# Patient Record
Sex: Female | Born: 1945 | Race: Black or African American | Hispanic: No | Smoking: Former smoker
Health system: Southern US, Community
[De-identification: ages and names within clinical notes are randomized; demographics above are authoritative.]

## PROBLEM LIST (undated history)

## (undated) DIAGNOSIS — I639 Cerebral infarction, unspecified: Secondary | ICD-10-CM

## (undated) DIAGNOSIS — M1990 Unspecified osteoarthritis, unspecified site: Secondary | ICD-10-CM

## (undated) DIAGNOSIS — E785 Hyperlipidemia, unspecified: Secondary | ICD-10-CM

## (undated) DIAGNOSIS — W19XXXA Unspecified fall, initial encounter: Secondary | ICD-10-CM

## (undated) DIAGNOSIS — E039 Hypothyroidism, unspecified: Secondary | ICD-10-CM

## (undated) DIAGNOSIS — D649 Anemia, unspecified: Secondary | ICD-10-CM

## (undated) HISTORY — PX: NO PAST SURGERIES: SHX2092

---

## 2001-09-26 ENCOUNTER — Ambulatory Visit (HOSPITAL_COMMUNITY): Admission: RE | Admit: 2001-09-26 | Discharge: 2001-09-26 | Payer: Self-pay | Admitting: *Deleted

## 2001-10-17 ENCOUNTER — Encounter (INDEPENDENT_AMBULATORY_CARE_PROVIDER_SITE_OTHER): Payer: Self-pay | Admitting: Internal Medicine

## 2001-10-17 ENCOUNTER — Ambulatory Visit (HOSPITAL_COMMUNITY): Admission: RE | Admit: 2001-10-17 | Discharge: 2001-10-17 | Payer: Self-pay | Admitting: Internal Medicine

## 2001-12-10 ENCOUNTER — Ambulatory Visit (HOSPITAL_COMMUNITY): Admission: RE | Admit: 2001-12-10 | Discharge: 2001-12-10 | Payer: Self-pay | Admitting: *Deleted

## 2002-10-06 ENCOUNTER — Ambulatory Visit (HOSPITAL_COMMUNITY): Admission: RE | Admit: 2002-10-06 | Discharge: 2002-10-06 | Payer: Self-pay | Admitting: Pulmonary Disease

## 2003-10-14 ENCOUNTER — Ambulatory Visit (HOSPITAL_COMMUNITY): Admission: RE | Admit: 2003-10-14 | Discharge: 2003-10-14 | Payer: Self-pay | Admitting: Pulmonary Disease

## 2005-12-03 ENCOUNTER — Emergency Department (HOSPITAL_COMMUNITY): Admission: EM | Admit: 2005-12-03 | Discharge: 2005-12-03 | Payer: Self-pay | Admitting: Emergency Medicine

## 2006-09-02 ENCOUNTER — Emergency Department (HOSPITAL_COMMUNITY): Admission: EM | Admit: 2006-09-02 | Discharge: 2006-09-02 | Payer: Self-pay | Admitting: Emergency Medicine

## 2008-05-08 ENCOUNTER — Emergency Department (HOSPITAL_COMMUNITY): Admission: EM | Admit: 2008-05-08 | Discharge: 2008-05-08 | Payer: Self-pay | Admitting: Emergency Medicine

## 2011-12-15 DIAGNOSIS — I639 Cerebral infarction, unspecified: Secondary | ICD-10-CM

## 2011-12-15 HISTORY — DX: Cerebral infarction, unspecified: I63.9

## 2011-12-24 ENCOUNTER — Emergency Department (HOSPITAL_COMMUNITY): Payer: Medicare Other

## 2011-12-24 ENCOUNTER — Encounter (HOSPITAL_COMMUNITY): Payer: Self-pay | Admitting: Emergency Medicine

## 2011-12-24 ENCOUNTER — Inpatient Hospital Stay (HOSPITAL_COMMUNITY)
Admission: EM | Admit: 2011-12-24 | Discharge: 2011-12-27 | DRG: 069 | Disposition: A | Discharge status: Home / Self Care | Payer: Medicare Other | Attending: Pulmonary Disease | Admitting: Pulmonary Disease

## 2011-12-24 ENCOUNTER — Other Ambulatory Visit: Payer: Self-pay

## 2011-12-24 DIAGNOSIS — E860 Dehydration: Secondary | ICD-10-CM | POA: Diagnosis present

## 2011-12-24 DIAGNOSIS — R5381 Other malaise: Secondary | ICD-10-CM | POA: Diagnosis not present

## 2011-12-24 DIAGNOSIS — I6330 Cerebral infarction due to thrombosis of unspecified cerebral artery: Secondary | ICD-10-CM | POA: Diagnosis not present

## 2011-12-24 DIAGNOSIS — R197 Diarrhea, unspecified: Secondary | ICD-10-CM | POA: Diagnosis not present

## 2011-12-24 DIAGNOSIS — I651 Occlusion and stenosis of basilar artery: Secondary | ICD-10-CM | POA: Diagnosis not present

## 2011-12-24 DIAGNOSIS — R5383 Other fatigue: Secondary | ICD-10-CM | POA: Diagnosis not present

## 2011-12-24 DIAGNOSIS — G459 Transient cerebral ischemic attack, unspecified: Secondary | ICD-10-CM | POA: Diagnosis not present

## 2011-12-24 DIAGNOSIS — K5289 Other specified noninfective gastroenteritis and colitis: Secondary | ICD-10-CM | POA: Diagnosis not present

## 2011-12-24 DIAGNOSIS — R209 Unspecified disturbances of skin sensation: Secondary | ICD-10-CM | POA: Diagnosis not present

## 2011-12-24 DIAGNOSIS — E039 Hypothyroidism, unspecified: Secondary | ICD-10-CM | POA: Diagnosis not present

## 2011-12-24 DIAGNOSIS — R109 Unspecified abdominal pain: Secondary | ICD-10-CM | POA: Diagnosis not present

## 2011-12-24 DIAGNOSIS — M1990 Unspecified osteoarthritis, unspecified site: Secondary | ICD-10-CM | POA: Diagnosis present

## 2011-12-24 DIAGNOSIS — E785 Hyperlipidemia, unspecified: Secondary | ICD-10-CM | POA: Diagnosis present

## 2011-12-24 DIAGNOSIS — R112 Nausea with vomiting, unspecified: Secondary | ICD-10-CM | POA: Diagnosis not present

## 2011-12-24 DIAGNOSIS — R269 Unspecified abnormalities of gait and mobility: Secondary | ICD-10-CM | POA: Diagnosis present

## 2011-12-24 DIAGNOSIS — R29898 Other symptoms and signs involving the musculoskeletal system: Secondary | ICD-10-CM | POA: Diagnosis present

## 2011-12-24 DIAGNOSIS — R1110 Vomiting, unspecified: Secondary | ICD-10-CM

## 2011-12-24 DIAGNOSIS — I639 Cerebral infarction, unspecified: Secondary | ICD-10-CM

## 2011-12-24 DIAGNOSIS — I6529 Occlusion and stenosis of unspecified carotid artery: Secondary | ICD-10-CM | POA: Diagnosis not present

## 2011-12-24 DIAGNOSIS — I6350 Cerebral infarction due to unspecified occlusion or stenosis of unspecified cerebral artery: Secondary | ICD-10-CM | POA: Diagnosis not present

## 2011-12-24 HISTORY — DX: Unspecified osteoarthritis, unspecified site: M19.90

## 2011-12-24 HISTORY — DX: Hyperlipidemia, unspecified: E78.5

## 2011-12-24 LAB — BASIC METABOLIC PANEL
BUN: 22 mg/dL (ref 6–23)
CO2: 24 mEq/L (ref 19–32)
Chloride: 99 mEq/L (ref 96–112)
Creatinine, Ser: 1.15 mg/dL — ABNORMAL HIGH (ref 0.50–1.10)
GFR calc Af Amer: 56 mL/min — ABNORMAL LOW (ref >90)
Potassium: 3.9 mEq/L (ref 3.5–5.1)

## 2011-12-24 LAB — CBC
HCT: 35.1 % — ABNORMAL LOW (ref 36.0–46.0)
Hemoglobin: 11 g/dL — ABNORMAL LOW (ref 12.0–15.0)
MCHC: 31.3 g/dL (ref 30.0–36.0)
RBC: 3.74 MIL/uL — ABNORMAL LOW (ref 3.87–5.11)
RDW: 14.9 % (ref 11.5–15.5)

## 2011-12-24 LAB — URINALYSIS, ROUTINE W REFLEX MICROSCOPIC
Glucose, UA: NEGATIVE mg/dL
Leukocytes, UA: NEGATIVE
Nitrite: NEGATIVE
Protein, ur: NEGATIVE mg/dL
pH: 5.5 (ref 5.0–8.0)

## 2011-12-24 LAB — DIFFERENTIAL
Eosinophils Absolute: 0 10*3/uL (ref 0.0–0.7)
Eosinophils Relative: 0 % (ref 0–5)
Lymphocytes Relative: 4 % — ABNORMAL LOW (ref 12–46)
Monocytes Absolute: 0.2 10*3/uL (ref 0.1–1.0)
Neutrophils Relative %: 94 % — ABNORMAL HIGH (ref 43–77)

## 2011-12-24 MED ORDER — ONDANSETRON HCL 4 MG PO TABS
4.0000 mg | ORAL_TABLET | ORAL | Status: DC | PRN
Start: 2011-12-24 — End: 2011-12-27

## 2011-12-24 MED ORDER — SODIUM CHLORIDE 0.9 % IV SOLN
INTRAVENOUS | Status: DC
  Administered 2011-12-24 – 2011-12-27 (×3): via INTRAVENOUS

## 2011-12-24 MED ORDER — ONDANSETRON HCL 4 MG/2ML IJ SOLN
4.0000 mg | INTRAMUSCULAR | Status: DC | PRN
  Administered 2011-12-24: 4 mg via INTRAVENOUS
  Filled 2011-12-24: qty 2

## 2011-12-24 MED ORDER — PNEUMOCOCCAL VAC POLYVALENT 25 MCG/0.5ML IJ INJ
0.5000 mL | INJECTION | INTRAMUSCULAR | Status: AC
  Administered 2011-12-25: 0.5 mL via INTRAMUSCULAR
  Filled 2011-12-24: qty 0.5

## 2011-12-24 MED ORDER — ONDANSETRON HCL 4 MG/2ML IJ SOLN
4.0000 mg | Freq: Once | INTRAMUSCULAR | Status: AC
  Administered 2011-12-24: 4 mg via INTRAVENOUS
  Filled 2011-12-24: qty 2

## 2011-12-24 MED ORDER — NAPROXEN 250 MG PO TABS: 250.0000 mg | ORAL_TABLET | Freq: Three times a day (TID) | ORAL | Status: DC | PRN

## 2011-12-24 MED ORDER — DIPHENHYDRAMINE HCL 25 MG PO CAPS
25.0000 mg | ORAL_CAPSULE | Freq: Four times a day (QID) | ORAL | Status: DC | PRN
  Filled 2011-12-24: qty 1

## 2011-12-24 MED ORDER — ASPIRIN 81 MG PO CHEW
81.0000 mg | CHEWABLE_TABLET | Freq: Every day | ORAL | Status: DC
  Administered 2011-12-24 – 2011-12-27 (×4): 81 mg via ORAL
  Filled 2011-12-24 (×4): qty 1

## 2011-12-24 MED ORDER — ADULT MULTIVITAMIN W/MINERALS CH
1.0000 | ORAL_TABLET | Freq: Every day | ORAL | Status: DC
  Administered 2011-12-24 – 2011-12-27 (×4): 1 via ORAL
  Filled 2011-12-24 (×4): qty 1

## 2011-12-24 MED ORDER — SODIUM CHLORIDE 0.9 % IV SOLN
Freq: Once | INTRAVENOUS | Status: AC
  Administered 2011-12-24: 1000 mL via INTRAVENOUS

## 2011-12-24 MED ORDER — ENOXAPARIN SODIUM 40 MG/0.4ML ~~LOC~~ SOLN
40.0000 mg | SUBCUTANEOUS | Status: DC
  Administered 2011-12-24 – 2011-12-26 (×3): 40 mg via SUBCUTANEOUS
  Filled 2011-12-24 (×3): qty 0.4

## 2011-12-24 MED ORDER — SODIUM CHLORIDE 0.9 % IJ SOLN
INTRAMUSCULAR | Status: AC
  Administered 2011-12-24: 10 mL
  Filled 2011-12-24: qty 3

## 2011-12-24 MED ORDER — LOPERAMIDE HCL 2 MG PO CAPS
4.0000 mg | ORAL_CAPSULE | Freq: Once | ORAL | Status: AC
  Administered 2011-12-24: 4 mg via ORAL
  Filled 2011-12-24: qty 2

## 2011-12-24 MED ORDER — ACETAMINOPHEN 500 MG PO TABS
1000.0000 mg | ORAL_TABLET | Freq: Four times a day (QID) | ORAL | Status: DC | PRN
  Administered 2011-12-24 – 2011-12-26 (×3): 1000 mg via ORAL
  Filled 2011-12-24 (×3): qty 2

## 2011-12-24 MED ORDER — ONDANSETRON HCL 4 MG/2ML IJ SOLN
4.0000 mg | INTRAMUSCULAR | Status: DC | PRN
  Administered 2011-12-24 – 2011-12-26 (×4): 4 mg via INTRAVENOUS
  Filled 2011-12-24 (×4): qty 2

## 2011-12-24 NOTE — Progress Notes (Signed)
NAME:  Alexis Ross, Alexis Ross             ACCOUNT NO.:  [id number]  MEDICAL RECORD NO.:  [id number]  LOCATION:  A320                          FACILITY:  [facility]  PHYSICIAN:  Lino Wickliff D. Felecia Shelling, MD   DATE OF BIRTH:  1946-01-10  DATE OF PROCEDURE: DATE OF DISCHARGE:                                PROGRESS NOTE   SUBJECTIVE:  Left-sided weakness, nausea, and vomiting.  HISTORY OF PRESENT ILLNESS:  This is a 66 year old female patient with history of hyperlipidemia and osteoarthritis, came to emergency room with above complaint.  She presented with the complaint of left-sided weakness of over 12 hours.  She also reported to have nausea, vomiting, and diarrhea.  The patient was evaluated in the emergency room and CT scan was negative.  Her baseline labs were within the normal limits. The patient, however, was admitted for further evaluation.  REVIEW OF SYSTEMS:  No fever, chills, cough, chest pain, shortness of breath, abdominal pain, dysuria, urgency, or frequency of urination.  PAST MEDICAL HISTORY: 1. Osteoarthritis. 2. Hyperlipidemia.  CURRENT MEDICATIONS:  Home medications: 1. Tylenol 1000 mg q.6 hours p.r.n. 2. Benadryl p.r.n. for allergy. 3. Multivitamin 1 tablet daily. 4. Naproxen 220 mg q.8 hours p.r.n.  SOCIAL HISTORY:  The patient has history of remote tobacco smoke.  No history of alcohol or substance abuse.  FAMILY HISTORY:  No known major illness in the family.  PHYSICAL EXAMINATION:  GENERAL/VITAL SIGNS:  The patient is alert, awake, and sick looking with vitals blood pressure 102/57, pulse 87, respiratory rate 18, temperature 98.22 degrees Fahrenheit. HEENT:  Pupils are equal and reactive. NECK:  Supple. CHEST:  Clear lung fields, good air entry. CARDIOVASCULAR SYSTEM:  First and second heart sounds heard.  No murmur, no gallop. ABDOMEN:  Soft and lax.  Bowel sound is positive.  No mass or organomegaly. EXTREMITIES:  The patient has mild left lower extremity  weakness.  LABORATORY DATA ON ADMISSION:  Troponin 0.3.  BMP, sodium 136, potassium 3.9, chloride 99, carbon dioxide 24, glucose 113, BUN 12, creatinine 1.15, calcium 9.9.  CBC, WBC 8.1, hemoglobin 11.0, hematocrit 35.1, and platelets 251.  ASSESSMENT: 1. Left-sided weakness, to rule out stroke and evaluation versus     transient ischemic attack. 2. Gastroenteritis. 3. History of hyperlipidemia.  PLAN:  We will admit the patient under telemetry.  We will do neuro checks.  We will schedule for MRI of the brain.  We will keep the patient on clear liquid diet.  We will gradually rehydrate the patient.     Daisi Kentner D. Felecia Shelling, MD     TDF/MEDQ  D:  12/24/2011  T:  12/24/2011  Job:  [id number]

## 2011-12-24 NOTE — ED Notes (Signed)
Pt given water to drink. 

## 2011-12-24 NOTE — ED Notes (Signed)
Pt reports started feeling nauseated last night then progressed to abd pain, vomiting, and diarrhea.  Reports approx 10pm last night left side of body went numb for approx an hour.  Pt's left leg weaker than right .  Grips equal.  Pupils equal and reactive, face symmetrical.  PT pleasant.

## 2011-12-24 NOTE — ED Notes (Signed)
Attempted to assess how pt swallowed a cracker.  Pt ate the crackers and swallowed without difficulty but vomited a few minutes later.  No change in lung sounds.

## 2011-12-24 NOTE — ED Notes (Signed)
Patient c/o emesis, diarrhea, and abdominal pain since last night. Patient report chills as well.

## 2011-12-24 NOTE — ED Provider Notes (Cosign Needed Addendum)
History  This chart was scribed for Ward Givens, MD by Bennett Scrape. This patient was seen in room [facility] and the patient's care was started at 11:16AM.  CSN: [id number]  Arrival date & time 12/24/11  1012   First MD Initiated Contact with Patient 12/24/11 1043      Chief Complaint  Patient presents with  . Emesis    Patient is a 66 y.o. female presenting with abdominal pain. The history is provided by the patient. No language interpreter was used.  Abdominal Pain The primary symptoms of the illness include abdominal pain. The current episode started 13 to 24 hours ago. The onset of the illness was gradual. The problem has been gradually improving.  The abdominal pain began 13 to24 hours ago. The pain came on gradually. The abdominal pain has been gradually improving since its onset. The abdominal pain is generalized. The abdominal pain does not radiate. The abdominal pain is relieved by nothing.  The patient states that she believes she is currently not pregnant. Significant associated medical issues do not include inflammatory bowel disease, diabetes or diverticulitis.    Alexis Ross is a 66 y.o. female who presents to the Emergency Department relating about 5:30 yesterday evening she started feeling nauseated, then had some non-radiating generalized abdominal pain followed by diarrhea. She states the abdominal pain gets worse when she needs to have diarrhea which is watery.  She lists emesis, diarrhea and mild HA as associated symptoms. She reports taking 2 tylenol with mild improvement in symptoms (headache). She denies any modifying factors. She also states about 11 pm she had an  episode of left-sided numbness and weaknesswith associated facial aches and chills that lasted for over one hour. She states that she could not move her left arm or left leg during that time. She denies numbness or weakness in her face.  She denies having any previous episodes of similar numbness.  She reports that her left leg is still weak but that her left hand is close to baseline. She states that she needed assistance walking to the bathroom which is not at baseline for her. She denies chest pain and fever as associated symptoms. She does report she had chills and had to hold her teeth clamped shut so they wouldn't clatter. States she was "freezing". She denies having any sick contacts at home with similar symptoms. .    Pt's PCP is Dr. Juanetta Gosling.   Past Medical History  Diagnosis Date  . Hyperlipidemia     History reviewed. No pertinent past surgical history.  No family history on file. Pt states that brother had brain CA.  History  Substance Use Topics  . Smoking status: Former Games developer  . Smokeless tobacco: Not on file  . Alcohol Use: No  lives at home alone  Review of Systems  Gastrointestinal: Positive for abdominal pain.    Allergies  Review of patient's allergies indicates no known allergies.  Home Medications   Current Outpatient Rx  Name Route Sig Dispense Refill  . ACETAMINOPHEN 500 MG PO TABS Oral Take 1,000 mg by mouth every 6 (six) hours as needed. For pain    . DIPHENHYDRAMINE HCL 25 MG PO TABS Oral Take 25 mg by mouth every 6 (six) hours as needed. For allergies    . ADULT MULTIVITAMIN W/MINERALS CH Oral Take 1 tablet by mouth daily.    Marland Kitchen NAPROXEN SODIUM 220 MG PO TABS Oral Take 220 mg by mouth every 8 (eight) hours as needed.  Arthritis pain      Triage Vitals: BP 153/70  Pulse 99  Temp(Src) 98.6 F (37 C) (Oral)  Resp 18  Ht 5\' 6"  (1.676 m)  Wt 185 lb (83.915 kg)  BMI 29.86 kg/m2  SpO2 100%  Vital signs normal    Physical Exam  Nursing note and vitals reviewed. Constitutional: She is oriented to person, place, and time. She appears well-developed and well-nourished.  HENT:  Head: Normocephalic and atraumatic.  Right Ear: External ear normal.  Left Ear: External ear normal.       Dry mucous membranes  Eyes: Conjunctivae and EOM are  normal.  Neck: Normal range of motion. Neck supple.  Cardiovascular: Normal rate, regular rhythm, normal heart sounds and intact distal pulses.  Exam reveals no gallop and no friction rub.   No murmur heard. Pulmonary/Chest: Effort normal and breath sounds normal. No respiratory distress. She has no wheezes. She has no rales. She exhibits no tenderness.  Abdominal: Soft. Bowel sounds are normal. She exhibits no distension. There is tenderness. There is no rebound and no guarding.       Mild diffuse especially lower abdomen no guarding or rebound  Musculoskeletal: Normal range of motion. She exhibits no edema.  Neurological: She is alert and oriented to person, place, and time. No cranial nerve deficit.       Patient has mild pronator drift on the left. Patient has difficulty holding her left leg up against gravity and also maintaining it for calcified. She does not have Babinski on either side. She has no obvious facial cranial nerve deficit.  Skin: Skin is warm and dry.  Psychiatric: She has a normal mood and affect. Her behavior is normal.    ED Course  Procedures (including critical care time)   Medications  ondansetron (ZOFRAN) injection 4 mg (4 mg Intravenous Given 12/24/11 1203)  ondansetron (ZOFRAN) injection 4 mg (not administered)  loperamide (IMODIUM) capsule 4 mg (4 mg Oral Given 12/24/11 1203)  0.9 %  sodium chloride infusion (1000 mL Intravenous New Bag/Given 12/24/11 1204)      DIAGNOSTIC STUDIES: Oxygen Saturation is 100% on room air, normal by my interpretation.    COORDINATION OF CARE: 11:30AM-Discussed treatment plan with pt and pt agreed to plan.  1:05PM-Pt rechecked and is feeling better. Dicussed possible admission with pt and pt agree to admission.  2:09PM-Discussed admission to the hospital and pt agreed. She will be admitted under Dr. Felecia Shelling. Will continue to treat emesis and diarrhea in the meantime. A MRI is scheduled for tomorrow morning.  14:08 Dr Felecia Shelling  will admit here and get MRI tomorrow   Results for orders placed during the hospital encounter of 12/24/11  CBC      Component Value Range   WBC 8.1  4.0 - 10.5 (K/uL)   RBC 3.74 (*) 3.87 - 5.11 (MIL/uL)   Hemoglobin 11.0 (*) 12.0 - 15.0 (g/dL)   HCT 16.1 (*) 09.6 - 46.0 (%)   MCV 93.9  78.0 - 100.0 (fL)   MCH 29.4  26.0 - 34.0 (pg)   MCHC 31.3  30.0 - 36.0 (g/dL)   RDW 04.5  40.9 - 81.1 (%)   Platelets 251  150 - 400 (K/uL)  DIFFERENTIAL      Component Value Range   Neutrophils Relative 94 (*) 43 - 77 (%)   Neutro Abs 7.6  1.7 - 7.7 (K/uL)   Lymphocytes Relative 4 (*) 12 - 46 (%)   Lymphs Abs 0.3 (*) 0.7 -  4.0 (K/uL)   Monocytes Relative 2 (*) 3 - 12 (%)   Monocytes Absolute 0.2  0.1 - 1.0 (K/uL)   Eosinophils Relative 0  0 - 5 (%)   Eosinophils Absolute 0.0  0.0 - 0.7 (K/uL)   Basophils Relative 0  0 - 1 (%)   Basophils Absolute 0.0  0.0 - 0.1 (K/uL)  BASIC METABOLIC PANEL      Component Value Range   Sodium 136  135 - 145 (mEq/L)   Potassium 3.9  3.5 - 5.1 (mEq/L)   Chloride 99  96 - 112 (mEq/L)   CO2 24  19 - 32 (mEq/L)   Glucose, Bld 113 (*) 70 - 99 (mg/dL)   BUN 22  6 - 23 (mg/dL)   Creatinine, Ser 4.09 (*) 0.50 - 1.10 (mg/dL)   Calcium 9.9  8.4 - 81.1 (mg/dL)   GFR calc non Af Amer 48 (*) >90 (mL/min)   GFR calc Af Amer 56 (*) >90 (mL/min)  URINALYSIS, ROUTINE W REFLEX MICROSCOPIC      Component Value Range   Color, Urine YELLOW  YELLOW    APPearance CLEAR  CLEAR    Specific Gravity, Urine 1.025  1.005 - 1.030    pH 5.5  5.0 - 8.0    Glucose, UA NEGATIVE  NEGATIVE (mg/dL)   Hgb urine dipstick NEGATIVE  NEGATIVE    Bilirubin Urine NEGATIVE  NEGATIVE    Ketones, ur NEGATIVE  NEGATIVE (mg/dL)   Protein, ur NEGATIVE  NEGATIVE (mg/dL)   Urobilinogen, UA 0.2  0.0 - 1.0 (mg/dL)   Nitrite NEGATIVE  NEGATIVE    Leukocytes, UA NEGATIVE  NEGATIVE   APTT      Component Value Range   aPTT 26  24 - 37 (seconds)  PROTIME-INR      Component Value Range   Prothrombin  Time 14.7  11.6 - 15.2 (seconds)   INR 1.13  0.00 - 1.49   TROPONIN I      Component Value Range   Troponin I <0.30  <0.30 (ng/mL)    Laboratory interpretation all normal except mild anemia, renal insuffic   Ct Head Wo Contrast  12/24/2011  *RADIOLOGY REPORT*  Clinical Data: Numbness and weakness.  CT HEAD WITHOUT CONTRAST  Technique:  Contiguous axial images were obtained from the base of the skull through the vertex without contrast.  Comparison: None.  Findings: The ventricles are in the midline without mass effect or shift.  They are normal in size and configuration for age.  No extra-axial fluid collections are identified.  Mild patchy periventricular white matter disease is likely microvascular ischemic change.  No definite CT findings for acute hemispheric infarction or intracranial hemorrhage.  The brainstem and cerebellum grossly normal.  No mass lesions.  The bony structures are intact.  The paranasal sinuses and mastoid air cells are clear.  The globes are intact.  IMPRESSION: No acute intracranial findings or mass lesions.  Original Report Authenticated By: P. Loralie Champagne, M.D.    Date: 12/24/2011  Rate: 93  Rhythm: normal sinus rhythm  QRS Axis: normal  Intervals: normal  ST/T Wave abnormalities: nonspecific T wave changes  Conduction Disutrbances:none  Narrative Interpretation:   Old EKG Reviewed: none available      1. Stroke   2. Vomiting and diarrhea   3. Dehydration       MDM   I personally performed the services described in this documentation, which was scribed in my presence. The recorded information has been reviewed  and considered. Devoria Albe, MD, FACEP          Ward Givens, MD 12/24/11 1527  Ward Givens, MD 12/24/11 [phone]

## 2011-12-25 ENCOUNTER — Inpatient Hospital Stay (HOSPITAL_COMMUNITY): Payer: Medicare Other

## 2011-12-25 LAB — LIPID PANEL
HDL: 71 mg/dL (ref >39)
LDL Cholesterol: 262 mg/dL — ABNORMAL HIGH (ref 0–99)
Total CHOL/HDL Ratio: 5.1 RATIO
Triglycerides: 135 mg/dL (ref <150)
VLDL: 27 mg/dL (ref 0–40)

## 2011-12-25 MED ORDER — PANTOPRAZOLE SODIUM 40 MG PO PACK
20.0000 mg | PACK | Freq: Every day | ORAL | Status: DC
  Administered 2011-12-25 – 2011-12-26 (×2): 20 mg
  Filled 2011-12-25 (×5): qty 20

## 2011-12-25 MED ORDER — PANTOPRAZOLE SODIUM 20 MG PO TBEC
20.0000 mg | DELAYED_RELEASE_TABLET | Freq: Every day | ORAL | Status: DC
  Filled 2011-12-25: qty 1

## 2011-12-25 MED ORDER — LOPERAMIDE HCL 2 MG PO CAPS
2.0000 mg | ORAL_CAPSULE | ORAL | Status: DC | PRN
  Administered 2011-12-25: 2 mg via ORAL
  Filled 2011-12-25: qty 1

## 2011-12-25 MED ORDER — SODIUM CHLORIDE 0.9 % IJ SOLN
INTRAMUSCULAR | Status: AC
  Administered 2011-12-25: 19:00:00
  Filled 2011-12-25: qty 3

## 2011-12-25 MED ORDER — PROMETHAZINE HCL 25 MG/ML IJ SOLN
12.5000 mg | INTRAMUSCULAR | Status: DC | PRN
  Administered 2011-12-25 – 2011-12-26 (×2): 12.5 mg via INTRAVENOUS
  Filled 2011-12-25 (×2): qty 1

## 2011-12-25 MED ORDER — PROMETHAZINE HCL 25 MG RE SUPP: 25.0000 mg | Freq: Once | RECTAL | Status: DC

## 2011-12-25 NOTE — Evaluation (Signed)
Physical Therapy Evaluation Patient Details Name: Alexis Ross MRN: [id number] DOB: 1946-02-12 Today's Date: 12/25/2011  Problem List: There is no problem list on file for this patient.   Past Medical History:  Past Medical History  Diagnosis Date  . Hyperlipidemia   . Arthritis    Past Surgical History: History reviewed. No pertinent past surgical history.  PT Assessment/Plan/Recommendation PT Assessment Clinical Impression Statement: very pleasant and cooperative pt who had been totally independent PTA...now with significant L hemiparesis and difficulty walking...recommend SNF at d/c ...(might be suitable for inpatient rehab) PT Recommendation/Assessment: Patient will need skilled PT in the acute care venue PT Problem List: Decreased strength;Decreased activity tolerance;Decreased mobility;Decreased coordination;Decreased knowledge of use of DME;Decreased safety awareness Barriers to Discharge: Decreased caregiver support Barriers to Discharge Comments: has supportive daughter, but is employed and not always available to assist pt PT Therapy Diagnosis : Abnormality of gait;Hemiplegia dominant side PT Plan PT Frequency: Min 5X/week PT Treatment/Interventions: DME instruction;Gait training;Functional mobility training;Therapeutic activities;Therapeutic exercise;Neuromuscular re-education;Patient/family education PT Recommendation Follow Up Recommendations: Inpatient Rehab;Skilled nursing facility Equipment Recommended: Defer to next venue PT Goals  Acute Rehab PT Goals PT Goal Formulation: With patient Pt will Ambulate: 51 - 150 feet;with supervision;with least restrictive assistive device PT Goal: Ambulate - Progress: Goal set today  PT Evaluation Precautions/Restrictions  Precautions Precautions: Fall Required Braces or Orthoses: No Restrictions Weight Bearing Restrictions: No Prior Functioning  Home Living Lives With: Alone Receives Help From: Family Type of  Home: Apartment Home Layout: One level Home Access: Level entry Firefighter: Standard Bathroom Accessibility: Yes How Accessible: Accessible via walker Home Adaptive Equipment: None Prior Function Level of Independence: Independent with basic ADLs;Independent with gait;Independent with transfers;Independent with homemaking with ambulation Driving: No Vocation: Retired Producer, television/film/video: Awake/alert Overall Cognitive Status: Appears within functional limits for tasks assessed Orientation Level: Oriented X4 Sensation/Coordination Sensation Light Touch: Appears Intact Stereognosis: Not tested Hot/Cold: Not tested Proprioception: Appears Intact Coordination Gross Motor Movements are Fluid and Coordinated: No Fine Motor Movements are Fluid and Coordinated: Not tested Coordination and Movement Description: mild decrease in coordination of L estremeties...very slow patterns of motion Finger Nose Finger Test: slight misplacement LUE Heel Shin Test: NT Extremity Assessment RUE Assessment RUE Assessment: Within Functional Limits LUE Assessment LUE Assessment: Not tested RLE Assessment RLE Assessment: Within Functional Limits LLE Strength LLE Overall Strength Comments: 3-/5 Mobility (including Balance) Bed Mobility Bed Mobility: No Transfers Transfers: Yes Sit to Stand: 6: Modified independent (Device/Increase time) Stand to Sit: 6: Modified independent (Device/Increase time) Ambulation/Gait Ambulation/Gait: Yes Ambulation/Gait Assistance: 4: Min assist Ambulation/Gait Assistance Details (indicate cue type and reason): has difficulty moving LLE at times with very slow patterns of motion...decreased LLE awareness Ambulation Distance (Feet): 30 Feet Assistive device: Rolling walker Gait Pattern: Decreased hip/knee flexion - left;Trunk flexed Gait velocity: very slow and labored gait Stairs: No Wheelchair Mobility Wheelchair Mobility: No    Posture/Postural Control Posture/Postural Control: No significant limitations Balance Balance Assessed: Yes Static Sitting Balance Static Sitting - Level of Assistance: 7: Independent Exercise    End of Session PT - End of Session Equipment Utilized During Treatment: Gait belt Activity Tolerance: Patient tolerated treatment well Patient left: in chair;with call bell in reach;with family/visitor present General Behavior During Session: [facility] for tasks performed Cognition: [facility] for tasks performed  Konrad Penta 12/25/2011, 10:48 AM

## 2011-12-25 NOTE — Consult Note (Signed)
Reason for Consult: Referring Physician:   JERYN Ross is an 66 y.o. female.  HPI:  Past Medical History  Diagnosis Date  . Hyperlipidemia   . Arthritis     History reviewed. No pertinent past surgical history.  No family history on file.  Social History:  reports that she has quit smoking. She does not have any smokeless tobacco history on file. She reports that she does not drink alcohol or use illicit drugs.  Allergies: No Known Allergies  Medications:  Prior to Admission medications   Medication Sig Start Date End Date Taking? Authorizing Provider  acetaminophen (TYLENOL) 500 MG tablet Take 1,000 mg by mouth every 6 (six) hours as needed. For pain   Yes Historical Provider, MD  diphenhydrAMINE (BENADRYL) 25 MG tablet Take 25 mg by mouth every 6 (six) hours as needed. For allergies   Yes Historical Provider, MD  Multiple Vitamin (MULITIVITAMIN WITH MINERALS) TABS Take 1 tablet by mouth daily.   Yes Historical Provider, MD  naproxen sodium (ANAPROX) 220 MG tablet Take 220 mg by mouth every 8 (eight) hours as needed. Arthritis pain   Yes Historical Provider, MD   Scheduled Meds:   . sodium chloride   Intravenous Once  . aspirin  81 mg Oral Daily  . enoxaparin (LOVENOX) injection  40 mg Subcutaneous Q24H  . loperamide  4 mg Oral Once  . mulitivitamin with minerals  1 tablet Oral Daily  . ondansetron  4 mg Intravenous Once  . pneumococcal 23 valent vaccine  0.5 mL Intramuscular Tomorrow-1000  . sodium chloride       Continuous Infusions:   . sodium chloride 75 mL/hr at 12/25/11 0631   PRN Meds:.acetaminophen, diphenhydrAMINE, naproxen, ondansetron, ondansetron, DISCONTD: ondansetron (ZOFRAN) IV'  Results for orders placed during the hospital encounter of 12/24/11 (from the past 48 hour(s))  CBC     Status: Abnormal   Collection Time   12/24/11 11:07 AM      Component Value Range Comment   WBC 8.1  4.0 - 10.5 (K/uL)    RBC 3.74 (*) 3.87 - 5.11 (MIL/uL)    Hemoglobin 11.0 (*) 12.0 - 15.0 (g/dL)    HCT 16.1 (*) 09.6 - 46.0 (%)    MCV 93.9  78.0 - 100.0 (fL)    MCH 29.4  26.0 - 34.0 (pg)    MCHC 31.3  30.0 - 36.0 (g/dL)    RDW 04.5  40.9 - 81.1 (%)    Platelets 251  150 - 400 (K/uL)   DIFFERENTIAL     Status: Abnormal   Collection Time   12/24/11 11:07 AM      Component Value Range Comment   Neutrophils Relative 94 (*) 43 - 77 (%)    Neutro Abs 7.6  1.7 - 7.7 (K/uL)    Lymphocytes Relative 4 (*) 12 - 46 (%)    Lymphs Abs 0.3 (*) 0.7 - 4.0 (K/uL)    Monocytes Relative 2 (*) 3 - 12 (%)    Monocytes Absolute 0.2  0.1 - 1.0 (K/uL)    Eosinophils Relative 0  0 - 5 (%)    Eosinophils Absolute 0.0  0.0 - 0.7 (K/uL)    Basophils Relative 0  0 - 1 (%)    Basophils Absolute 0.0  0.0 - 0.1 (K/uL)   BASIC METABOLIC PANEL     Status: Abnormal   Collection Time   12/24/11 11:07 AM      Component Value Range Comment   Sodium 136  135 - 145 (mEq/L)    Potassium 3.9  3.5 - 5.1 (mEq/L)    Chloride 99  96 - 112 (mEq/L)    CO2 24  19 - 32 (mEq/L)    Glucose, Bld 113 (*) 70 - 99 (mg/dL)    BUN 22  6 - 23 (mg/dL)    Creatinine, Ser 0.98 (*) 0.50 - 1.10 (mg/dL)    Calcium 9.9  8.4 - 10.5 (mg/dL)    GFR calc non Af Amer 48 (*) >90 (mL/min)    GFR calc Af Amer 56 (*) >90 (mL/min)   TROPONIN I     Status: Normal   Collection Time   12/24/11 11:07 AM      Component Value Range Comment   Troponin I <0.30  <0.30 (ng/mL)   URINALYSIS, ROUTINE W REFLEX MICROSCOPIC     Status: Normal   Collection Time   12/24/11 11:15 AM      Component Value Range Comment   Color, Urine YELLOW  YELLOW     APPearance CLEAR  CLEAR     Specific Gravity, Urine 1.025  1.005 - 1.030     pH 5.5  5.0 - 8.0     Glucose, UA NEGATIVE  NEGATIVE (mg/dL)    Hgb urine dipstick NEGATIVE  NEGATIVE     Bilirubin Urine NEGATIVE  NEGATIVE     Ketones, ur NEGATIVE  NEGATIVE (mg/dL)    Protein, ur NEGATIVE  NEGATIVE (mg/dL)    Urobilinogen, UA 0.2  0.0 - 1.0 (mg/dL)    Nitrite NEGATIVE   NEGATIVE     Leukocytes, UA NEGATIVE  NEGATIVE  MICROSCOPIC NOT DONE ON URINES WITH NEGATIVE PROTEIN, BLOOD, LEUKOCYTES, NITRITE, OR GLUCOSE <1000 mg/dL.  APTT     Status: Normal   Collection Time   12/24/11 11:33 AM      Component Value Range Comment   aPTT 26  24 - 37 (seconds)   PROTIME-INR     Status: Normal   Collection Time   12/24/11 11:33 AM      Component Value Range Comment   Prothrombin Time 14.7  11.6 - 15.2 (seconds)    INR 1.13  0.00 - 1.49      Ct Head Wo Contrast  12/24/2011  *RADIOLOGY REPORT*  Clinical Data: Numbness and weakness.  CT HEAD WITHOUT CONTRAST  Technique:  Contiguous axial images were obtained from the base of the skull through the vertex without contrast.  Comparison: None.  Findings: The ventricles are in the midline without mass effect or shift.  They are normal in size and configuration for age.  No extra-axial fluid collections are identified.  Mild patchy periventricular white matter disease is likely microvascular ischemic change.  No definite CT findings for acute hemispheric infarction or intracranial hemorrhage.  The brainstem and cerebellum grossly normal.  No mass lesions.  The bony structures are intact.  The paranasal sinuses and mastoid air cells are clear.  The globes are intact.  IMPRESSION: No acute intracranial findings or mass lesions.  Original Report Authenticated By: P. Loralie Champagne, M.D.    Review of Systems  Constitutional: Negative.   HENT: Negative.   Respiratory: Negative.   Genitourinary: Negative.   Musculoskeletal: Negative.   Skin: Negative.   Endo/Heme/Allergies: Negative.    Blood pressure 108/69, pulse 83, temperature 98.2 F (36.8 C), temperature source Oral, resp. rate 20, height 5\' 6"  (1.676 m), weight 83.915 kg (185 lb), SpO2 93.00%. Physical Exam  Assessment/Plan: See dictation  Saben Donigan 12/25/2011, 9:17  AM

## 2011-12-25 NOTE — Progress Notes (Signed)
   CARE MANAGEMENT NOTE 12/25/2011  Patient:  Alexis Ross, Alexis Ross   Account Number:  [id number]  Date Initiated:  12/25/2011  Documentation initiated by:  Sharrie Rothman  Subjective/Objective Assessment:   Pt admitted with diarrhea and vomiting. Pt lives alone at home.     Action/Plan:   CM spoke with pt. No needs at this time   Anticipated DC Date:  12/29/2011   Anticipated DC Plan:  HOME/SELF CARE      DC Planning Services  CM consult      Choice offered to / List presented to:             Status of service:  In process, will continue to follow Medicare Important Message given?   (If response is "NO", the following Medicare IM given date fields will be blank) Date Medicare IM given:   Date Additional Medicare IM given:    Discharge Disposition:  HOME/SELF CARE  Per UR Regulation:    Comments:  12/25/11 Alexis Queen, RN BSN Care Management Pt admitted with vomiting and diarrhea. Pt states no discharge needs at this time. CM will continue to follow.

## 2011-12-26 MED ORDER — SODIUM CHLORIDE 0.9 % IJ SOLN
INTRAMUSCULAR | Status: AC
  Administered 2011-12-26: 18:00:00
  Filled 2011-12-26: qty 3

## 2011-12-26 MED ORDER — CLOPIDOGREL BISULFATE 75 MG PO TABS
75.0000 mg | ORAL_TABLET | Freq: Every day | ORAL | Status: DC
  Administered 2011-12-27: 75 mg via ORAL
  Filled 2011-12-26: qty 1

## 2011-12-26 MED ORDER — LEVOTHYROXINE SODIUM 50 MCG PO TABS
50.0000 ug | ORAL_TABLET | Freq: Every day | ORAL | Status: DC
  Administered 2011-12-27: 50 ug via ORAL
  Filled 2011-12-26: qty 1

## 2011-12-26 MED ORDER — SIMVASTATIN 20 MG PO TABS
20.0000 mg | ORAL_TABLET | Freq: Every day | ORAL | Status: DC
  Administered 2011-12-26: 20 mg via ORAL
  Filled 2011-12-26: qty 1

## 2011-12-26 NOTE — Progress Notes (Signed)
Subjective: Interval History:  Objective: Vital signs in last 24 hours: Temp:  [97.8 F (36.6 C)-98.2 F (36.8 C)] 98.2 F (36.8 C) (03/12 0559) Pulse Rate:  [69-90] 90  (03/12 0559) Resp:  [20] 20  (03/12 0559) BP: (101-119)/(66-72) 101/66 mmHg (03/12 0559) SpO2:  [98 %-100 %] 98 % (03/12 0559)  Intake/Output from previous day: 03/11 0701 - 03/12 0700 In: 2117.5 [P.O.:1180; I.V.:937.5] Out: 1000 [Urine:1000] Intake/Output this shift:   Nutritional status: Clear Liquid    Lab Results:  Basename 12/24/11 1107  WBC 8.1  HGB 11.0*  HCT 35.1*  PLT 251  NA 136  K 3.9  CL 99  CO2 24  GLUCOSE 113*  BUN 22  CREATININE 1.15*  CALCIUM 9.9  LABA1C --   Lipid Panel  Basename 12/25/11 1030  CHOL 360*  TRIG 135  HDL 71  CHOLHDL 5.1  VLDL 27  LDLCALC 161*    Studies/Results: Ct Head Wo Contrast  12/24/2011  *RADIOLOGY REPORT*  Clinical Data: Numbness and weakness.  CT HEAD WITHOUT CONTRAST  Technique:  Contiguous axial images were obtained from the base of the skull through the vertex without contrast.  Comparison: None.  Findings: The ventricles are in the midline without mass effect or shift.  They are normal in size and configuration for age.  No extra-axial fluid collections are identified.  Mild patchy periventricular white matter disease is likely microvascular ischemic change.  No definite CT findings for acute hemispheric infarction or intracranial hemorrhage.  The brainstem and cerebellum grossly normal.  No mass lesions.  The bony structures are intact.  The paranasal sinuses and mastoid air cells are clear.  The globes are intact.  IMPRESSION: No acute intracranial findings or mass lesions.  Original Report Authenticated By: P. Loralie Champagne, M.D.   Mr Maxine Glenn Head Wo Contrast  12/25/2011  *RADIOLOGY REPORT*  Clinical Data: Left-sided weakness.  MRA HEAD WITHOUT CONTRAST  Technique: Angiographic images of the Circle of Willis were obtained using MRA technique without  intravenous contrast.  Comparison: CT head without contrast 12/24/2011  Findings: The study is mildly degraded by patient motion, decreasing sensitivity for subtle lesions.  Mild irregularity is noted within the cavernous carotid arteries bilaterally.  There is an infundibulum of the left posterior communicating artery.  The right posterior communicating artery is present.  The A1 and M1 segments are within normal limits.  The anterior communicating artery is patent.  Mild irregularity of MCA branch vessels is exaggerated by patient motion.  The left vertebral artery is the dominant vessel.  The PICA origins are just below the field of view bilaterally.  There is significant signal loss in the distal basilar artery, suggesting high-grade stenosis.  P1 segments are present bilaterally.  Prominent posterior communicating arteries are noted as well.  Segmental irregularity and signal loss is present in the posterior cerebral arteries bilaterally.  IMPRESSION:  1.  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 2.  Moderate small vessel disease.  This is likely exaggerated by artifact from patient motion.  Original Report Authenticated By: Jamesetta Orleans. MATTERN, M.D.   Mr Brain Wo Contrast  12/25/2011  *RADIOLOGY REPORT*  Clinical Data: Left-sided weakness over 12 hours.  Nausea vomiting and diarrhea.  Rule out CVA.  MRI HEAD WITHOUT CONTRAST  Technique:  Multiplanar, multiecho pulse sequences of the brain and surrounding structures were obtained according to standard protocol without intravenous contrast.  Comparison: CT head without contrast 12/24/2011  Findings: The diffusion  weighted images demonstrate no evidence for acute or subacute infarction.  Mild periventricular subcortical T2 and FLAIR hyperintensities are present bilaterally.  No hemorrhage or mass lesion is evident.  Flow is present in the major intracranial arteries.  The globes and orbits are intact.  The paranasal  sinuses and mastoid air cells are clear.  IMPRESSION:  1.  No acute intracranial abnormality. 2.  Periventricular subcortical white matter changes are advanced for age. The finding is nonspecific but can be seen in the setting of chronic microvascular ischemia, a demyelinating process such as multiple sclerosis, vasculitis, complicated migraine headaches, or as the sequelae of a prior infectious or inflammatory process.  Original Report Authenticated By: Jamesetta Orleans. MATTERN, M.D.   US Carotid Duplex Bilateral  12/25/2011  *RADIOLOGY REPORT*  Clinical Data: Stroke symptoms  BILATERAL CAROTID DUPLEX ULTRASOUND  Technique: Gray scale imaging, color Doppler and duplex ultrasound was performed of bilateral carotid and vertebral arteries in the neck.  Comparison:  None.  Criteria:  Quantification of carotid stenosis is based on velocity parameters that correlate the residual internal carotid diameter with NASCET-based stenosis levels, using the diameter of the distal internal carotid lumen as the denominator for stenosis measurement.  The following velocity measurements were obtained:                   PEAK SYSTOLIC/END DIASTOLIC RIGHT ICA:                        194/52cm/sec CCA:                        97/18cm/sec SYSTOLIC ICA/CCA RATIO:     2.0 DIASTOLIC ICA/CCA RATIO:    2.96 ECA:                        210cm/sec  LEFT ICA:                        114/39cm/sec CCA:                        126/26cm/sec SYSTOLIC ICA/CCA RATIO:     0.90 DIASTOLIC ICA/CCA RATIO:    1.50 ECA:                        109cm/sec  Findings:  RIGHT CAROTID ARTERY: Mild to moderate scattered heterogeneous right carotid plaque formation.  Right mid ICA velocity elevation measuring 194/52.  Slight spectral broadening and aliasing in this region.  By ultrasound criteria, thisright ICA narrowing is estimated at 50-69%.  RIGHT VERTEBRAL ARTERY:  Antegrade  LEFT CAROTID ARTERY: Mild scattered left carotid system atherosclerosis.  Despite this, there  is no hemodynamically significant left ICA stenosis, velocity elevation, or turbulent flow.  LEFT VERTEBRAL ARTERY:  Antegrade  IMPRESSION: Moderate right ICA stenosis estimated at 50-69%.  Left ICA narrowing less than 50%.  Patent antegrade vertebral flow bilaterally.  Original Report Authenticated By: Judie Petit. Ruel Favors, M.D.    Medications:  Scheduled Meds:   . aspirin  81 mg Oral Daily  . enoxaparin (LOVENOX) injection  40 mg Subcutaneous Q24H  . mulitivitamin with minerals  1 tablet Oral Daily  . pantoprazole sodium  20 mg Per Tube Q1200  . pneumococcal 23 valent vaccine  0.5 mL Intramuscular Tomorrow-1000  . promethazine  25 mg Rectal Once  . sodium chloride      .  DISCONTD: pantoprazole  20 mg Oral Q1200   Continuous Infusions:   . sodium chloride 75 mL/hr at 12/25/11 0631   PRN Meds:.acetaminophen, diphenhydrAMINE, loperamide, naproxen, ondansetron, ondansetron, promethazine   Assessment/Plan: See dictation   LOS: 2 days   Michael Ventresca

## 2011-12-26 NOTE — Consult Note (Signed)
NAMEYAFFA, SECKMAN             ACCOUNT NO.:  [id number]  MEDICAL RECORD NO.:  [id number]  LOCATION:  A320                          FACILITY:  [facility]  PHYSICIAN:  Sadat Sliwa A. Gerilyn Pilgrim, M.D. DATE OF BIRTH:  [birth year]  DATE OF CONSULTATION: DATE OF DISCHARGE:                                CONSULTATION   This is a 66 year old right-handed black female who essentially does not have lot of medical problems at baseline.  She developed relatively acute onset of epigastric discomfort, nausea, and vomiting.  She subsequently developed numbness and weakness on the left side involving the left face, arm, and leg.  She also reports having some chest discomfort with the abdominal discomfort.  No headaches are reported. Symptoms persisted and she decided to seek medical attention.  She reports some dizziness described mostly as gait instability/disequilibrium.  Again her symptoms have persisted.  She does not take any medications at home.  PHYSICAL EXAMINATION:  GENERAL:  Today shows average weight pleasant lady, in no acute distress.  HEENT:  Head is normocephalic, atraumatic. NECK:  Supple. ABDOMEN:  Soft with some mild soreness diffusely. EXTREMITIES:  Normal. NEUROLOGIC:  Mentation:  She is awake and alert.  Speech, language, and cognition are intact.  Cranial nerve evaluation shows slight flattening in nasolabial fold of the left.  Visual fields are intact.  Pupils are equal, round, reactive to light.  Facial muscle strength again shows normal straight other than nasolabial fold flattening on the left side. Tongue is midline.  Uvula midline.  Shoulder shrugs normal.  Motor examination does show mild downward drift, left upper extremity testing shows 4+ strength on triceps, deltoids, and hand grip on the left side.  Left leg, proximal hip flexion 4-, dorsiflexion 4-. Right side shows normal tone, bulk, and strength.  Reflexes are 2+. Plantars are both extensor.  Sensation normal to light  touch and temperature.  Coordination shows no dysmetria.  No tremors, no parkinsonism.  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 Risk factors, only age seen at this point.  She does not have a history of hypertension or diabetes.  RECOMMENDATIONS:  MRI, MRA, carotid duplex, Doppler, labs for lipid profile, thyroid function tests, homocystine level, B12 level, and RPR. Continue with aspirin level, physical and occupational therapy.    Haydin Dunn A. Gerilyn Pilgrim, M.D.    KAD/MEDQ  D:  12/25/2011  T:  12/26/2011  Job:  [id number]

## 2011-12-26 NOTE — Progress Notes (Signed)
Alexis Ross, Alexis Ross             ACCOUNT NO.:  [id number]  MEDICAL RECORD NO.:  [id number]  LOCATION:  A320                          FACILITY:  [facility]  PHYSICIAN:  Stancil Deisher L. Juanetta Gosling, M.D.DATE OF BIRTH:  08/26/1946  DATE OF PROCEDURE: DATE OF DISCHARGE:                                PROGRESS NOTE   Ms. Foutz was admitted with stroke-like symptoms of nausea, vomiting, and diarrhea.  She has continued to have some nausea and vomiting.  She is also still having some weakness on the left side, so I have told her I think we need to try to get her improved and workup to make sure that she does not have a stroke.  She understands.  PHYSICAL EXAMINATION:  GENERAL:  Today shows that she is awake and alert. CHEST:  Pretty clear. ABDOMEN:  Mildly tender diffusely with no rebound.  She has minimal left- sided weakness.  Assessment then she may have had a TIA.  She has nausea, vomiting, and diarrhea, and I suspect it is acute gastroenteritis.  My plan then is for her to have MRI.  I am going to ask for Neurological consultation.  I am going to change her medication and then depending on how she does, she may need Gastroenterology consultation as well.     Lorcan Shelp L. Juanetta Gosling, M.D.     ELH/MEDQ  D:  12/25/2011  T:  12/26/2011  Job:  [id number]

## 2011-12-26 NOTE — Progress Notes (Signed)
Physical Therapy Treatment Patient Details Name: Alexis Ross MRN: [id number] DOB: 1946-10-14 Today's Date: 12/26/2011  TIME: 905-939/ 1 Gt 1 Te  PT Assessment/Plan  PT - Assessment/Plan Comments on Treatment Session: cooperative, pleasant patient who fatigues quickly. Pt was MI for all bed mobility and was supervison for standing lateral transfer bed<>chair. Pt was able to amb 20'  RW; min A with a standing conversation with Dr during gait training;however was very fatigued. Pt was able ot WB on LLE during standing exercises without LOB ; holding rail with one UE. Patient did have one episode of uncontrolled sitting at [facility] PT Goals  Acute Rehab PT Goals PT Goal: Ambulate - Progress: Progressing toward goal  PT Treatment Precautions/Restrictions  Precautions Precautions: Fall Required Braces or Orthoses: No Restrictions Weight Bearing Restrictions: No Mobility (including Balance) Bed Mobility Bed Mobility: Yes Supine to Sit: 6: Modified independent (Device/Increase time);HOB elevated (Comment degrees) (45 degrees) Sitting - Scoot to Edge of Bed: 6: Modified independent (Device/Increase time) Transfers Transfers: Yes Sit to Stand: 5: Supervision Sit to Stand Details (indicate cue type and reason): verbal cues for hand placement Stand to Sit: 5: Supervision Stand to Sit Details: verbal cues to reach back for surface Stand Pivot Transfers: 5: Supervision Stand Pivot Transfer Details (indicate cue type and reason): verbal cues for hand placements Ambulation/Gait Ambulation/Gait: Yes Ambulation/Gait Assistance: 4: Min assist Ambulation Distance (Feet): 20 Feet (10' with standing 10') Assistive device: Rolling walker    Exercise  General Exercises - Upper Extremity Shoulder Flexion: Both;5 reps (rest in between sets) General Exercises - Lower Extremity Long Arc Quad: Both;10 reps Toe Raises: Both;15 reps Heel Raises: 15 reps;Both Other Exercises Other  Exercises: sit <>stand x 6 Other Exercises: standing hip flexion x5 bilaterally; use of one UE and rail End of Session PT - End of Session Equipment Utilized During Treatment: Gait belt Activity Tolerance: Patient limited by fatigue Patient left: in chair;with call bell in reach (chair alarm set) Nurse Communication: Mobility status for transfers General Behavior During Session: [facility] for tasks performed Cognition: [facility] for tasks performed  Joby Richart ATKINSO 12/26/2011, 10:21 AM

## 2011-12-26 NOTE — Progress Notes (Signed)
Alexis Ross, Alexis Ross             ACCOUNT NO.:  [id number]  MEDICAL RECORD NO.:  [id number]  LOCATION:  A320                          FACILITY:  [facility]  PHYSICIAN:  Treyshawn Muldrew A. Gerilyn Pilgrim, M.D. DATE OF BIRTH:  07-05-1946  DATE OF PROCEDURE: DATE OF DISCHARGE:                                PROGRESS NOTE   The patient reports that she is feeling well.  She still, however, continues to have some left-sided weakness.  On today's exam, she is awake and alert.  She is lucid and coherent. Speech, language, and cognition are intact.  Cranial evaluation shows extraocular movements are full.  Facial strength is symmetric.  Motor examination continues to show mild left-sided weakness, 4 in both upper and lower extremities.  She is able to walk with a walker.  The patient's imaging is reviewed.  She has a pretty large loss of signal involving the distal basilar artery which is very suspicious for a high-grade stenosis.  There is also some luminal irregularities in several other vessels.  No acute stroke seen on MRI.  MRI is also reviewed and shows some mild white matter hyperintensities.  Carotid duplex Doppler shows significant stenosis on the right with the velocity in the 194, indicating a 50-69% stenosis.  Left velocity is 114.  She has a high cholesterol, total of 360.  She tells me that she has a long history of cholesterol problems for over 20 years.  She has been on some statins in the past and apparently could not tolerate them, particularly Crestor.  The triglyceride 135, LDL quite high at 262, HDL 71.  RPR nonreactive.  Homocystine 9.4.  TSH is very high at 70.9.  B12 level is 650.  ASSESSMENT AND PLAN:  Focal weakness.  Although not seen in the MRI, she still may have had a small stroke.  I am very concerned about the likely significant stenosis involving the basilar artery.  The patient's risk factors seem to be marked dyslipidemia.  Unfortunately, she has had some issues with  tolerating statin.  I still think we need to get her cholesterol down to reduce her risk of having other recurrent events. We are going to ahead and start her on Zocor.  We did discuss diet and exercise.  I think that the patient should be on aspirin-Plavix combination for at least 3 months, preferably closer to 6 months, get her stabilized.  Subsequently, she can be on single antiplatelet agents. I did discuss the elevated TSH that was done by Dr. Juanetta Gosling and deferred to him.     Alexis Ross A. Gerilyn Pilgrim, M.D.     KAD/MEDQ  D:  12/26/2011  T:  12/26/2011  Job:  [id number]

## 2011-12-27 MED ORDER — LEVOTHYROXINE SODIUM 50 MCG PO TABS: 50.0000 ug | ORAL_TABLET | Freq: Every day | ORAL | Status: DC

## 2011-12-27 MED ORDER — CLOPIDOGREL BISULFATE 75 MG PO TABS: 75.0000 mg | ORAL_TABLET | Freq: Every day | ORAL | Status: AC

## 2011-12-27 MED ORDER — ASPIRIN 81 MG PO CHEW: 81.0000 mg | CHEWABLE_TABLET | Freq: Every day | ORAL | Status: AC

## 2011-12-27 NOTE — Progress Notes (Signed)
Physical Therapy Treatment Patient Details Name: Alexis Ross MRN: [id number] DOB: May 07, 1946 Today's Date: 12/27/2011   TIME: 907-937/ 1 GT 1 TE PT Assessment/Plan  PT - Assessment/Plan Comments on Treatment Session: Patient improving in all aspects of therapy today; strength, endurance as well as gait distance. However balance is still decreased with ambulation, communicated to Case Mgmt that pt will require a RW for home. Patient completed 100' of gait training- 20' was performed without an assistive device and patient experienced LOB x3 during dynamic movements of UE and head; educated patient on importance of using RW at  home to prevent falls and conserve energy as patient is still fatiguing easily during therapy. Patient eager to return home. Recommend HHPT PT Goals  Acute Rehab PT Goals PT Goal: Ambulate - Progress: Met  PT Treatment Precautions/Restrictions  Precautions Precautions: Fall Required Braces or Orthoses: No Restrictions Weight Bearing Restrictions: No Mobility (including Balance) Bed Mobility Supine to Sit: 7: Independent Sitting - Scoot to Edge of Bed: 7: Independent Transfers Transfers: Yes Sit to Stand: 6: Modified independent (Device/Increase time) Stand to Sit: 6: Modified independent (Device/Increase time) Ambulation/Gait Ambulation/Gait: Yes Ambulation/Gait Assistance: 5: Supervision;4: Min assist Ambulation/Gait Assistance Details (indicate cue type and reason): supervision with RW;Min A without assistive device;due to decreased balance Ambulation Distance (Feet): 100 Feet (80' with RW;20' without AD) Assistive device: Rolling walker Gait velocity: extremely slow Stairs: No Wheelchair Mobility Wheelchair Mobility: No    Exercise  General Exercises - Upper Extremity Shoulder Flexion: Both;10 reps (able to complete 7 today without rest) General Exercises - Lower Extremity Long Arc Quad: Both;10 reps Toe Raises: Both;10 reps Heel Raises: 10  reps;Both Mini-Sqauts: 10 reps End of Session PT - End of Session Equipment Utilized During Treatment: Gait belt Activity Tolerance: Patient limited by fatigue;Patient tolerated treatment well Patient left: in chair;with call bell in reach (chair alarm set) Nurse Communication:  (communicated need for RW at home) General Behavior During Session: [facility] for tasks performed Cognition: [facility] for tasks performed  Alexis Ross 12/27/2011, 10:01 AM

## 2011-12-27 NOTE — Progress Notes (Signed)
NAMESHATORIA, STOOKSBURY             ACCOUNT NO.:  [id number]  MEDICAL RECORD NO.:  [id number]  LOCATION:  A320                          FACILITY:  [facility]  PHYSICIAN:  Amali Uhls L. Juanetta Gosling, M.D.DATE OF BIRTH:  05/23/1946  DATE OF PROCEDURE: DATE OF DISCHARGE:                                PROGRESS NOTE   Ms. Kehm was admitted with what was thought to perhaps be a TIA.  Her MRI scan is okay so that is felt to be less likely now.  Her nausea and vomiting which had been a substantial part of her admission is much improved.  She has no other new complaints.  Her physical examination shows that her chest is clear.  Her heart is regular.  She does not seem to have any more of the abnormality on her neurological examination.  Her thyroid was very low.  ASSESSMENT:  Then, she has hypothyroidism that will need to be rechecked and treated.  I am going to advance her diet.  If she does well with that, she maybe able to go home later today or in the morning.  She does need to be treated aggressively for risk factor modification for her problems with questionable transient ischemic attack.  I will plan to start her on a thyroid, continue treatments and advance her diet and see.     Analycia Khokhar L. Juanetta Gosling, M.D.     ELH/MEDQ  D:  12/26/2011  T:  12/27/2011  Job:  [id number]

## 2011-12-27 NOTE — Progress Notes (Signed)
   CARE MANAGEMENT NOTE 12/27/2011  Patient:  Alexis Ross, Alexis Ross   Account Number:  [id number]  Date Initiated:  12/25/2011  Documentation initiated by:  Sharrie Rothman  Subjective/Objective Assessment:   Pt admitted with diarrhea and vomiting. Pt lives alone at home.     Action/Plan:   CM spoke with pt. No needs at this time   Anticipated DC Date:  12/29/2011   Anticipated DC Plan:  HOME/SELF CARE      DC Planning Services  CM consult      PAC Choice  DURABLE MEDICAL EQUIPMENT   Choice offered to / List presented to:  [name]   DME arranged  WALKER - ROLLING      DME agency  [facility]        Status of service:  Completed, signed off Medicare Important Message given?  YES (If response is "NO", the following Medicare IM given date fields will be blank) Date Medicare IM given:  12/27/2011 Date Additional Medicare IM given:    Discharge Disposition:  HOME/SELF CARE  Per UR Regulation:    If discussed at Long Length of Stay Meetings, dates discussed:    Comments:  12/27/11 [name], RN BSN Care Management [phone] PT assessed pt and determined that pt would benefit from rolling walker for use in the home due to weakness, possible tia/cva. Facesheet and order faxed to [place] [facility] in [place]. They will deliver before pt is discharged today. Pt nurse made aware. No other needs noted at this time.     12/25/11 [name], RN BSN Care Management Pt admitted with vomiting and diarrhea. Pt states no discharge needs at this time. CM will continue to follow.

## 2011-12-27 NOTE — Progress Notes (Signed)
Subjective: Interval History:  Objective: Vital signs in last 24 hours: Temp:  [97.6 F (36.4 C)-98.4 F (36.9 C)] 98.4 F (36.9 C) (03/13 0619) Pulse Rate:  [67-78] 78  (03/13 0619) Resp:  [20] 20  (03/13 0619) BP: (114-147)/(70-82) 147/82 mmHg (03/13 0619) SpO2:  [93 %-96 %] 94 % (03/13 0619)  Intake/Output from previous day:   Intake/Output this shift: Total I/O In: 260 [P.O.:260] Out: -  Nutritional status: General    Lab Results:  [facility] 12/24/11 1107  WBC 8.1  HGB 11.0*  HCT 35.1*  PLT 251  NA 136  K 3.9  CL 99  CO2 24  GLUCOSE 113*  BUN 22  CREATININE 1.15*  CALCIUM 9.9  LABA1C --   Lipid Panel  Basename 12/25/11 1030  CHOL 360*  TRIG 135  HDL 71  CHOLHDL 5.1  VLDL 27  LDLCALC 295*    Studies/Results: Mr [facility] Wo Contrast  12/25/2011  *RADIOLOGY REPORT*  Clinical Data: Left-sided weakness.  MRA HEAD WITHOUT CONTRAST  Technique: Angiographic images of the Circle of Willis were obtained using MRA technique without intravenous contrast.  Comparison: CT head without contrast 12/24/2011  Findings: The study is mildly degraded by patient motion, decreasing sensitivity for subtle lesions.  Mild irregularity is noted within the cavernous carotid arteries bilaterally.  There is an infundibulum of the left posterior communicating artery.  The right posterior communicating artery is present.  The A1 and M1 segments are within normal limits.  The anterior communicating artery is patent.  Mild irregularity of MCA branch vessels is exaggerated by patient motion.  The left vertebral artery is the dominant vessel.  The PICA origins are just below the field of view bilaterally.  There is significant signal loss in the distal basilar artery, suggesting high-grade stenosis.  P1 segments are present bilaterally.  Prominent posterior communicating arteries are noted as well.  Segmental irregularity and signal loss is present in the posterior cerebral arteries bilaterally.   IMPRESSION:  1.  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 2.  Moderate small vessel disease.  This is likely exaggerated by artifact from patient motion.  Original Report Authenticated By: Jamesetta Orleans. MATTERN, M.D.   Mr Brain Wo Contrast  12/25/2011  *RADIOLOGY REPORT*  Clinical Data: Left-sided weakness over 12 hours.  Nausea vomiting and diarrhea.  Rule out CVA.  MRI HEAD WITHOUT CONTRAST  Technique:  Multiplanar, multiecho pulse sequences of the brain and surrounding structures were obtained according to standard protocol without intravenous contrast.  Comparison: CT head without contrast 12/24/2011  Findings: The diffusion weighted images demonstrate no evidence for acute or subacute infarction.  Mild periventricular subcortical T2 and FLAIR hyperintensities are present bilaterally.  No hemorrhage or mass lesion is evident.  Flow is present in the major intracranial arteries.  The globes and orbits are intact.  The paranasal sinuses and mastoid air cells are clear.  IMPRESSION:  1.  No acute intracranial abnormality. 2.  Periventricular subcortical white matter changes are advanced for age. The finding is nonspecific but can be seen in the setting of chronic microvascular ischemia, a demyelinating process such as multiple sclerosis, vasculitis, complicated migraine headaches, or as the sequelae of a prior infectious or inflammatory process.  Original Report Authenticated By: Jamesetta Orleans. MATTERN, M.D.   US Carotid Duplex Bilateral  12/25/2011  *RADIOLOGY REPORT*  Clinical Data: Stroke symptoms  BILATERAL CAROTID DUPLEX ULTRASOUND  Technique: Gray scale imaging, color Doppler and duplex ultrasound was  performed of bilateral carotid and vertebral arteries in the neck.  Comparison:  None.  Criteria:  Quantification of carotid stenosis is based on velocity parameters that correlate the residual internal carotid diameter with NASCET-based stenosis levels, using the  diameter of the distal internal carotid lumen as the denominator for stenosis measurement.  The following velocity measurements were obtained:                   PEAK SYSTOLIC/END DIASTOLIC RIGHT ICA:                        194/52cm/sec CCA:                        97/18cm/sec SYSTOLIC ICA/CCA RATIO:     2.0 DIASTOLIC ICA/CCA RATIO:    2.96 ECA:                        210cm/sec  LEFT ICA:                        114/39cm/sec CCA:                        126/26cm/sec SYSTOLIC ICA/CCA RATIO:     0.90 DIASTOLIC ICA/CCA RATIO:    1.50 ECA:                        109cm/sec  Findings:  RIGHT CAROTID ARTERY: Mild to moderate scattered heterogeneous right carotid plaque formation.  Right mid ICA velocity elevation measuring 194/52.  Slight spectral broadening and aliasing in this region.  By ultrasound criteria, thisright ICA narrowing is estimated at 50-69%.  RIGHT VERTEBRAL ARTERY:  Antegrade  LEFT CAROTID ARTERY: Mild scattered left carotid system atherosclerosis.  Despite this, there is no hemodynamically significant left ICA stenosis, velocity elevation, or turbulent flow.  LEFT VERTEBRAL ARTERY:  Antegrade  IMPRESSION: Moderate right ICA stenosis estimated at 50-69%.  Left ICA narrowing less than 50%.  Patent antegrade vertebral flow bilaterally.  Original Report Authenticated By: Judie Petit. Ruel Favors, M.D.    Medications:  Scheduled Meds:    . aspirin  81 mg Oral Daily  . clopidogrel  75 mg Oral Q breakfast  . enoxaparin (LOVENOX) injection  40 mg Subcutaneous Q24H  . levothyroxine  50 mcg Oral QAC breakfast  . mulitivitamin with minerals  1 tablet Oral Daily  . pantoprazole sodium  20 mg Per Tube Q1200  . promethazine  25 mg Rectal Once  . simvastatin  20 mg Oral q1800  . sodium chloride       Continuous Infusions:    . sodium chloride 75 mL/hr at 12/25/11 0631   PRN Meds:.acetaminophen, diphenhydrAMINE, loperamide, naproxen, ondansetron, ondansetron, promethazine   Assessment/Plan: See  dictation   LOS: 3 days   Alexis Ross

## 2011-12-27 NOTE — Progress Notes (Signed)
Subjective: There is a she was admitted with symptoms that were thought to be a TIA. She did not show evidence of stroke. 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 She has been having nausea vomiting and diarrhea and I had ordered a gastroenterology consultation but she says this morning she feels fine has no nausea and ate all of her breakfast.  Objective: Vital signs in last 24 hours: Temp:  [97.6 F (36.4 C)-98.4 F (36.9 C)] 98.4 F (36.9 C) (03/13 0619) Pulse Rate:  [67-78] 78  (03/13 0619) Resp:  [20] 20  (03/13 0619) BP: (114-147)/(70-82) 147/82 mmHg (03/13 0619) SpO2:  [93 %-96 %] 94 % (03/13 0619) Weight change:  Last BM Date: 12/26/11  Intake/Output from previous day:    PHYSICAL EXAM General appearance: alert, cooperative and no distress Resp: clear to auscultation bilaterally Cardio: regular rate and rhythm, S1, S2 normal, no murmur, click, rub or gallop GI: soft, non-tender; bowel sounds normal; no masses,  no organomegaly Extremities: extremities normal, atraumatic, no cyanosis or edema  Lab Results:    Basic Metabolic Panel:  Basename 12/24/11 1107  NA 136  K 3.9  CL 99  CO2 24  GLUCOSE 113*  BUN 22  CREATININE 1.15*  CALCIUM 9.9  MG --  PHOS --   Liver Function Tests: No results found for this basename: AST:2,ALT:2,ALKPHOS:2,BILITOT:2,PROT:2,ALBUMIN:2 in the last 72 hours No results found for this basename: LIPASE:2,AMYLASE:2 in the last 72 hours No results found for this basename: AMMONIA:2 in the last 72 hours CBC:  Basename 12/24/11 1107  WBC 8.1  NEUTROABS 7.6  HGB 11.0*  HCT 35.1*  MCV 93.9  PLT 251   Cardiac Enzymes:  Basename 12/24/11 1107  CKTOTAL --  CKMB --  CKMBINDEX --  TROPONINI <0.30   BNP: No results found for this basename: PROBNP:3 in the last 72 hours D-Dimer: No results found for this basename: DDIMER:2 in the last 72 hours CBG: No results found for this basename:  GLUCAP:6 in the last 72 hours Hemoglobin A1C: No results found for this basename: HGBA1C in the last 72 hours Fasting Lipid Panel:  Basename 12/25/11 1030  CHOL 360*  HDL 71  LDLCALC 262*  TRIG 135  CHOLHDL 5.1  LDLDIRECT --   Thyroid Function Tests:  Basename 12/25/11 1030  TSH 70.955*  T4TOTAL --  FREET4 --  T3FREE --  THYROIDAB --   Anemia Panel:  Basename 12/25/11 1030  VITAMINB12 650  FOLATE --  FERRITIN --  TIBC --  IRON --  RETICCTPCT --   Coagulation:  Basename 12/24/11 1133  LABPROT 14.7  INR 1.13   Urine Drug Screen: Drugs of Abuse  No results found for this basename: labopia, cocainscrnur, labbenz, amphetmu, thcu, labbarb    Alcohol Level: No results found for this basename: ETH:2 in the last 72 hours Urinalysis:  Basename 12/24/11 1115  COLORURINE YELLOW  LABSPEC 1.025  PHURINE 5.5  GLUCOSEU NEGATIVE  HGBUR NEGATIVE  BILIRUBINUR NEGATIVE  KETONESUR NEGATIVE  PROTEINUR NEGATIVE  UROBILINOGEN 0.2  NITRITE NEGATIVE  LEUKOCYTESUR NEGATIVE   Misc. Labs:  ABGS No results found for this basename: PHART,PCO2,PO2ART,TCO2,HCO3 in the last 72 hours CULTURES No results found for this or any previous visit (from the past 240 hour(s)). Studies/Results: Mr [facility] Contrast  12/25/2011  *RADIOLOGY REPORT*  Clinical Data: Left-sided weakness.  MRA HEAD WITHOUT CONTRAST  Technique: Angiographic images of the Circle of Willis were obtained using MRA technique  without intravenous contrast.  Comparison: CT head without contrast 12/24/2011  Findings: The study is mildly degraded by patient motion, decreasing sensitivity for subtle lesions.  Mild irregularity is noted within the cavernous carotid arteries bilaterally.  There is an infundibulum of the left posterior communicating artery.  The right posterior communicating artery is present.  The A1 and M1 segments are within normal limits.  The anterior communicating artery is patent.  Mild irregularity of  MCA branch vessels is exaggerated by patient motion.  The left vertebral artery is the dominant vessel.  The PICA origins are just below the field of view bilaterally.  There is significant signal loss in the distal basilar artery, suggesting high-grade stenosis.  P1 segments are present bilaterally.  Prominent posterior communicating arteries are noted as well.  Segmental irregularity and signal loss is present in the posterior cerebral arteries bilaterally.  IMPRESSION:  1.  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 2.  Moderate small vessel disease.  This is likely exaggerated by artifact from patient motion.  Original Report Authenticated By: Jamesetta Orleans. MATTERN, M.D.   Mr Brain Wo Contrast  12/25/2011  *RADIOLOGY REPORT*  Clinical Data: Left-sided weakness over 12 hours.  Nausea vomiting and diarrhea.  Rule out CVA.  MRI HEAD WITHOUT CONTRAST  Technique:  Multiplanar, multiecho pulse sequences of the brain and surrounding structures were obtained according to standard protocol without intravenous contrast.  Comparison: CT head without contrast 12/24/2011  Findings: The diffusion weighted images demonstrate no evidence for acute or subacute infarction.  Mild periventricular subcortical T2 and FLAIR hyperintensities are present bilaterally.  No hemorrhage or mass lesion is evident.  Flow is present in the major intracranial arteries.  The globes and orbits are intact.  The paranasal sinuses and mastoid air cells are clear.  IMPRESSION:  1.  No acute intracranial abnormality. 2.  Periventricular subcortical white matter changes are advanced for age. The finding is nonspecific but can be seen in the setting of chronic microvascular ischemia, a demyelinating process such as multiple sclerosis, vasculitis, complicated migraine headaches, or as the sequelae of a prior infectious or inflammatory process.  Original Report Authenticated By: Jamesetta Orleans. MATTERN, M.D.   US  Carotid Duplex Bilateral  12/25/2011  *RADIOLOGY REPORT*  Clinical Data: Stroke symptoms  BILATERAL CAROTID DUPLEX ULTRASOUND  Technique: Gray scale imaging, color Doppler and duplex ultrasound was performed of bilateral carotid and vertebral arteries in the neck.  Comparison:  None.  Criteria:  Quantification of carotid stenosis is based on velocity parameters that correlate the residual internal carotid diameter with NASCET-based stenosis levels, using the diameter of the distal internal carotid lumen as the denominator for stenosis measurement.  The following velocity measurements were obtained:                   PEAK SYSTOLIC/END DIASTOLIC RIGHT ICA:                        194/52cm/sec CCA:                        97/18cm/sec SYSTOLIC ICA/CCA RATIO:     2.0 DIASTOLIC ICA/CCA RATIO:    2.96 ECA:                        210cm/sec  LEFT ICA:  114/39cm/sec CCA:                        126/26cm/sec SYSTOLIC ICA/CCA RATIO:     0.90 DIASTOLIC ICA/CCA RATIO:    1.50 ECA:                        109cm/sec  Findings:  RIGHT CAROTID ARTERY: Mild to moderate scattered heterogeneous right carotid plaque formation.  Right mid ICA velocity elevation measuring 194/52.  Slight spectral broadening and aliasing in this region.  By ultrasound criteria, thisright ICA narrowing is estimated at 50-69%.  RIGHT VERTEBRAL ARTERY:  Antegrade  LEFT CAROTID ARTERY: Mild scattered left carotid system atherosclerosis.  Despite this, there is no hemodynamically significant left ICA stenosis, velocity elevation, or turbulent flow.  LEFT VERTEBRAL ARTERY:  Antegrade  IMPRESSION: Moderate right ICA stenosis estimated at 50-69%.  Left ICA narrowing less than 50%.  Patent antegrade vertebral flow bilaterally.  Original Report Authenticated By: Judie Petit. Ruel Favors, M.D.    Medications:  Prior to Admission:  Prescriptions prior to admission  Medication Sig Dispense Refill  . acetaminophen (TYLENOL) 500 MG tablet Take 1,000 mg by  mouth every 6 (six) hours as needed. For pain      . diphenhydrAMINE (BENADRYL) 25 MG tablet Take 25 mg by mouth every 6 (six) hours as needed. For allergies      . Multiple Vitamin (MULITIVITAMIN WITH MINERALS) TABS Take 1 tablet by mouth daily.      . naproxen sodium (ANAPROX) 220 MG tablet Take 220 mg by mouth every 8 (eight) hours as needed. Arthritis pain       Scheduled:   . aspirin  81 mg Oral Daily  . clopidogrel  75 mg Oral Q breakfast  . enoxaparin (LOVENOX) injection  40 mg Subcutaneous Q24H  . levothyroxine  50 mcg Oral QAC breakfast  . mulitivitamin with minerals  1 tablet Oral Daily  . pantoprazole sodium  20 mg Per Tube Q1200  . promethazine  25 mg Rectal Once  . simvastatin  20 mg Oral q1800  . sodium chloride       Continuous:   . sodium chloride 75 mL/hr at 12/25/11 0631   ZOX:[id number], diphenhydrAMINE, loperamide, naproxen, ondansetron, ondansetron, promethazine  Assesment: she was admitted with TIA-like symptoms. She had nausea vomiting and diarrhea which seems to have resolved. She has hypothyroidism which is new diagnosis Active Problems:  * No active hospital problems. *     Plan: If he's able to take her medications and food down I will plan to discharge her later today    LOS: 3 days   Reannah Totten L 12/27/2011, 8:47 AM

## 2011-12-27 NOTE — Progress Notes (Signed)
Patient left with family via person vehicle. Escorted out by staff via wheelchair.

## 2011-12-28 NOTE — Progress Notes (Signed)
Alexis Ross, DOYLE             ACCOUNT NO.:  [id number]  MEDICAL RECORD NO.:  [id number]  LOCATION:  A320                          FACILITY:  [facility]  PHYSICIAN:  Yvanna Vidas A. Gerilyn Pilgrim, M.D. DATE OF BIRTH:  [birth year]  DATE OF PROCEDURE: DATE OF DISCHARGE:  12/27/2011                                PROGRESS NOTE   SUBJECTIVE:  She reports that she is doing a whole lot better.  She thinks her left-sided weakness has improved.  OBJECTIVE:  Today, she is awake and alert.  She is lucid and coherent. Speech, language, and cognition are intact.  She has full extraocular movements.  Facial muscle strength is symmetric.  She does indeed have improved strength in the left upper extremity.  It is essentially full strength, 5.  She still has some left leg weakness graded at 4+.  She is ambulating with assistance from physical therapy.  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  We need to aggressively modify risk factors including reduce in lipids and blood pressure control.  She is to continue aspirin and Plavix combination for 6 months and would have a follow up in the office in a month.     Damione Robideau A. Gerilyn Pilgrim, M.D.     KAD/MEDQ  D:  12/27/2011  T:  12/28/2011  Job:  [id number]

## 2011-12-29 NOTE — Discharge Summary (Signed)
Physician Discharge Summary  Patient ID: Alexis Ross MRN: [id number] DOB/AGE: 68/24/1947 68 y.o. Primary Care Physician:No primary provider on file. Admit date: 12/24/2011 Discharge date: 12/29/2011    Discharge Diagnoses:  TIA Acute gastroenteritis Hypothyroidism Active Problems:  * No active hospital problems. *    Medication List  As of 12/29/2011  7:37 AM   TAKE these medications         acetaminophen 500 MG tablet   Commonly known as: TYLENOL   Take 1,000 mg by mouth every 6 (six) hours as needed. For pain      aspirin 81 MG chewable tablet   Chew 1 tablet (81 mg total) by mouth daily.      clopidogrel 75 MG tablet   Commonly known as: PLAVIX   Take 1 tablet (75 mg total) by mouth daily with breakfast.      diphenhydrAMINE 25 MG tablet   Commonly known as: BENADRYL   Take 25 mg by mouth every 6 (six) hours as needed. For allergies      levothyroxine 50 MCG tablet   Commonly known as: SYNTHROID, LEVOTHROID   Take 1 tablet (50 mcg total) by mouth daily before breakfast.      mulitivitamin with minerals Tabs   Take 1 tablet by mouth daily.      naproxen sodium 220 MG tablet   Commonly known as: ANAPROX   Take 220 mg by mouth every 8 (eight) hours as needed. Arthritis pain            Discharged Condition: Improved    Consults: Neurology  Significant Diagnostic Studies: Ct Head Wo Contrast  12/24/2011  *RADIOLOGY REPORT*  Clinical Data: Numbness and weakness.  CT HEAD WITHOUT CONTRAST  Technique:  Contiguous axial images were obtained from the base of the skull through the vertex without contrast.  Comparison: None.  Findings: The ventricles are in the midline without mass effect or shift.  They are normal in size and configuration for age.  No extra-axial fluid collections are identified.  Mild patchy periventricular white matter disease is likely microvascular ischemic change.  No definite CT findings for acute hemispheric infarction or intracranial  hemorrhage.  The brainstem and cerebellum grossly normal.  No mass lesions.  The bony structures are intact.  The paranasal sinuses and mastoid air cells are clear.  The globes are intact.  IMPRESSION: No acute intracranial findings or mass lesions.  Original Report Authenticated By: P. Loralie Champagne, M.D.   Mr Maxine Glenn Head Wo Contrast  12/25/2011  *RADIOLOGY REPORT*  Clinical Data: Left-sided weakness.  MRA HEAD WITHOUT CONTRAST  Technique: Angiographic images of the Circle of Willis were obtained using MRA technique without intravenous contrast.  Comparison: CT head without contrast 12/24/2011  Findings: The study is mildly degraded by patient motion, decreasing sensitivity for subtle lesions.  Mild irregularity is noted within the cavernous carotid arteries bilaterally.  There is an infundibulum of the left posterior communicating artery.  The right posterior communicating artery is present.  The A1 and M1 segments are within normal limits.  The anterior communicating artery is patent.  Mild irregularity of MCA branch vessels is exaggerated by patient motion.  The left vertebral artery is the dominant vessel.  The PICA origins are just below the field of view bilaterally.  There is significant signal loss in the distal basilar artery, suggesting high-grade stenosis.  P1 segments are present bilaterally.  Prominent posterior communicating arteries are noted as well.  Segmental irregularity and signal loss is  present in the posterior cerebral arteries bilaterally.  IMPRESSION:  1.  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 2.  Moderate small vessel disease.  This is likely exaggerated by artifact from patient motion.  Original Report Authenticated By: Jamesetta Orleans. MATTERN, M.D.   Mr Brain Wo Contrast  12/25/2011  *RADIOLOGY REPORT*  Clinical Data: Left-sided weakness over 12 hours.  Nausea vomiting and diarrhea.  Rule out CVA.  MRI HEAD WITHOUT CONTRAST  Technique:   Multiplanar, multiecho pulse sequences of the brain and surrounding structures were obtained according to standard protocol without intravenous contrast.  Comparison: CT head without contrast 12/24/2011  Findings: The diffusion weighted images demonstrate no evidence for acute or subacute infarction.  Mild periventricular subcortical T2 and FLAIR hyperintensities are present bilaterally.  No hemorrhage or mass lesion is evident.  Flow is present in the major intracranial arteries.  The globes and orbits are intact.  The paranasal sinuses and mastoid air cells are clear.  IMPRESSION:  1.  No acute intracranial abnormality. 2.  Periventricular subcortical white matter changes are advanced for age. The finding is nonspecific but can be seen in the setting of chronic microvascular ischemia, a demyelinating process such as multiple sclerosis, vasculitis, complicated migraine headaches, or as the sequelae of a prior infectious or inflammatory process.  Original Report Authenticated By: Jamesetta Orleans. MATTERN, M.D.   US Carotid Duplex Bilateral  12/25/2011  *RADIOLOGY REPORT*  Clinical Data: Stroke symptoms  BILATERAL CAROTID DUPLEX ULTRASOUND  Technique: Gray scale imaging, color Doppler and duplex ultrasound was performed of bilateral carotid and vertebral arteries in the neck.  Comparison:  None.  Criteria:  Quantification of carotid stenosis is based on velocity parameters that correlate the residual internal carotid diameter with NASCET-based stenosis levels, using the diameter of the distal internal carotid lumen as the denominator for stenosis measurement.  The following velocity measurements were obtained:                   PEAK SYSTOLIC/END DIASTOLIC RIGHT ICA:                        194/52cm/sec CCA:                        97/18cm/sec SYSTOLIC ICA/CCA RATIO:     2.0 DIASTOLIC ICA/CCA RATIO:    2.96 ECA:                        210cm/sec  LEFT ICA:                        114/39cm/sec CCA:                         126/26cm/sec SYSTOLIC ICA/CCA RATIO:     0.90 DIASTOLIC ICA/CCA RATIO:    1.50 ECA:                        109cm/sec  Findings:  RIGHT CAROTID ARTERY: Mild to moderate scattered heterogeneous right carotid plaque formation.  Right mid ICA velocity elevation measuring 194/52.  Slight spectral broadening and aliasing in this region.  By ultrasound criteria, thisright ICA narrowing is estimated at 50-69%.  RIGHT VERTEBRAL ARTERY:  Antegrade  LEFT CAROTID ARTERY: Mild scattered left carotid system atherosclerosis.  Despite this, there is no hemodynamically  significant left ICA stenosis, velocity elevation, or turbulent flow.  LEFT VERTEBRAL ARTERY:  Antegrade  IMPRESSION: Moderate right ICA stenosis estimated at 50-69%.  Left ICA narrowing less than 50%.  Patent antegrade vertebral flow bilaterally.  Original Report Authenticated By: Judie Petit. Ruel Favors, M.D.    Lab Results: Basic Metabolic Panel: No results found for this basename: NA:2,K:2,CL:2,CO2:2,GLUCOSE:2,BUN:2,CREATININE:2,CALCIUM:2,MG:2,PHOS:2 in the last 72 hours Liver Function Tests: No results found for this basename: AST:2,ALT:2,ALKPHOS:2,BILITOT:2,PROT:2,ALBUMIN:2 in the last 72 hours   CBC: No results found for this basename: WBC:2,NEUTROABS:2,HGB:2,HCT:2,MCV:2,PLT:2 in the last 72 hours  No results found for this or any previous visit (from the past 240 hour(s)).   Hospital Course: She was admitted after developing some numbness on the left side. She was brought to the emergency room. She had also been having nausea vomiting and diarrhea. In the emergency room she had a CT did not show any definite stroke but she did have some changes on her examination suggests that perhaps she had had a TIA. She was brought into the hospital was started on IV fluids nausea medications et Karie Soda. She had MRI of the brain which showed that she had extensive vascular disease in the brain but she did not have a definite stroke. She was treated for the nausea  vomiting and diarrhea and that resolved after about 48 hours. She was found to be hypothyroid and that was treated  Discharge Exam: Blood pressure 147/82, pulse 78, temperature 98.4 F (36.9 C), temperature source Oral, resp. rate 20, height 5\' 6"  (1.676 m), weight 83.915 kg (185 lb), SpO2 94.00%. She was awake and alert she was not nauseated her neurological exam was normal her chest was clear and her heart was regular  Disposition: Home  Discharge Orders    Future Orders Please Complete By Expires   Discharge patient         Follow-up Information    Follow up with Satoya Feeley L, MD. Schedule an appointment as soon as possible for a visit in 2 weeks.   Contact information:   [address] [place] [address] [phone]       Follow up with DOONQUAH,KOFI, MD. Schedule an appointment as soon as possible for a visit in 1 month.   Contact information:   [address] [place] [address] [phone]          Signed: Fredirick Maudlin Pager [phone]  12/29/2011, 7:37 AM

## 2012-01-08 DIAGNOSIS — E039 Hypothyroidism, unspecified: Secondary | ICD-10-CM | POA: Diagnosis not present

## 2012-01-08 DIAGNOSIS — A088 Other specified intestinal infections: Secondary | ICD-10-CM | POA: Diagnosis not present

## 2012-01-08 DIAGNOSIS — G459 Transient cerebral ischemic attack, unspecified: Secondary | ICD-10-CM | POA: Diagnosis not present

## 2012-01-11 DIAGNOSIS — R413 Other amnesia: Secondary | ICD-10-CM | POA: Diagnosis not present

## 2012-01-11 DIAGNOSIS — I6350 Cerebral infarction due to unspecified occlusion or stenosis of unspecified cerebral artery: Secondary | ICD-10-CM | POA: Diagnosis not present

## 2012-01-11 DIAGNOSIS — E785 Hyperlipidemia, unspecified: Secondary | ICD-10-CM | POA: Diagnosis not present

## 2012-01-11 DIAGNOSIS — I63239 Cerebral infarction due to unspecified occlusion or stenosis of unspecified carotid arteries: Secondary | ICD-10-CM | POA: Diagnosis not present

## 2012-02-15 DIAGNOSIS — G459 Transient cerebral ischemic attack, unspecified: Secondary | ICD-10-CM | POA: Diagnosis not present

## 2012-02-15 DIAGNOSIS — A088 Other specified intestinal infections: Secondary | ICD-10-CM | POA: Diagnosis not present

## 2012-02-15 DIAGNOSIS — E039 Hypothyroidism, unspecified: Secondary | ICD-10-CM | POA: Diagnosis not present

## 2012-02-19 DIAGNOSIS — E039 Hypothyroidism, unspecified: Secondary | ICD-10-CM | POA: Diagnosis not present

## 2012-02-19 DIAGNOSIS — G459 Transient cerebral ischemic attack, unspecified: Secondary | ICD-10-CM | POA: Diagnosis not present

## 2012-02-19 DIAGNOSIS — E785 Hyperlipidemia, unspecified: Secondary | ICD-10-CM | POA: Diagnosis not present

## 2012-04-10 DIAGNOSIS — I6990 Unspecified sequelae of unspecified cerebrovascular disease: Secondary | ICD-10-CM | POA: Diagnosis not present

## 2012-04-10 DIAGNOSIS — E785 Hyperlipidemia, unspecified: Secondary | ICD-10-CM | POA: Diagnosis not present

## 2012-06-18 DIAGNOSIS — E039 Hypothyroidism, unspecified: Secondary | ICD-10-CM | POA: Diagnosis not present

## 2012-06-20 DIAGNOSIS — I6789 Other cerebrovascular disease: Secondary | ICD-10-CM | POA: Diagnosis not present

## 2012-06-20 DIAGNOSIS — E039 Hypothyroidism, unspecified: Secondary | ICD-10-CM | POA: Diagnosis not present

## 2012-06-20 DIAGNOSIS — I10 Essential (primary) hypertension: Secondary | ICD-10-CM | POA: Diagnosis not present

## 2012-06-20 DIAGNOSIS — E785 Hyperlipidemia, unspecified: Secondary | ICD-10-CM | POA: Diagnosis not present

## 2012-07-08 DIAGNOSIS — R569 Unspecified convulsions: Secondary | ICD-10-CM | POA: Diagnosis not present

## 2012-07-08 DIAGNOSIS — G459 Transient cerebral ischemic attack, unspecified: Secondary | ICD-10-CM | POA: Diagnosis not present

## 2012-07-08 DIAGNOSIS — E785 Hyperlipidemia, unspecified: Secondary | ICD-10-CM | POA: Diagnosis not present

## 2012-07-08 DIAGNOSIS — I6990 Unspecified sequelae of unspecified cerebrovascular disease: Secondary | ICD-10-CM | POA: Diagnosis not present

## 2012-07-18 DIAGNOSIS — R569 Unspecified convulsions: Secondary | ICD-10-CM | POA: Diagnosis not present

## 2012-10-14 DIAGNOSIS — G459 Transient cerebral ischemic attack, unspecified: Secondary | ICD-10-CM | POA: Diagnosis not present

## 2012-10-14 DIAGNOSIS — I6990 Unspecified sequelae of unspecified cerebrovascular disease: Secondary | ICD-10-CM | POA: Diagnosis not present

## 2012-10-14 DIAGNOSIS — E785 Hyperlipidemia, unspecified: Secondary | ICD-10-CM | POA: Diagnosis not present

## 2012-10-14 DIAGNOSIS — R569 Unspecified convulsions: Secondary | ICD-10-CM | POA: Diagnosis not present

## 2012-10-17 DIAGNOSIS — E785 Hyperlipidemia, unspecified: Secondary | ICD-10-CM | POA: Diagnosis not present

## 2012-10-17 DIAGNOSIS — I6789 Other cerebrovascular disease: Secondary | ICD-10-CM | POA: Diagnosis not present

## 2012-10-17 DIAGNOSIS — I10 Essential (primary) hypertension: Secondary | ICD-10-CM | POA: Diagnosis not present

## 2012-10-17 DIAGNOSIS — E039 Hypothyroidism, unspecified: Secondary | ICD-10-CM | POA: Diagnosis not present

## 2012-10-22 DIAGNOSIS — E039 Hypothyroidism, unspecified: Secondary | ICD-10-CM | POA: Diagnosis not present

## 2012-10-22 DIAGNOSIS — E785 Hyperlipidemia, unspecified: Secondary | ICD-10-CM | POA: Diagnosis not present

## 2012-10-22 DIAGNOSIS — I6789 Other cerebrovascular disease: Secondary | ICD-10-CM | POA: Diagnosis not present

## 2012-10-22 DIAGNOSIS — R569 Unspecified convulsions: Secondary | ICD-10-CM | POA: Diagnosis not present

## 2012-10-22 DIAGNOSIS — Z23 Encounter for immunization: Secondary | ICD-10-CM | POA: Diagnosis not present

## 2013-01-09 DIAGNOSIS — I6990 Unspecified sequelae of unspecified cerebrovascular disease: Secondary | ICD-10-CM | POA: Diagnosis not present

## 2013-01-09 DIAGNOSIS — R42 Dizziness and giddiness: Secondary | ICD-10-CM | POA: Diagnosis not present

## 2013-01-21 DIAGNOSIS — E039 Hypothyroidism, unspecified: Secondary | ICD-10-CM | POA: Diagnosis not present

## 2013-01-21 DIAGNOSIS — I10 Essential (primary) hypertension: Secondary | ICD-10-CM | POA: Diagnosis not present

## 2013-01-21 DIAGNOSIS — E785 Hyperlipidemia, unspecified: Secondary | ICD-10-CM | POA: Diagnosis not present

## 2013-04-16 DIAGNOSIS — E785 Hyperlipidemia, unspecified: Secondary | ICD-10-CM | POA: Diagnosis not present

## 2013-04-16 DIAGNOSIS — I10 Essential (primary) hypertension: Secondary | ICD-10-CM | POA: Diagnosis not present

## 2013-04-16 DIAGNOSIS — E039 Hypothyroidism, unspecified: Secondary | ICD-10-CM | POA: Diagnosis not present

## 2013-04-23 DIAGNOSIS — E785 Hyperlipidemia, unspecified: Secondary | ICD-10-CM | POA: Diagnosis not present

## 2013-04-23 DIAGNOSIS — G459 Transient cerebral ischemic attack, unspecified: Secondary | ICD-10-CM | POA: Diagnosis not present

## 2013-04-23 DIAGNOSIS — E039 Hypothyroidism, unspecified: Secondary | ICD-10-CM | POA: Diagnosis not present

## 2013-04-23 DIAGNOSIS — R002 Palpitations: Secondary | ICD-10-CM | POA: Diagnosis not present

## 2013-07-08 DIAGNOSIS — I10 Essential (primary) hypertension: Secondary | ICD-10-CM | POA: Diagnosis not present

## 2013-07-08 DIAGNOSIS — I6990 Unspecified sequelae of unspecified cerebrovascular disease: Secondary | ICD-10-CM | POA: Diagnosis not present

## 2013-08-04 DIAGNOSIS — E039 Hypothyroidism, unspecified: Secondary | ICD-10-CM | POA: Diagnosis not present

## 2013-08-04 DIAGNOSIS — G459 Transient cerebral ischemic attack, unspecified: Secondary | ICD-10-CM | POA: Diagnosis not present

## 2013-08-04 DIAGNOSIS — Z23 Encounter for immunization: Secondary | ICD-10-CM | POA: Diagnosis not present

## 2013-08-04 DIAGNOSIS — E785 Hyperlipidemia, unspecified: Secondary | ICD-10-CM | POA: Diagnosis not present

## 2013-12-05 DIAGNOSIS — I6789 Other cerebrovascular disease: Secondary | ICD-10-CM | POA: Diagnosis not present

## 2013-12-05 DIAGNOSIS — E039 Hypothyroidism, unspecified: Secondary | ICD-10-CM | POA: Diagnosis not present

## 2013-12-05 DIAGNOSIS — E785 Hyperlipidemia, unspecified: Secondary | ICD-10-CM | POA: Diagnosis not present

## 2013-12-05 DIAGNOSIS — G459 Transient cerebral ischemic attack, unspecified: Secondary | ICD-10-CM | POA: Diagnosis not present

## 2013-12-19 ENCOUNTER — Encounter (HOSPITAL_COMMUNITY): Payer: Self-pay | Admitting: Emergency Medicine

## 2013-12-19 ENCOUNTER — Emergency Department (HOSPITAL_COMMUNITY): Payer: Medicare Other

## 2013-12-19 ENCOUNTER — Inpatient Hospital Stay (HOSPITAL_COMMUNITY)
Admission: EM | Admit: 2013-12-19 | Discharge: 2013-12-21 | DRG: 089 | Disposition: A | Discharge status: Home / Self Care | Payer: Medicare Other | Attending: General Surgery | Admitting: General Surgery

## 2013-12-19 DIAGNOSIS — S0292XA Unspecified fracture of facial bones, initial encounter for closed fracture: Secondary | ICD-10-CM

## 2013-12-19 DIAGNOSIS — S060X1A Concussion with loss of consciousness of 30 minutes or less, initial encounter: Principal | ICD-10-CM | POA: Diagnosis present

## 2013-12-19 DIAGNOSIS — S060XAA Concussion with loss of consciousness status unknown, initial encounter: Secondary | ICD-10-CM

## 2013-12-19 DIAGNOSIS — S0230XA Fracture of orbital floor, unspecified side, initial encounter for closed fracture: Secondary | ICD-10-CM | POA: Diagnosis present

## 2013-12-19 DIAGNOSIS — E039 Hypothyroidism, unspecified: Secondary | ICD-10-CM | POA: Diagnosis present

## 2013-12-19 DIAGNOSIS — S02400A Malar fracture unspecified, initial encounter for closed fracture: Secondary | ICD-10-CM | POA: Diagnosis not present

## 2013-12-19 DIAGNOSIS — S060X9A Concussion with loss of consciousness of unspecified duration, initial encounter: Secondary | ICD-10-CM

## 2013-12-19 DIAGNOSIS — S8990XA Unspecified injury of unspecified lower leg, initial encounter: Secondary | ICD-10-CM | POA: Diagnosis not present

## 2013-12-19 DIAGNOSIS — S0285XA Fracture of orbit, unspecified, initial encounter for closed fracture: Secondary | ICD-10-CM

## 2013-12-19 DIAGNOSIS — S0990XA Unspecified injury of head, initial encounter: Secondary | ICD-10-CM | POA: Diagnosis not present

## 2013-12-19 DIAGNOSIS — S298XXA Other specified injuries of thorax, initial encounter: Secondary | ICD-10-CM | POA: Diagnosis not present

## 2013-12-19 DIAGNOSIS — Z79899 Other long term (current) drug therapy: Secondary | ICD-10-CM | POA: Diagnosis not present

## 2013-12-19 DIAGNOSIS — Z7982 Long term (current) use of aspirin: Secondary | ICD-10-CM

## 2013-12-19 DIAGNOSIS — D62 Acute posthemorrhagic anemia: Secondary | ICD-10-CM | POA: Diagnosis not present

## 2013-12-19 DIAGNOSIS — Y9229 Other specified public building as the place of occurrence of the external cause: Secondary | ICD-10-CM | POA: Diagnosis not present

## 2013-12-19 DIAGNOSIS — Z87891 Personal history of nicotine dependence: Secondary | ICD-10-CM | POA: Diagnosis not present

## 2013-12-19 DIAGNOSIS — I69998 Other sequelae following unspecified cerebrovascular disease: Secondary | ICD-10-CM

## 2013-12-19 DIAGNOSIS — W19XXXA Unspecified fall, initial encounter: Secondary | ICD-10-CM

## 2013-12-19 DIAGNOSIS — S0280XA Fracture of other specified skull and facial bones, unspecified side, initial encounter for closed fracture: Secondary | ICD-10-CM

## 2013-12-19 DIAGNOSIS — R51 Headache: Secondary | ICD-10-CM | POA: Diagnosis present

## 2013-12-19 DIAGNOSIS — E785 Hyperlipidemia, unspecified: Secondary | ICD-10-CM | POA: Diagnosis present

## 2013-12-19 DIAGNOSIS — S8000XA Contusion of unspecified knee, initial encounter: Secondary | ICD-10-CM | POA: Diagnosis present

## 2013-12-19 DIAGNOSIS — W010XXA Fall on same level from slipping, tripping and stumbling without subsequent striking against object, initial encounter: Secondary | ICD-10-CM | POA: Diagnosis present

## 2013-12-19 DIAGNOSIS — S02401A Maxillary fracture, unspecified, initial encounter for closed fracture: Secondary | ICD-10-CM

## 2013-12-19 DIAGNOSIS — S99919A Unspecified injury of unspecified ankle, initial encounter: Secondary | ICD-10-CM | POA: Diagnosis not present

## 2013-12-19 DIAGNOSIS — S022XXA Fracture of nasal bones, initial encounter for closed fracture: Secondary | ICD-10-CM

## 2013-12-19 DIAGNOSIS — IMO0002 Reserved for concepts with insufficient information to code with codable children: Secondary | ICD-10-CM

## 2013-12-19 DIAGNOSIS — S199XXA Unspecified injury of neck, initial encounter: Secondary | ICD-10-CM | POA: Diagnosis not present

## 2013-12-19 DIAGNOSIS — T1490XA Injury, unspecified, initial encounter: Secondary | ICD-10-CM | POA: Diagnosis not present

## 2013-12-19 DIAGNOSIS — S0993XA Unspecified injury of face, initial encounter: Secondary | ICD-10-CM | POA: Diagnosis not present

## 2013-12-19 DIAGNOSIS — M25569 Pain in unspecified knee: Secondary | ICD-10-CM | POA: Diagnosis not present

## 2013-12-19 HISTORY — DX: Concussion with loss of consciousness of unspecified duration, initial encounter: S06.0X9A

## 2013-12-19 HISTORY — DX: Unspecified fall, initial encounter: W19.XXXA

## 2013-12-19 HISTORY — DX: Hypothyroidism, unspecified: E03.9

## 2013-12-19 HISTORY — DX: Concussion with loss of consciousness status unknown, initial encounter: S06.0XAA

## 2013-12-19 HISTORY — DX: Unspecified fracture of facial bones, initial encounter for closed fracture: S02.92XA

## 2013-12-19 HISTORY — DX: Cerebral infarction, unspecified: I63.9

## 2013-12-19 HISTORY — DX: Concussion with loss of consciousness of 30 minutes or less, initial encounter: S06.0X1A

## 2013-12-19 HISTORY — DX: Anemia, unspecified: D64.9

## 2013-12-19 LAB — PROTIME-INR
INR: 0.94 (ref 0.00–1.49)
Prothrombin Time: 12.4 seconds (ref 11.6–15.2)

## 2013-12-19 LAB — COMPREHENSIVE METABOLIC PANEL
ALT: 12 U/L (ref 0–35)
AST: 20 U/L (ref 0–37)
Albumin: 4.2 g/dL (ref 3.5–5.2)
Alkaline Phosphatase: 109 U/L (ref 39–117)
BUN: 15 mg/dL (ref 6–23)
CO2: 27 mEq/L (ref 19–32)
Calcium: 10.1 mg/dL (ref 8.4–10.5)
Chloride: 105 mEq/L (ref 96–112)
Creatinine, Ser: 0.94 mg/dL (ref 0.50–1.10)
GFR calc Af Amer: 70 mL/min — ABNORMAL LOW (ref >90)
GFR calc non Af Amer: 61 mL/min — ABNORMAL LOW (ref >90)
Glucose, Bld: 122 mg/dL — ABNORMAL HIGH (ref 70–99)
Potassium: 4 mEq/L (ref 3.7–5.3)
Sodium: 144 mEq/L (ref 137–147)
Total Bilirubin: 0.1 mg/dL — ABNORMAL LOW (ref 0.3–1.2)
Total Protein: 7.9 g/dL (ref 6.0–8.3)

## 2013-12-19 LAB — URINALYSIS, ROUTINE W REFLEX MICROSCOPIC
Bilirubin Urine: NEGATIVE
Glucose, UA: NEGATIVE mg/dL
Hgb urine dipstick: NEGATIVE
Ketones, ur: NEGATIVE mg/dL
Leukocytes, UA: NEGATIVE
Nitrite: NEGATIVE
Protein, ur: NEGATIVE mg/dL
Specific Gravity, Urine: 1.01 (ref 1.005–1.030)
Urobilinogen, UA: 0.2 mg/dL (ref 0.0–1.0)
pH: 7 (ref 5.0–8.0)

## 2013-12-19 LAB — CBC WITH DIFFERENTIAL/PLATELET
BASOS ABS: 0 10*3/uL (ref 0.0–0.1)
BASOS PCT: 0 % (ref 0–1)
EOS ABS: 0.1 10*3/uL (ref 0.0–0.7)
Eosinophils Relative: 1 % (ref 0–5)
HCT: 39.1 % (ref 36.0–46.0)
HEMOGLOBIN: 12.7 g/dL (ref 12.0–15.0)
Lymphocytes Relative: 20 % (ref 12–46)
Lymphs Abs: 2.1 10*3/uL (ref 0.7–4.0)
MCH: 30 pg (ref 26.0–34.0)
MCHC: 32.5 g/dL (ref 30.0–36.0)
MCV: 92.2 fL (ref 78.0–100.0)
MONOS PCT: 7 % (ref 3–12)
Monocytes Absolute: 0.7 10*3/uL (ref 0.1–1.0)
NEUTROS ABS: 7.3 10*3/uL (ref 1.7–7.7)
NEUTROS PCT: 72 % (ref 43–77)
PLATELETS: 272 10*3/uL (ref 150–400)
RBC: 4.24 MIL/uL (ref 3.87–5.11)
RDW: 14.9 % (ref 11.5–15.5)
WBC: 10.2 10*3/uL (ref 4.0–10.5)

## 2013-12-19 LAB — SAMPLE TO BLOOD BANK

## 2013-12-19 LAB — APTT: aPTT: 27 seconds (ref 24–37)

## 2013-12-19 MED ORDER — ACETAMINOPHEN 325 MG PO TABS: 650.0000 mg | ORAL_TABLET | ORAL | Status: DC | PRN

## 2013-12-19 MED ORDER — LEVOTHYROXINE SODIUM 50 MCG PO TABS
50.0000 ug | ORAL_TABLET | Freq: Every day | ORAL | Status: DC
  Administered 2013-12-20 – 2013-12-21 (×2): 50 ug via ORAL
  Filled 2013-12-19 (×3): qty 1

## 2013-12-19 MED ORDER — SODIUM CHLORIDE 0.9 % IV BOLUS (SEPSIS)
1000.0000 mL | Freq: Once | INTRAVENOUS | Status: AC
  Administered 2013-12-19: 1000 mL via INTRAVENOUS

## 2013-12-19 MED ORDER — PANTOPRAZOLE SODIUM 40 MG IV SOLR
40.0000 mg | Freq: Every day | INTRAVENOUS | Status: DC
  Filled 2013-12-19: qty 40

## 2013-12-19 MED ORDER — OXYCODONE-ACETAMINOPHEN 5-325 MG PO TABS
2.0000 | ORAL_TABLET | Freq: Once | ORAL | Status: AC
  Administered 2013-12-19: 2 via ORAL
  Filled 2013-12-19: qty 2

## 2013-12-19 MED ORDER — DOCUSATE SODIUM 100 MG PO CAPS
100.0000 mg | ORAL_CAPSULE | Freq: Two times a day (BID) | ORAL | Status: DC
  Administered 2013-12-20 – 2013-12-21 (×3): 100 mg via ORAL
  Filled 2013-12-19: qty 1

## 2013-12-19 MED ORDER — ONDANSETRON HCL 4 MG/2ML IJ SOLN: 4.0000 mg | Freq: Four times a day (QID) | INTRAMUSCULAR | Status: DC | PRN

## 2013-12-19 MED ORDER — OXYCODONE-ACETAMINOPHEN 5-325 MG PO TABS: 1.0000 | ORAL_TABLET | ORAL | Status: DC | PRN

## 2013-12-19 MED ORDER — DOCUSATE SODIUM 100 MG PO CAPS: 100.0000 mg | ORAL_CAPSULE | Freq: Two times a day (BID) | ORAL | Status: DC

## 2013-12-19 MED ORDER — POTASSIUM CHLORIDE IN NACL 20-0.9 MEQ/L-% IV SOLN
INTRAVENOUS | Status: DC
  Administered 2013-12-20: 01:00:00 via INTRAVENOUS
  Filled 2013-12-19 (×2): qty 1000

## 2013-12-19 MED ORDER — ENOXAPARIN SODIUM 40 MG/0.4ML ~~LOC~~ SOLN
40.0000 mg | SUBCUTANEOUS | Status: DC
  Administered 2013-12-20 – 2013-12-21 (×2): 40 mg via SUBCUTANEOUS
  Filled 2013-12-19 (×2): qty 0.4

## 2013-12-19 MED ORDER — HYDROCODONE-ACETAMINOPHEN 5-325 MG PO TABS
2.0000 | ORAL_TABLET | ORAL | Status: DC | PRN
  Administered 2013-12-19 – 2013-12-21 (×4): 2 via ORAL
  Filled 2013-12-19 (×4): qty 2

## 2013-12-19 MED ORDER — OXYCODONE HCL 5 MG PO TABS: 5.0000 mg | ORAL_TABLET | ORAL | Status: DC | PRN

## 2013-12-19 MED ORDER — MORPHINE SULFATE 2 MG/ML IJ SOLN: 1.0000 mg | INTRAMUSCULAR | Status: DC | PRN

## 2013-12-19 MED ORDER — PANTOPRAZOLE SODIUM 40 MG PO TBEC
40.0000 mg | DELAYED_RELEASE_TABLET | Freq: Every day | ORAL | Status: DC
  Administered 2013-12-20 – 2013-12-21 (×2): 40 mg via ORAL
  Filled 2013-12-19: qty 1

## 2013-12-19 MED ORDER — ONDANSETRON HCL 4 MG PO TABS
4.0000 mg | ORAL_TABLET | Freq: Four times a day (QID) | ORAL | Status: DC | PRN
Start: 2013-12-19 — End: 2013-12-21

## 2013-12-19 NOTE — H&P (Signed)
History   Alexis Ross is an 68 y.o. female.   Chief Complaint:  Chief Complaint  Patient presents with  . Fall    Fall This is a new problem. The current episode started today. The problem occurs rarely. The problem has been rapidly improving. Associated symptoms include headaches and joint swelling. Pertinent negatives include no abdominal pain, chest pain, myalgias, nausea, neck pain, numbness, rash, vomiting or weakness.   Patient is a 68 year old female with a history of stroke with mild residual left-sided weakness, hyperlipidemia, hypothyroidism who presented to the [facility] emergency department after she had a mechanical fall today. Patient reports she was at [employer] with her daughter when she turned around and tripped over something on the floor. She landed on her left side and struck her face. She feels she did lose consciousness for several seconds. She is having left-sided facial pain and left knee pain. She is on Plavix. No new numbness, tingling or weakness. No chest pain shortness of breath. No abdominal pain. She is complaining of left facial pain and left knee pain. Pt family requested transfer to [facility] for evaluation and did not feel comfortable taking the patient home given she was asking repetitive questions at times.   Past Medical History  Diagnosis Date  . Hyperlipidemia   . Arthritis   . Stroke   . Thyroid disease     History reviewed. No pertinent past surgical history.  History reviewed. No pertinent family history. Social History:  reports that she has quit smoking. She does not have any smokeless tobacco history on file. She reports that she does not drink alcohol or use illicit drugs.  Allergies  No Known Allergies  Home Medications   (Not in a hospital admission)  Trauma Course   Results for orders placed during the hospital encounter of 12/19/13 (from the past 48 hour(s))  CBC WITH DIFFERENTIAL     Status: None   Collection Time   12/19/13  5:44 PM      Result Value Ref Range   WBC 10.2  4.0 - 10.5 K/uL   RBC 4.24  3.87 - 5.11 MIL/uL   Hemoglobin 12.7  12.0 - 15.0 g/dL   HCT 37.3  42.8 - 76.8 %   MCV 92.2  78.0 - 100.0 fL   MCH 30.0  26.0 - 34.0 pg   MCHC 32.5  30.0 - 36.0 g/dL   RDW 11.5  72.6 - 20.3 %   Platelets 272  150 - 400 K/uL   Neutrophils Relative % 72  43 - 77 %   Neutro Abs 7.3  1.7 - 7.7 K/uL   Lymphocytes Relative 20  12 - 46 %   Lymphs Abs 2.1  0.7 - 4.0 K/uL   Monocytes Relative 7  3 - 12 %   Monocytes Absolute 0.7  0.1 - 1.0 K/uL   Eosinophils Relative 1  0 - 5 %   Eosinophils Absolute 0.1  0.0 - 0.7 K/uL   Basophils Relative 0  0 - 1 %   Basophils Absolute 0.0  0.0 - 0.1 K/uL  COMPREHENSIVE METABOLIC PANEL     Status: Abnormal   Collection Time    12/19/13  5:44 PM      Result Value Ref Range   Sodium 144  137 - 147 mEq/L   Potassium 4.0  3.7 - 5.3 mEq/L   Chloride 105  96 - 112 mEq/L   CO2 27  19 - 32 mEq/L  Glucose, Bld 122 (*) 70 - 99 mg/dL   BUN 15  6 - 23 mg/dL   Creatinine, Ser 0.94  0.50 - 1.10 mg/dL   Calcium 10.1  8.4 - 10.5 mg/dL   Total Protein 7.9  6.0 - 8.3 g/dL   Albumin 4.2  3.5 - 5.2 g/dL   AST 20  0 - 37 U/L   ALT 12  0 - 35 U/L   Alkaline Phosphatase 109  39 - 117 U/L   Total Bilirubin 0.1 (*) 0.3 - 1.2 mg/dL   GFR calc non Af Amer 61 (*) >90 mL/min   GFR calc Af Amer 70 (*) >90 mL/min   Comment: (NOTE)     The eGFR has been calculated using the CKD EPI equation.     This calculation has not been validated in all clinical situations.     eGFR's persistently <90 mL/min signify possible Chronic Kidney     Disease.  APTT     Status: None   Collection Time    12/19/13  5:44 PM      Result Value Ref Range   aPTT 27  24 - 37 seconds  PROTIME-INR     Status: None   Collection Time    12/19/13  5:44 PM      Result Value Ref Range   Prothrombin Time 12.4  11.6 - 15.2 seconds   INR 0.94  0.00 - 1.49  SAMPLE TO BLOOD BANK     Status: None   Collection Time     12/19/13  5:48 PM      Result Value Ref Range   Blood Bank Specimen [id number]     Sample Expiration 12/20/2013    URINALYSIS, ROUTINE W REFLEX MICROSCOPIC     Status: None   Collection Time    12/19/13  5:50 PM      Result Value Ref Range   Color, Urine YELLOW  YELLOW   APPearance CLEAR  CLEAR   Specific Gravity, Urine 1.010  1.005 - 1.030   pH 7.0  5.0 - 8.0   Glucose, UA NEGATIVE  NEGATIVE mg/dL   Hgb urine dipstick NEGATIVE  NEGATIVE   Bilirubin Urine NEGATIVE  NEGATIVE   Ketones, ur NEGATIVE  NEGATIVE mg/dL   Protein, ur NEGATIVE  NEGATIVE mg/dL   Urobilinogen, UA 0.2  0.0 - 1.0 mg/dL   Nitrite NEGATIVE  NEGATIVE   Leukocytes, UA NEGATIVE  NEGATIVE   Comment: MICROSCOPIC NOT DONE ON URINES WITH NEGATIVE PROTEIN, BLOOD, LEUKOCYTES, NITRITE, OR GLUCOSE <1000 mg/dL.   Ct Head Wo Contrast  12/19/2013   CLINICAL DATA:  FALL  EXAM: CT HEAD WITHOUT CONTRAST  CT MAXILLOFACIAL WITHOUT CONTRAST  CT CERVICAL SPINE WITHOUT CONTRAST  TECHNIQUE: Multidetector CT imaging of the head, cervical spine, and maxillofacial structures were performed using the standard protocol without intravenous contrast. Multiplanar CT image reconstructions of the cervical spine and maxillofacial structures were also generated.  COMPARISON:  CT MAXILLOFACIAL W/O CM SINUS dated 12/19/2013  FINDINGS: CT HEAD FINDINGS  No acute intracranial abnormality. Specifically, no hemorrhage, hydrocephalus, mass lesion, acute infarction, or significant intracranial injury. Multiple left orbital fractures are partially visualized on this study and will be further characterized on dedicated maxillofacial CT. There are areas of subcutaneous emphysema as well as emphysema within the masticator space. Periorbital emphysema and soft tissue swelling is partially visualized on the left. The mastoid air cells are patent. There is no evidence of a depressed calvarial fracture.  CT MAXILLOFACIAL FINDINGS  Evaluation of the left orbit demonstrates  periorbital emphysema extending from the preseptal region to the soft tissues overlying the zygoma. There is diffuse periorbital soft tissue swelling. 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 There are areas of intermediate attenuation within the preseptal region likely representing component of hematoma and inflammatory changes. The post septal region appears intact. The globe demonstrates homogeneous attenuation and appears intact. The optic nerve and extraocular musculature appears intact.  Orbital and maxillary sinus fractures are appreciated on the left: A comminuted inferiorly displaced fracture is identified within the floor of the orbit. A mildly depressed fracture is identified along the lateral wall of the orbit. A comminuted fracture with depression is appreciated within the lateral aspect of the maxillary sinus. A mildly depressed fracture is appreciated within the posterior wall of the maxillary sinus. A nondisplaced fracture is appreciated within the medial wall of the maxillary sinus. There is a diffuse high attenuating fluid within the maxillary sinus consistent with hemorrhage.  The temporomandibular joints are intact as well as the mandible. The remaining paranasal sinuses appear aerated as well as the mastoid air cells.  A mildly depressed fracture is identified along the mid aspect of the nasal bone on the left.  CT CERVICAL SPINE FINDINGS  There is mild reversal of the normal cervical lordosis which may represent muscle spasm, collar placement, or positioning. There is no evidence of acute fracture or dislocation. There is no evidence of canal stenosis. Disc space narrowing is appreciated at the C5-6 level, mild. There is mild endplate sclerosis and peripheral hypertrophic spurring. The soft tissues of the neck are unremarkable. Mild apical scarring is identified within the left lung  apex.  IMPRESSION: 1. Multiple orbital and maxillary sinus fractures on the left. 2. No evidence of acute intracranial abnormalities 3. Mild reversal of the normal cervical lordosis likely secondary to collar placement, muscle spasm, and/or positioning. 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 No evidence of acute osseous abnormalities within the cervical spine.   Electronically Signed   By: Salome Holmes M.D.   On: 12/19/2013 16:49   Ct Cervical Spine Wo Contrast  12/19/2013   CLINICAL DATA:  FALL  EXAM: CT HEAD WITHOUT CONTRAST  CT MAXILLOFACIAL WITHOUT CONTRAST  CT CERVICAL SPINE WITHOUT CONTRAST  TECHNIQUE: Multidetector CT imaging of the head, cervical spine, and maxillofacial structures were performed using the standard protocol without intravenous contrast. Multiplanar CT image reconstructions of the cervical spine and maxillofacial structures were also generated.  COMPARISON:  CT MAXILLOFACIAL W/O CM SINUS dated 12/19/2013  FINDINGS: CT HEAD FINDINGS  No acute intracranial abnormality. Specifically, no hemorrhage, hydrocephalus, mass lesion, acute infarction, or significant intracranial injury. Multiple left orbital fractures are partially visualized on this study and will be further characterized on dedicated maxillofacial CT. There are areas of subcutaneous emphysema as well as emphysema within the masticator space. Periorbital emphysema and soft tissue swelling is partially visualized on the left. The mastoid air cells are patent. There is no evidence of a depressed calvarial fracture.  CT MAXILLOFACIAL FINDINGS  Evaluation of the left orbit demonstrates periorbital emphysema extending from the preseptal region to the soft tissues overlying the zygoma. There is diffuse periorbital soft tissue swelling. 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 There are areas of intermediate attenuation within  the preseptal region likely representing  component of hematoma and inflammatory changes. The post septal region appears intact. The globe demonstrates homogeneous attenuation and appears intact. The optic nerve and extraocular musculature appears intact.  Orbital and maxillary sinus fractures are appreciated on the left: A comminuted inferiorly displaced fracture is identified within the floor of the orbit. A mildly depressed fracture is identified along the lateral wall of the orbit. A comminuted fracture with depression is appreciated within the lateral aspect of the maxillary sinus. A mildly depressed fracture is appreciated within the posterior wall of the maxillary sinus. A nondisplaced fracture is appreciated within the medial wall of the maxillary sinus. There is a diffuse high attenuating fluid within the maxillary sinus consistent with hemorrhage.  The temporomandibular joints are intact as well as the mandible. The remaining paranasal sinuses appear aerated as well as the mastoid air cells.  A mildly depressed fracture is identified along the mid aspect of the nasal bone on the left.  CT CERVICAL SPINE FINDINGS  There is mild reversal of the normal cervical lordosis which may represent muscle spasm, collar placement, or positioning. There is no evidence of acute fracture or dislocation. There is no evidence of canal stenosis. Disc space narrowing is appreciated at the C5-6 level, mild. There is mild endplate sclerosis and peripheral hypertrophic spurring. The soft tissues of the neck are unremarkable. Mild apical scarring is identified within the left lung apex.  IMPRESSION: 1. Multiple orbital and maxillary sinus fractures on the left. 2. No evidence of acute intracranial abnormalities 3. Mild reversal of the normal cervical lordosis likely secondary to collar placement, muscle spasm, and/or positioning. 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 No evidence of acute osseous abnormalities within the  cervical spine.   Electronically Signed   By: Margaree Mackintosh M.D.   On: 12/19/2013 16:49   Dg Knee Complete 4 Views Left  12/19/2013   CLINICAL DATA:  Left knee pain after fall.  EXAM: LEFT KNEE - COMPLETE 4+ VIEW  COMPARISON:  None.  FINDINGS: There is no evidence of fracture, dislocation, or joint effusion. There is no evidence of arthropathy or other focal bone abnormality. Soft tissues are unremarkable.  IMPRESSION: Normal left knee.   Electronically Signed   By: Sabino Dick M.D.   On: 12/19/2013 15:57   Ct Maxillofacial Wo Cm  12/19/2013   CLINICAL DATA:  FALL  EXAM: CT HEAD WITHOUT CONTRAST  CT MAXILLOFACIAL WITHOUT CONTRAST  CT CERVICAL SPINE WITHOUT CONTRAST  TECHNIQUE: Multidetector CT imaging of the head, cervical spine, and maxillofacial structures were performed using the standard protocol without intravenous contrast. Multiplanar CT image reconstructions of the cervical spine and maxillofacial structures were also generated.  COMPARISON:  CT MAXILLOFACIAL W/O CM SINUS dated 12/19/2013  FINDINGS: CT HEAD FINDINGS  No acute intracranial abnormality. Specifically, no hemorrhage, hydrocephalus, mass lesion, acute infarction, or significant intracranial injury. Multiple left orbital fractures are partially visualized on this study and will be further characterized on dedicated maxillofacial CT. There are areas of subcutaneous emphysema as well as emphysema within the masticator space. Periorbital emphysema and soft tissue swelling is partially visualized on the left. The mastoid air cells are patent. There is no evidence of a depressed calvarial fracture.  CT MAXILLOFACIAL FINDINGS  Evaluation of the left orbit demonstrates periorbital emphysema extending from the preseptal region to the soft tissues overlying the zygoma. There is diffuse periorbital soft tissue swelling. 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 There are areas of intermediate attenuation within the preseptal region likely representing component of hematoma and inflammatory changes. The post septal region appears intact. The globe demonstrates homogeneous attenuation and appears intact. The optic nerve and extraocular musculature appears intact.  Orbital and maxillary sinus fractures are appreciated on the left: A comminuted inferiorly displaced fracture is identified within the floor of the orbit. A mildly depressed fracture is identified along the lateral wall of the orbit. A comminuted fracture with depression is appreciated within the lateral aspect of the maxillary sinus. A mildly depressed fracture is appreciated within the posterior wall of the maxillary sinus. A nondisplaced fracture is appreciated within the medial wall of the maxillary sinus. There is a diffuse high attenuating fluid within the maxillary sinus consistent with hemorrhage.  The temporomandibular joints are intact as well as the mandible. The remaining paranasal sinuses appear aerated as well as the mastoid air cells.  A mildly depressed fracture is identified along the mid aspect of the nasal bone on the left.  CT CERVICAL SPINE FINDINGS  There is mild reversal of the normal cervical lordosis which may represent muscle spasm, collar placement, or positioning. There is no evidence of acute fracture or dislocation. There is no evidence of canal stenosis. Disc space narrowing is appreciated at the C5-6 level, mild. There is mild endplate sclerosis and peripheral hypertrophic spurring. The soft tissues of the neck are unremarkable. Mild apical scarring is identified within the left lung apex.  IMPRESSION: 1. Multiple orbital and maxillary sinus fractures on the left. 2. No evidence of acute intracranial abnormalities 3. Mild reversal of the normal cervical lordosis likely secondary to collar placement, muscle spasm, and/or positioning. Degenerative disc disease  changes at C5-6. No evidence of acute osseous abnormalities within the cervical spine.   Electronically Signed   By: Margaree Mackintosh M.D.   On: 12/19/2013 16:49    Review of Systems  Constitutional: Negative for weight loss.  HENT: Negative for nosebleeds.   Eyes: Negative for blurred vision.  Respiratory: Negative for shortness of breath.   Cardiovascular: Negative for chest pain, palpitations, orthopnea and PND.       Denies DOE  Gastrointestinal: Negative for nausea, vomiting and abdominal pain.  Genitourinary: Negative for dysuria and hematuria.  Musculoskeletal: Positive for joint swelling. Negative for myalgias and neck pain.       Left knee pain  Skin: Negative for itching and rash.  Neurological: Positive for loss of consciousness and headaches. Negative for dizziness, tremors, sensory change, focal weakness, seizures, weakness and numbness.       Denies TIAs, amaurosis fugax  Endo/Heme/Allergies: Does not bruise/bleed easily.  Psychiatric/Behavioral: The patient is not nervous/anxious.     Blood pressure 152/61, pulse 71, temperature 98.1 F (36.7 C), temperature source Oral, resp. rate 18, height $RemoveBe'5\' 7"'pMqzBWPnd$  (1.702 m), weight 179 lb (81.194 kg), SpO2 100.00%. Physical Exam  Vitals reviewed. Constitutional: She is oriented to person, place, and time. She appears well-developed and well-nourished. No distress.  HENT:  Head: Normocephalic and atraumatic. Head is with left periorbital erythema.    Right Ear: External ear normal.  Left Ear: External ear normal.  Nose: Nose normal.  Mouth/Throat: Oropharynx is clear and moist.  Left periorbital ecchymosis/swelling/bony tenderness.   Eyes: Conjunctivae and EOM are normal. Pupils are equal, round, and reactive to light. No scleral icterus.  Neck: Normal range of motion. Neck supple. No tracheal deviation present. No thyromegaly present.  Cardiovascular: Normal rate, regular rhythm, normal  heart sounds and intact distal pulses.     Respiratory: Effort normal and breath sounds normal. No stridor. No respiratory distress. She has no wheezes.  GI: Soft. She exhibits no distension. There is no tenderness. There is no rebound.  Musculoskeletal: She exhibits no edema and no tenderness.       Left knee: She exhibits swelling. She exhibits no deformity.       Cervical back: Normal. She exhibits no tenderness and no bony tenderness.       Thoracic back: Normal. She exhibits no tenderness, no bony tenderness and no deformity.       Lumbar back: Normal. She exhibits no tenderness, no bony tenderness and no deformity.  Small bruise on L knee  Lymphadenopathy:    She has no cervical adenopathy.  Neurological: She is alert and oriented to person, place, and time. She is not disoriented. No cranial nerve deficit or sensory deficit. She exhibits normal muscle tone. GCS eye subscore is 4. GCS verbal subscore is 5. GCS motor subscore is 6.  Skin: Skin is warm and dry. No rash noted. She is not diaphoretic. No erythema. No pallor.  Psychiatric: She has a normal mood and affect. Her behavior is normal. Judgment and thought content normal.     Assessment/Plan S/p Fall with very brief LOC Closed head injury/concussion Multiple left sided facial fractures L knee pain/bruise Periorbital bruising   Appears pretty good now. Ox4. Appropriate for me. CT head negative for intracranial injury Admit for observation, serial neuro checks Consulted facial trauma - Dr Benjamine Mola - fu as output VTE prophylaxis Check cxr.   Leighton Ruff. Redmond Pulling, MD, FACS General, Bariatric, & Minimally Invasive Surgery [facility], [place]  [facility] M 12/19/2013, 8:26 PM   Procedures

## 2013-12-19 NOTE — ED Notes (Signed)
[place] at [place]hrs [place] [facility] [place]   Patient said she tripped over something and hit her head on the floor.  According to [facility] she fell face first.  [facility] and they did a head, neck Ct and Left knee xray.  She has multiple fractures to the left side of face.  Knee is stable.  Neck and back are cleared.  [facility] were [facility] and family said she is stuttering.  [facility] and the family told the MD and Dr. Andrey CampanileWilson was consulted.  [facility] transported her here to be evaluated further.  The patient does have left side deficits from a previous stroke two years ago.  However, she is at baseline.  Patient did [facility].  The patient rates her pain at an 8/10.  She was 4/10 when they picked [facility] up from [facility].

## 2013-12-19 NOTE — ED Notes (Signed)
Family at bedside. 

## 2013-12-19 NOTE — Discharge Instructions (Signed)
Concussion, Adult °A concussion, or closed-head injury, is a brain injury caused by a direct blow to the head or by a quick and sudden movement (jolt) of the head or neck. Concussions are usually not life-threatening. Even so, the effects of a concussion can be serious. If you have had a concussion before, you are more likely to experience concussion-like symptoms after a direct blow to the head.  °CAUSES  °· Direct blow to the head, such as from running into another player during a soccer game, being hit in a fight, or hitting your head on a hard surface. °· A jolt of the head or neck that causes the brain to move back and forth inside the skull, such as in a car crash. °SIGNS AND SYMPTOMS  °The signs of a concussion can be hard to notice. Early on, they may be missed by you, family members, and health care providers. You may look fine but act or feel differently. °Symptoms are usually temporary, but they may last for days, weeks, or even longer. Some symptoms may appear right away while others may not show up for hours or days. Every head injury is different. Symptoms include:  °· Mild to moderate headaches that will not go away. °· A feeling of pressure inside your head.  °· Having more trouble than usual:   °· Learning or remembering things you have heard. °· Answering questions.  °· Paying attention or concentrating.   °· Organizing daily tasks.   °· Making decisions and solving problems.   °· Slowness in thinking, acting or reacting, speaking, or reading.   °· Getting lost or being easily confused.   °· Feeling tired all the time or lacking energy (fatigued).   °· Feeling drowsy.   °· Sleep disturbances.   °· Sleeping more than usual.   °· Sleeping less than usual.   °· Trouble falling asleep.   °· Trouble sleeping (insomnia).   °· Loss of balance or feeling lightheaded or dizzy.   °· Nausea or vomiting.   °· Numbness or tingling.   °· Increased sensitivity to:   °· Sounds.   °· Lights.   °· Distractions.    °· Vision problems or eyes that tire easily.   °· Diminished sense of taste or smell.   °· Ringing in the ears.   °· Mood changes such as feeling sad or anxious.   °· Becoming easily irritated or angry for little or no reason.   °· Lack of motivation. °· Seeing or hearing things other people do not see or hear (hallucinations). °DIAGNOSIS  °Your health care provider can usually diagnose a concussion based on a description of your injury and symptoms. He or she will ask whether you passed out (lost consciousness) and whether you are having trouble remembering events that happened right before and during your injury.  °Your evaluation might include:  °· A brain scan to look for signs of injury to the brain. Even if the test shows no injury, you may still have a concussion.   °· Blood tests to be sure other problems are not present. °TREATMENT  °· Concussions are usually treated in an emergency department, in urgent care, or at a clinic. You may need to stay in the hospital overnight for further treatment.   °· Tell your health care provider if you are taking any medicines, including prescription medicines, over-the-counter medicines, and natural remedies. Some medicines, such as blood thinners (anticoagulants) and aspirin, may increase the chance of complications. Also tell your health care provider whether you have had alcohol or are taking illegal drugs. This information may affect treatment. °· Your health care provider will send you   home with important instructions to follow. °· How fast you will recover from a concussion depends on many factors. These factors include how severe your concussion is, what part of your brain was injured, your age, and how healthy you were before the concussion. °· Most people with mild injuries recover fully. Recovery can take time. In general, recovery is slower in older persons. Also, persons who have had a concussion in the past or have other medical problems may find that it  takes longer to recover from their current injury. °HOME CARE INSTRUCTIONS  °General Instructions °· Carefully follow the directions your health care provider gave you. °· Only take over-the-counter or prescription medicines for pain, discomfort, or fever as directed by your health care provider. °· Take only those medicines that your health care provider has approved. °· Do not drink alcohol until your health care provider says you are well enough to do so. Alcohol and certain other drugs may slow your recovery and can put you at risk of further injury. °· If it is harder than usual to remember things, write them down. °· If you are easily distracted, try to do one thing at a time. For example, do not try to watch TV while fixing dinner. °· Talk with family members or close friends when making important decisions. °· Keep all follow-up appointments. Repeated evaluation of your symptoms is recommended for your recovery. °· Watch your symptoms and tell others to do the same. Complications sometimes occur after a concussion. Older adults with a brain injury may have a higher risk of serious complications such as of a blood clot on the brain. °· Tell your teachers, school nurse, school counselor, coach, athletic trainer, or work manager about your injury, symptoms, and restrictions. Tell them about what you can or cannot do. They should watch for:   °· Increased problems with attention or concentration.   °· Increased difficulty remembering or learning new information.   °· Increased time needed to complete tasks or assignments.   °· Increased irritability or decreased ability to cope with stress.   °· Increased symptoms.   °· Rest. Rest helps the brain to heal. Make sure you: °· Get plenty of sleep at night. Avoid staying up late at night. °· Keep the same bedtime hours on weekends and weekdays. °· Rest during the day. Take daytime naps or rest breaks when you feel tired. °· Limit activities that require a lot of  thought or concentration. These includes   °· Doing homework or job-related work.   °· Watching TV.   °· Working on the computer. °· Avoid any situation where there is potential for another head injury (football, hockey, soccer, basketball, martial arts, downhill snow sports and horseback riding). Your condition will get worse every time you experience a concussion. You should avoid these activities until you are evaluated by the appropriate follow-up caregivers. °Returning To Your Regular Activities °You will need to return to your normal activities slowly, not all at once. You must give your body and brain enough time for recovery. °· Do not return to sports or other athletic activities until your health care provider tells you it is safe to do so. °· Ask your health care provider when you can drive, ride a bicycle, or operate heavy machinery. Your ability to react may be slower after a brain injury. Never do these activities if you are dizzy. °· Ask your health care provider about when you can return to work or school. °Preventing Another Concussion °It is very important to avoid another   brain injury, especially before you have recovered. In rare cases, another injury can lead to permanent brain damage, brain swelling, or death. The risk of this is greatest during the first 7 10 days after a head injury. Avoid injuries by:   Wearing a seat belt when riding in a car.   Drinking alcohol only in moderation.   Wearing a helmet when biking, skiing, skateboarding, skating, or doing similar activities.  Avoiding activities that could lead to a second concussion, such as contact or recreational sports, until your health care provider says it is OK.  Taking safety measures in your home.   Remove clutter and tripping hazards from floors and stairways.   Use grab bars in bathrooms and handrails by stairs.   Place non-slip mats on floors and in bathtubs.   Improve lighting in dim areas. SEEK MEDICAL  CARE IF:   You have increased problems paying attention or concentrating.   You have increased difficulty remembering or learning new information.   You need more time to complete tasks or assignments than before.   You have increased irritability or decreased ability to cope with stress.  You have more symptoms than before. Seek medical care if you have any of the following symptoms for more than 2 weeks after your injury:   Lasting (chronic) headaches.   Dizziness or balance problems.   Nausea.  Vision problems.   Increased sensitivity to noise or light.   Depression or mood swings.   Anxiety or irritability.   Memory problems.   Difficulty concentrating or paying attention.   Sleep problems.   Feeling tired all the time. SEEK IMMEDIATE MEDICAL CARE IF:   You have severe or worsening headaches. These may be a sign of a blood clot in the brain.  You have weakness (even if only in one hand, leg, or part of the face).  You have numbness.  You have decreased coordination.   You vomit repeatedly.  You have increased sleepiness.  One pupil is larger than the other.   You have convulsions.   You have slurred speech.   You have increased confusion. This may be a sign of a blood clot in the brain.  You have increased restlessness, agitation, or irritability.   You are unable to recognize people or places.   You have neck pain.   It is difficult to wake you up.   You have unusual behavior changes.   You lose consciousness. MAKE SURE YOU:   Understand these instructions.  Will watch your condition.  Will get help right away if you are not doing well or get worse. Document Released: 12/23/2003 Document Revised: 06/04/2013 Document Reviewed: 04/24/2013 [facility] Patient Information 2014 [place], [place].  Head Injury, Adult You have received a head injury. It does not appear serious at this time. Headaches and vomiting are common  following head injury. It should be easy to awaken from sleeping. Sometimes it is necessary for you to stay in the emergency department for a while for observation. Sometimes admission to the hospital may be needed. After injuries such as yours, most problems occur within the first 24 hours, but side effects may occur up to 7 10 days after the injury. It is important for you to carefully monitor your condition and contact your health care provider or seek immediate medical care if there is a change in your condition. WHAT ARE THE TYPES OF HEAD INJURIES? Head injuries can be as minor as a bump. Some head injuries can  be more severe. More severe head injuries include:  A jarring injury to the brain (concussion).  A bruise of the brain (contusion). This mean there is bleeding in the brain that can cause swelling.  A cracked skull (skull fracture).  Bleeding in the brain that collects, clots, and forms a bump (hematoma). WHAT CAUSES A HEAD INJURY? A serious head injury is most likely to happen to someone who is in a car wreck and is not wearing a seat belt. Other causes of major head injuries include bicycle or motorcycle accidents, sports injuries, and falls. HOW ARE HEAD INJURIES DIAGNOSED? A complete history of the event leading to the injury and your current symptoms will be helpful in diagnosing head injuries. Many times, pictures of the brain, such as CT or MRI are needed to see the extent of the injury. Often, an overnight hospital stay is necessary for observation.  WHEN SHOULD I SEEK IMMEDIATE MEDICAL CARE?  You should get help right away if:  You have confusion or drowsiness.  You feel sick to your stomach (nauseous) or have continued, forceful vomiting.  You have dizziness or unsteadiness that is getting worse.  You have severe, continued headaches not relieved by medicine. Only take over-the-counter or prescription medicines for pain, fever, or discomfort as directed by your health  care provider.  You do not have normal function of the arms or legs or are unable to walk.  You notice changes in the black spots in the center of the colored part of your eye (pupil).  You have a clear or bloody fluid coming from your nose or ears.  You have a loss of vision. During the next 24 hours after the injury, you must stay with someone who can watch you for the warning signs. This person should contact local emergency services (911 in the [place] if you have seizures, you become unconscious, or you are unable to wake up. HOW CAN I PREVENT A HEAD INJURY IN THE FUTURE? The most important factor for preventing major head injuries is avoiding motor vehicle accidents. To minimize the potential for damage to your head, it is crucial to wear seat belts while riding in motor vehicles. Wearing helmets while bike riding and playing collision sports (like football) is also helpful. Also, avoiding dangerous activities around the house will further help reduce your risk of head injury.  WHEN CAN I RETURN TO NORMAL ACTIVITIES AND ATHLETICS? You should be reevaluated by your health care provider before returning to these activities. If you have any of the following symptoms, you should not return to activities or contact sports until 1 week after the symptoms have stopped:  Persistent headache.  Dizziness or vertigo.  Poor attention and concentration.  Confusion.  Memory problems.  Nausea or vomiting.  Fatigue or tire easily.  Irritability.  Intolerant of bright lights or loud noises.  Anxiety or depression.  Disturbed sleep. MAKE SURE YOU:   Understand these instructions.  Will watch your condition.  Will get help right away if you are not doing well or get worse. Document Released: 10/02/2005 Document Revised: 07/23/2013 Document Reviewed: 06/09/2013 [facility] Patient Information 2014 [place]  Nasal Fracture A nasal fracture is a break or crack in the bones of the  nose. A minor break usually heals in a month. You often will receive black eyes from a nasal fracture. This is not a cause for concern. The black eyes will go away over 1 to 2 weeks.  DIAGNOSIS  Your caregiver may  want to examine you if you are concerned about a fracture of the nose. X-rays of the nose may not show a nasal fracture even when one is present. Sometimes your caregiver must wait 1 to 5 days after the injury to re-check the nose for alignment and to take additional X-rays. Sometimes the caregiver must wait until the swelling has gone down. TREATMENT Minor fractures that have caused no deformity often do not require treatment. More serious fractures where bones are displaced may require surgery. This will take place after the swelling is gone. Surgery will stabilize and align the fracture. HOME CARE INSTRUCTIONS   Put ice on the injured area.  Put ice in a plastic bag.  Place a towel between your skin and the bag.  Leave the ice on for 15-20 minutes, 03-04 times a day.  Take medications as directed by your caregiver.  Only take over-the-counter or prescription medicines for pain, discomfort, or fever as directed by your caregiver.  If your nose starts bleeding, squeeze the soft parts of the nose against the center wall while you are sitting in an upright position for 15 minutes.  Contact sports should be avoided for at least 3 to 4 weeks or as directed by your caregiver. SEEK MEDICAL CARE IF:  Your pain increases or becomes severe.  You continue to have nosebleeds.  The shape of your nose does not return to normal within 5 days.  You have pus draining from the nose. SEEK IMMEDIATE MEDICAL CARE IF:   You have bleeding from your nose that does not stop after 20 minutes of pinching the nostrils closed and keeping ice on the nose.  You have clear fluid draining from your nose.  You notice a grape-like swelling on the dividing wall between the nostrils (septum). This is a  collection of blood (hematoma) that must be drained to help prevent infection.  You have difficulty moving your eyes.  You have recurrent vomiting. Document Released: 09/29/2000 Document Revised: 12/25/2011 Document Reviewed: 01/16/2011 [facility] Patient Information 2014 [place], [place].  Facial Fracture A facial fracture is a break in one of the bones of your face. HOME CARE INSTRUCTIONS   Protect the injured part of your face until it is healed.  Do not participate in activities which give chance for re-injury until your doctor approves.  Gently wash and dry your face.  Wear head and facial protection while riding a bicycle, motorcycle, or snowmobile.  Keep your head elevated when you are sleeping.  Do not blow your nose for the next week.  Eat a soft diet for the next several days.  You may apply ice to your face several times a day for 15-20 minutes at a time. SEEK MEDICAL CARE IF:   An oral temperature above 102 F (38.9 C) develops.  You have severe headaches or notice changes in your vision.  You have new numbness or tingling in your face.  You develop nausea (feeling sick to your stomach), vomiting or a stiff neck. SEEK IMMEDIATE MEDICAL CARE IF:   You develop difficulty seeing or experience double vision.  You become dizzy, lightheaded, or faint.  You develop trouble speaking, breathing, or swallowing.  You have a watery discharge from your nose or ear. MAKE SURE YOU:   Understand these instructions.  Will watch your condition.  Will get help right away if you are not doing well or get worse. Document Released: 10/02/2005 Document Revised: 12/25/2011 Document Reviewed: 05/21/2008 [facility] Patient Information 2014 [place], [place].

## 2013-12-19 NOTE — ED Notes (Addendum)
Per EMS, pt was shopping in [employer] and tripped over a mat in the floor. Pt fell and hit left side of face and head. Mild swelling noted to left eye. Pt denies LOC.Pt denied any dizziness at time of fall. Pt alert and oriented. Airway patent. nad noted.

## 2013-12-19 NOTE — ED Provider Notes (Addendum)
TIME SEEN: 3:31 PM  CHIEF COMPLAINT: Fall  HPI: Patient is a 68 year old female with a history of stroke with mild residual left-sided weakness, hyperlipidemia, hypothyroidism who presents to the emergency department after she had a mechanical fall today. Patient reports she was at [employer] with her daughter when she turned around and tripped over something on the floor. She landed on her left side and struck her face. She feels she did lose consciousness for several seconds. She is having left-sided facial pain and left knee pain. She is on Plavix. No new numbness, tingling or weakness. No chest pain shortness of breath. No abdominal pain. She is complaining of left facial pain and left knee pain.  ROS: See HPI Constitutional: no fever  Eyes: no drainage  ENT: no runny nose   Cardiovascular:  no chest pain  Resp: no SOB  GI: no vomiting GU: no dysuria Integumentary: no rash  Allergy: no hives  Musculoskeletal: no leg swelling  Neurological: no slurred speech ROS otherwise negative  PAST MEDICAL HISTORY/PAST SURGICAL HISTORY:  Past Medical History  Diagnosis Date  . Hyperlipidemia   . Arthritis   . Stroke   . Thyroid disease     MEDICATIONS:  Prior to Admission medications   Medication Sig Start Date End Date Taking? Authorizing Provider  aspirin EC 81 MG tablet Take 81 mg by mouth daily.   Yes Historical Provider, MD  clopidogrel (PLAVIX) 75 MG tablet Take 75 mg by mouth daily with breakfast.   Yes Historical Provider, MD  levothyroxine (SYNTHROID, LEVOTHROID) 50 MCG tablet Take 50 mcg by mouth daily before breakfast.   Yes Historical Provider, MD  Multiple Vitamin (MULITIVITAMIN WITH MINERALS) TABS Take 1 tablet by mouth daily.   Yes Historical Provider, MD  pravastatin (PRAVACHOL) 80 MG tablet Take 80 mg by mouth daily.   Yes Historical Provider, MD  acetaminophen (TYLENOL) 500 MG tablet Take 1,000 mg by mouth every 6 (six) hours as needed. For pain    Historical Provider, MD   diphenhydrAMINE (BENADRYL) 25 MG tablet Take 25 mg by mouth every 6 (six) hours as needed. For allergies    Historical Provider, MD  levothyroxine (SYNTHROID, LEVOTHROID) 50 MCG tablet Take 1 tablet (50 mcg total) by mouth daily before breakfast. 12/27/11 12/26/12  Fredirick Maudlin, MD  naproxen sodium (ANAPROX) 220 MG tablet Take 220 mg by mouth every 8 (eight) hours as needed. Arthritis pain    Historical Provider, MD    ALLERGIES:  No Known Allergies  SOCIAL HISTORY:  History  Substance Use Topics  . Smoking status: Former Games developer  . Smokeless tobacco: Not on file  . Alcohol Use: No    FAMILY HISTORY: History reviewed. No pertinent family history.  EXAM: BP 166/67  Pulse 91  Temp(Src) 98.2 F (36.8 C) (Oral)  Resp 18  Ht 5\' 7"  (1.702 m)  Wt 179 lb (81.194 kg)  BMI 28.03 kg/m2  SpO2 100% CONSTITUTIONAL: Alert and oriented and responds appropriately to questions. Well-appearing; well-nourished; GCS 15 HEAD: Normocephalic; atraumatic EYES: Conjunctivae clear, PERRL, EOMI; there is a mild amount of left periorbital swelling; no proptosis; patient reports slightly blurry vision with her left eye but Visual Acuity is normal - Bilateral Distance: 20/30 ; R Distance: 20/50 ; L Distance: 20/50 ENT: normal nose; no rhinorrhea; moist mucous membranes; pharynx without lesions noted; no dental injury; patient has poor dentition at baseline, no hemotypanum; no septal hematoma; patient does have small amount of blood in her left nostril; patient is  tender to palpation diffusely over her left face without obvious bony deformity, midface is stable; patient is a minimal amount of swelling to the left upper lip NECK: Supple, no meningismus, no LAD; no midline spinal tenderness, step-off or deformity CARD: RRR; S1 and S2 appreciated; no murmurs, no clicks, no rubs, no gallops RESP: Normal chest excursion without splinting or tachypnea; breath sounds clear and equal bilaterally; no wheezes, no  rhonchi, no rales; chest wall stable, nontender to palpation ABD/GI: Normal bowel sounds; non-distended; soft, non-tender, no rebound, no guarding PELVIS:  stable, nontender to palpation BACK:  The back appears normal and is non-tender to palpation, there is no CVA tenderness; no midline spinal tenderness, step-off or deformity EXT: Small abrasion and mild tenderness over the left patella, no bony deformity, no joint effusion, Normal ROM in all joints; non-tender to palpation; no edema; normal capillary refill; no cyanosis    SKIN: Normal color for age and race; warm NEURO: Moves all extremities equally, sensation to light touch intact diffusely, patient has mild left facial droop which is chronic PSYCH: The patient's mood and manner are appropriate. Grooming and personal hygiene are appropriate.  MEDICAL DECISION MAKING: Patient with mechanical fall. We'll obtain CT imaging of her head, cervical spine, face and left knee. We'll give oral pain medication. She is at her neurologic baseline is hemodynamically stable.  ED PROGRESS: Patient has multiple left-sided maxillary sinus and orbital fractures with no entrapment. Her extraocular movements are intact. Her pain is controlled. She still has no new neurologic deficits. Have discussed with family strict return precautions for head injury, supportive care instructions. Will discharge home with pain medication and follow up with ENT. Patient's family and the patient verbalizes understanding and are comfortable plan.   5:37 PM  Pt's family is concerned bc pt keeps repeating herself throughout her ED stay.  Likely post concussive.  Will continue to closely monitor.  [facility]   5:51 PM  Spoke with Dr. Gaynelle AduEric Bluestone with trauma surgery.  He recommends discussing with hospitalist to see if they would feel comfortable [facility].  If not[facility]  6:15 PM   Spoke with Dr. Karilyn CotaGosrani, hospitalist who did not feel comfortable admitting the patient here at any pending. Discussed again with Dr. Gaynelle AduEric Knock who [facility]. Patient accepted by Dr. Rosario JacksJacobowitz.    Kristen N Ward, DO 12/19/13 1718  Layla MawKristen N Ward, DO 12/19/13 1723  Layla MawKristen N Ward, DO 12/19/13 [id number]

## 2013-12-19 NOTE — ED Notes (Signed)
Went in to get pt d/c v/s. Pt family reports "my mom is not acting right." pt family reports pt keeps repeating herself and have aggressive outbursts.EDP aware and reported would run additional tests and re-assess pt after results comeback.Pt and pt family aware and verbalized understanding.

## 2013-12-20 ENCOUNTER — Encounter (HOSPITAL_COMMUNITY): Payer: Self-pay | Admitting: General Practice

## 2013-12-20 LAB — CBC
HEMATOCRIT: 34.2 % — AB (ref 36.0–46.0)
Hemoglobin: 11.1 g/dL — ABNORMAL LOW (ref 12.0–15.0)
MCH: 29.8 pg (ref 26.0–34.0)
MCHC: 32.5 g/dL (ref 30.0–36.0)
MCV: 91.9 fL (ref 78.0–100.0)
Platelets: 227 10*3/uL (ref 150–400)
RBC: 3.72 MIL/uL — ABNORMAL LOW (ref 3.87–5.11)
RDW: 14.9 % (ref 11.5–15.5)
WBC: 7.4 10*3/uL (ref 4.0–10.5)

## 2013-12-20 LAB — BASIC METABOLIC PANEL
BUN: 12 mg/dL (ref 6–23)
CO2: 24 mEq/L (ref 19–32)
CREATININE: 0.77 mg/dL (ref 0.50–1.10)
Calcium: 9.4 mg/dL (ref 8.4–10.5)
Chloride: 108 mEq/L (ref 96–112)
GFR calc Af Amer: >90 mL/min (ref >90)
GFR, EST NON AFRICAN AMERICAN: 84 mL/min — AB (ref >90)
GLUCOSE: 88 mg/dL (ref 70–99)
POTASSIUM: 4.3 meq/L (ref 3.7–5.3)
Sodium: 143 mEq/L (ref 137–147)

## 2013-12-20 NOTE — Progress Notes (Signed)
LOS: 1 day   Subjective: Pt feels okay.  No N/V, tolerated full liquids well.  Ambulated in the room.  C/p pain in face/head, headache, mild left knee pain.  No other complaints.  No blurred vision or hearing loss, some pain with eye movements in left eye.  No chest or abdominal pain.  No other extremities bothering her.    Objective: Vital signs in last 24 hours: Temp:  [97.5 F (36.4 C)-98.4 F (36.9 C)] 97.6 F (36.4 C) (03/07 0536) Pulse Rate:  [60-91] 69 (03/07 0536) Resp:  [18-20] 20 (03/07 0536) BP: (116-167)/(61-77) 130/61 mmHg (03/07 0536) SpO2:  [98 %-100 %] 100 % (03/07 0536) Weight:  [179 lb (81.194 kg)-185 lb 10 oz (84.2 kg)] 185 lb 10 oz (84.2 kg) (03/06 2350) Last BM Date: 12/19/13  Lab Results:  CBC  Recent Labs  12/19/13 1744 12/20/13 0410  WBC 10.2 7.4  HGB 12.7 11.1*  HCT 39.1 34.2*  PLT 272 227   BMET  Recent Labs  12/19/13 1744 12/20/13 0410  NA 144 143  K 4.0 4.3  CL 105 108  CO2 27 24  GLUCOSE 122* 88  BUN 15 12  CREATININE 0.94 0.77  CALCIUM 10.1 9.4    Imaging: Ct Head Wo Contrast  12/19/2013   CLINICAL DATA:  FALL  EXAM: CT HEAD WITHOUT CONTRAST  CT MAXILLOFACIAL WITHOUT CONTRAST  CT CERVICAL SPINE WITHOUT CONTRAST  TECHNIQUE: Multidetector CT imaging of the head, cervical spine, and maxillofacial structures were performed using the standard protocol without intravenous contrast. Multiplanar CT image reconstructions of the cervical spine and maxillofacial structures were also generated.  COMPARISON:  CT MAXILLOFACIAL W/O CM SINUS dated 12/19/2013  FINDINGS: CT HEAD FINDINGS  No acute intracranial abnormality. Specifically, no hemorrhage, hydrocephalus, mass lesion, acute infarction, or significant intracranial injury. Multiple left orbital fractures are partially visualized on this study and will be further characterized on dedicated maxillofacial CT. There are areas of subcutaneous emphysema as well as emphysema within the masticator space.  Periorbital emphysema and soft tissue swelling is partially visualized on the left. The mastoid air cells are patent. There is no evidence of a depressed calvarial fracture.  CT MAXILLOFACIAL FINDINGS  Evaluation of the left orbit demonstrates periorbital emphysema extending from the preseptal region to the soft tissues overlying the zygoma. There is diffuse periorbital soft tissue swelling. 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 There are areas of intermediate attenuation within the preseptal region likely representing component of hematoma and inflammatory changes. The post septal region appears intact. The globe demonstrates homogeneous attenuation and appears intact. The optic nerve and extraocular musculature appears intact.  Orbital and maxillary sinus fractures are appreciated on the left: A comminuted inferiorly displaced fracture is identified within the floor of the orbit. A mildly depressed fracture is identified along the lateral wall of the orbit. A comminuted fracture with depression is appreciated within the lateral aspect of the maxillary sinus. A mildly depressed fracture is appreciated within the posterior wall of the maxillary sinus. A nondisplaced fracture is appreciated within the medial wall of the maxillary sinus. There is a diffuse high attenuating fluid within the maxillary sinus consistent with hemorrhage.  The temporomandibular joints are intact as well as the mandible. The remaining paranasal sinuses appear aerated as well as the mastoid air cells.  A mildly depressed fracture is identified along the mid aspect of the nasal bone on  the left.  CT CERVICAL SPINE FINDINGS  There is mild reversal of the normal cervical lordosis which may represent muscle spasm, collar placement, or positioning. There is no evidence of acute fracture or dislocation. There is no evidence of canal  stenosis. Disc space narrowing is appreciated at the C5-6 level, mild. There is mild endplate sclerosis and peripheral hypertrophic spurring. The soft tissues of the neck are unremarkable. Mild apical scarring is identified within the left lung apex.  IMPRESSION: 1. Multiple orbital and maxillary sinus fractures on the left. 2. No evidence of acute intracranial abnormalities 3. Mild reversal of the normal cervical lordosis likely secondary to collar placement, muscle spasm, and/or positioning. 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 No evidence of acute osseous abnormalities within the cervical spine.   Electronically Signed   By: Salome Holmes M.D.   On: 12/19/2013 16:49   Ct Cervical Spine Wo Contrast  12/19/2013   CLINICAL DATA:  FALL  EXAM: CT HEAD WITHOUT CONTRAST  CT MAXILLOFACIAL WITHOUT CONTRAST  CT CERVICAL SPINE WITHOUT CONTRAST  TECHNIQUE: Multidetector CT imaging of the head, cervical spine, and maxillofacial structures were performed using the standard protocol without intravenous contrast. Multiplanar CT image reconstructions of the cervical spine and maxillofacial structures were also generated.  COMPARISON:  CT MAXILLOFACIAL W/O CM SINUS dated 12/19/2013  FINDINGS: CT HEAD FINDINGS  No acute intracranial abnormality. Specifically, no hemorrhage, hydrocephalus, mass lesion, acute infarction, or significant intracranial injury. Multiple left orbital fractures are partially visualized on this study and will be further characterized on dedicated maxillofacial CT. There are areas of subcutaneous emphysema as well as emphysema within the masticator space. Periorbital emphysema and soft tissue swelling is partially visualized on the left. The mastoid air cells are patent. There is no evidence of a depressed calvarial fracture.  CT MAXILLOFACIAL FINDINGS  Evaluation of the left orbit demonstrates periorbital emphysema extending from the preseptal region to the soft tissues overlying the zygoma. There is  diffuse periorbital soft tissue swelling. 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 There are areas of intermediate attenuation within the preseptal region likely representing component of hematoma and inflammatory changes. The post septal region appears intact. The globe demonstrates homogeneous attenuation and appears intact. The optic nerve and extraocular musculature appears intact.  Orbital and maxillary sinus fractures are appreciated on the left: A comminuted inferiorly displaced fracture is identified within the floor of the orbit. A mildly depressed fracture is identified along the lateral wall of the orbit. A comminuted fracture with depression is appreciated within the lateral aspect of the maxillary sinus. A mildly depressed fracture is appreciated within the posterior wall of the maxillary sinus. A nondisplaced fracture is appreciated within the medial wall of the maxillary sinus. There is a diffuse high attenuating fluid within the maxillary sinus consistent with hemorrhage.  The temporomandibular joints are intact as well as the mandible. The remaining paranasal sinuses appear aerated as well as the mastoid air cells.  A mildly depressed fracture is identified along the mid aspect of the nasal bone on the left.  CT CERVICAL SPINE FINDINGS  There is mild reversal of the normal cervical lordosis which may represent muscle spasm, collar placement, or positioning. There is no evidence of acute fracture or dislocation. There is no evidence of canal stenosis. Disc space narrowing is appreciated at the C5-6 level, mild. There is mild endplate sclerosis and peripheral hypertrophic spurring. The soft  tissues of the neck are unremarkable. Mild apical scarring is identified within the left lung apex.  IMPRESSION: 1. Multiple orbital and maxillary sinus fractures on the left. 2. No evidence of acute  intracranial abnormalities 3. Mild reversal of the normal cervical lordosis likely secondary to collar placement, muscle spasm, and/or positioning. 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 No evidence of acute osseous abnormalities within the cervical spine.   Electronically Signed   By: Salome Holmes M.D.   On: 12/19/2013 16:49   Dg Chest Port 1 View  12/19/2013   CLINICAL DATA:  Fall.  Loss of consciousness.  EXAM: PORTABLE CHEST - 1 VIEW  COMPARISON:  DG RIBS UNILATERAL W/CHEST*R* dated 05/08/2008  FINDINGS: The heart size and mediastinal contours are within normal limits. Both lungs are clear. The visualized skeletal structures are unremarkable.  IMPRESSION: No active disease.   Electronically Signed   By: Charlett Nose M.D.   On: 12/19/2013 20:43   Dg Knee Complete 4 Views Left  12/19/2013   CLINICAL DATA:  Left knee pain after fall.  EXAM: LEFT KNEE - COMPLETE 4+ VIEW  COMPARISON:  None.  FINDINGS: There is no evidence of fracture, dislocation, or joint effusion. There is no evidence of arthropathy or other focal bone abnormality. Soft tissues are unremarkable.  IMPRESSION: Normal left knee.   Electronically Signed   By: Roque Lias M.D.   On: 12/19/2013 15:57   Ct Maxillofacial Wo Cm  12/19/2013   CLINICAL DATA:  FALL  EXAM: CT HEAD WITHOUT CONTRAST  CT MAXILLOFACIAL WITHOUT CONTRAST  CT CERVICAL SPINE WITHOUT CONTRAST  TECHNIQUE: Multidetector CT imaging of the head, cervical spine, and maxillofacial structures were performed using the standard protocol without intravenous contrast. Multiplanar CT image reconstructions of the cervical spine and maxillofacial structures were also generated.  COMPARISON:  CT MAXILLOFACIAL W/O CM SINUS dated 12/19/2013  FINDINGS: CT HEAD FINDINGS  No acute intracranial abnormality. Specifically, no hemorrhage, hydrocephalus, mass lesion, acute infarction, or significant intracranial injury. Multiple left orbital fractures are partially visualized on this study and will be  further characterized on dedicated maxillofacial CT. There are areas of subcutaneous emphysema as well as emphysema within the masticator space. Periorbital emphysema and soft tissue swelling is partially visualized on the left. The mastoid air cells are patent. There is no evidence of a depressed calvarial fracture.  CT MAXILLOFACIAL FINDINGS  Evaluation of the left orbit demonstrates periorbital emphysema extending from the preseptal region to the soft tissues overlying the zygoma. There is diffuse periorbital soft tissue swelling. 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 There are areas of intermediate attenuation within the preseptal region likely representing component of hematoma and inflammatory changes. The post septal region appears intact. The globe demonstrates homogeneous attenuation and appears intact. The optic nerve and extraocular musculature appears intact.  Orbital and maxillary sinus fractures are appreciated on the left: A comminuted inferiorly displaced fracture is identified within the floor of the orbit. A mildly depressed fracture is identified along the lateral wall of the orbit. A comminuted fracture with depression is appreciated within the lateral aspect of the maxillary sinus. A mildly depressed fracture is appreciated within the posterior wall of the maxillary sinus. A nondisplaced fracture is appreciated within the medial wall of the maxillary sinus. There is a diffuse high attenuating fluid within the maxillary sinus consistent with hemorrhage.  The temporomandibular joints are intact as well as  the mandible. The remaining paranasal sinuses appear aerated as well as the mastoid air cells.  A mildly depressed fracture is identified along the mid aspect of the nasal bone on the left.  CT CERVICAL SPINE FINDINGS  There is mild reversal of the normal cervical lordosis which may  represent muscle spasm, collar placement, or positioning. There is no evidence of acute fracture or dislocation. There is no evidence of canal stenosis. Disc space narrowing is appreciated at the C5-6 level, mild. There is mild endplate sclerosis and peripheral hypertrophic spurring. The soft tissues of the neck are unremarkable. Mild apical scarring is identified within the left lung apex.  IMPRESSION: 1. Multiple orbital and maxillary sinus fractures on the left. 2. No evidence of acute intracranial abnormalities 3. Mild reversal of the normal cervical lordosis likely secondary to collar placement, muscle spasm, and/or positioning. 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 No evidence of acute osseous abnormalities within the cervical spine.   Electronically Signed   By: Salome HolmesHector  Cooper M.D.   On: 12/19/2013 16:49     PE: General: pleasant, WD/WN AA female who is laying in bed in NAD HEENT: head is normocephalic, left periorbital edema/ecchymosis.  Sclera are noninjected.  PERRL.  Ears and nose without any masses or lesions.  Mouth is pink and moist.  EOM's intact but pain with end ranges of left eye movement.  Nose bleeding posteriorly.  Heart: regular, rate, and rhythm.  Normal s1,s2. No obvious murmurs, gallops, or rubs noted.  Palpable radial and pedal pulses bilaterally Lungs: CTAB, no wheezes, rhonchi, or rales noted.  Respiratory effort nonlabored, chest wall non-tender. Abd: soft, NT/ND, +BS, no masses, hernias, or organomegaly MS: all 4 extremities are symmetrical with no cyanosis, clubbing.  Left knee with mild edema and slight abrasion to patella, good ROM, CSM intact b/l Skin: other than left knee and face warm and dry with no masses, lesions, or rashes Psych: A&Ox3 with an appropriate affect.   Assessment/Plan: S/p Fall with very brief LOC  Closed head injury/concussion  Multiple left sided facial fractures - pending eval by Dr. Suszanne Connerseoh L knee pain/bruise  Periorbital bruising  ABL  anemia - mild VTE - SCD's, Lovenox FEN - [facility] -- PT/OT/SLP and ENT eval, possibly d/c today or tomorow  Aris GeorgiaMegan Dort, [place] JerseyPA-C Pager: [phone] General Trauma PA Pager: [phone]   12/20/2013  Agree with above. Daughter, Kathie RhodesBetty, in room. Looks pretty good.  Ovidio Kinavid Maudry Zeidan, MD, [facility] Pager: [phone] Office phone:  [phone]

## 2013-12-21 DIAGNOSIS — S0230XA Fracture of orbital floor, unspecified side, initial encounter for closed fracture: Secondary | ICD-10-CM | POA: Diagnosis not present

## 2013-12-21 MED ORDER — HYDROCODONE-ACETAMINOPHEN 5-325 MG PO TABS: 1.0000 | ORAL_TABLET | Freq: Four times a day (QID) | ORAL | Status: DC | PRN

## 2013-12-21 NOTE — Evaluation (Addendum)
Speech Language Pathology Evaluation Patient Details Name: Mortimer FriesMargaret A Colonna MRN: [id number] DOB: [birth year] Today's Date: 12/21/2013 Time: 1045-1100 SLP Time Calculation (min): 15 min  Problem List:  Patient Active Problem List   Diagnosis Date Noted  . Closed fracture of facial bones 12/19/2013  . Head injury, closed, with concussion 12/19/2013  . Concussion with brief LOC 12/19/2013   Past Medical History:  Past Medical History  Diagnosis Date  . Hyperlipidemia   . Hypothyroidism   . Anemia   . Arthritis     "knees, fingers" (12/19/2013)  . Accidental fall 12/19/2013    "tripped over pallet in store; briefly lost consciousness" (12/20/2013)  . Stroke 12/2011    residual "left sided weakness" (12/19/2013)   Past Surgical History:  Past Surgical History  Procedure Laterality Date  . No past surgeries     HPI:  Patient is a 68 year old female with a history of stroke with mild residual left-sided weakness, hyperlipidemia, hypothyroidism who presented to the [facility] emergency department after she had a mechanical fall today. Patient reports she was at [employer] with her daughter when she turned around and tripped over something on the floor. She landed on her left side and struck her face. She feels she did lose consciousness for several seconds. Cognitive Linguistic Evaluation indicated s/p fall.  Assessment / Plan / Recommendation Clinical Impression  Cognitive Linguistic Evaluation completed. Minimal cognitive deficits in areas of alternating and divided attention.  Problem solving and safety awareness for basic tasks functional.  Receptive and Expressive Language skills baseline.  No further Skilled ST warranted at this time as cognition judged to be baseline and patient will receive necessary supervision with complex ADL's from family members upon discharge.  ST to sign off as education complete.     SLP Assessment  Patient does not need any further Speech Lanaguage Pathology Services     Follow Up Recommendations  None        SLP Evaluation Prior Functioning  Cognitive/Linguistic Baseline: Within functional limits Type of Home: Apartment  Lives With: Alone Available Help at Discharge: Family Vocation: Retired   IT consultantCognition  Overall Cognitive Status: Within Functional Limits for tasks assessed Arousal/Alertness: Awake/alert Orientation Level: Oriented X4 Memory: Appears intact Awareness: Appears intact Problem Solving: Appears intact    Comprehension  Auditory Comprehension Overall Auditory Comprehension: Appears within functional limits for tasks assessed Visual Recognition/Discrimination Discrimination: Within Function Limits Reading Comprehension Reading Status: Within funtional limits    Expression Expression Primary Mode of Expression: Verbal Verbal Expression Overall Verbal Expression: Appears within functional limits for tasks assessed   Oral / Motor Oral Motor/Sensory Function Overall Oral Motor/Sensory Function: Appears within functional limits for tasks assessed   GO    Moreen FowlerKaren Remigio Mcmillon MS, CCC-SLP [phone] [facility]ANKOF,Luccia Reinheimer 12/21/2013, 11:53 AM

## 2013-12-21 NOTE — Evaluation (Signed)
Physical Therapy Evaluation Patient Details Name: Alexis Ross MRN: [id number] DOB: 02/11/1946 Today's Date: 12/21/2013 Time: [id number] PT Time Calculation (min): 14 min  PT Assessment / Plan / Recommendation History of Present Illness  Patient is a 68 yo female admitted following fall with Lt facial fx's and Lt knee soft tissue injury.  Clinical Impression  Patient independent with mobility and gait.  No acute PT needs identified.  Encouraged patient to use her RW when Lt knee pain increases.  PT will sign off.  Patient safe for d/c home from PT perspective.    PT Assessment  Patent does not need any further PT services    Follow Up Recommendations  No PT follow up;Supervision - Intermittent    Does the patient have the potential to tolerate intense rehabilitation      Barriers to Discharge        Equipment Recommendations  None recommended by PT    Recommendations for Other Services     Frequency      Precautions / Restrictions Precautions Precautions: None Restrictions Weight Bearing Restrictions: No   Pertinent Vitals/Pain       Mobility  Bed Mobility Overal bed mobility: Independent Transfers Overall transfer level: Independent Equipment used: None Ambulation/Gait Ambulation/Gait assistance: Independent Ambulation Distance (Feet): 120 Feet Assistive device: None Gait Pattern/deviations: Step-through pattern;Decreased stance time - left;Decreased step length - right;Antalgic Gait velocity: WFL General Gait Details: Initially, patient with antalgic gait LLE.  This improved to more normal gait pattern with time.        PT Goals(Current goals can be found in the care plan section)    Visit Information  Last PT Received On: 12/21/13 Assistance Needed: +1 History of Present Illness: Patient is a 68 yo female admitted following fall with Lt facial fx's and Lt knee soft tissue injury.       Prior Functioning  Home Living Family/patient expects to  be discharged to:: Private residence Living Arrangements: Alone Available Help at Discharge: Family;Available PRN/intermittently (Daughter) Type of Home: Apartment Home Access: Level entry Home Layout: One level Home Equipment: Walker - 2 wheels;Shower seat Prior Function Level of Independence: Independent Communication Communication: No difficulties    Cognition  Cognition Arousal/Alertness: Awake/alert Behavior During Therapy: WFL for tasks assessed/performed Overall Cognitive Status: Within Functional Limits for tasks assessed    Extremity/Trunk Assessment Upper Extremity Assessment Upper Extremity Assessment: Overall WFL for tasks assessed Lower Extremity Assessment Lower Extremity Assessment: LLE deficits/detail LLE Deficits / Details: Patient reports intermittent pain with Lt knee, limiting strength/ROM   Balance    End of Session PT - End of Session Equipment Utilized During Treatment: Gait belt Activity Tolerance: Patient tolerated treatment well;Patient limited by pain Patient left: in bed;with call bell/phone within reach;with family/visitor present Nurse Communication: Mobility status  GP     Vena AustriaDavis, Heston Widener H 12/21/2013, 11:39 AM Durenda HurtSusan H. Renaldo Fiddleravis, PT, [facility] Acute Rehab Services Pager [phone]

## 2013-12-21 NOTE — Progress Notes (Signed)
See Megan Dort's discharge note.  Ovidio Kinavid Wannetta Langland, MD, [facility] LPFACS [facility] Pager: [phone] Office phone:  [phone]

## 2013-12-21 NOTE — Progress Notes (Signed)
LOS: 2 days   Subjective: Pt feels good, pain better controlled. No trouble with vision.  ENT Dr. Suszanne Connerseoh saw the patient and recommends OP follow up.  Patient wants to go home and thinks she can go home by herself.  Only walked in room so far.  Pending TBI team evaluation.  No other complaints.  Objective: Vital signs in last 24 hours: Temp:  [97.7 F (36.5 C)-98.4 F (36.9 C)] 97.7 F (36.5 C) (03/08 0542) Pulse Rate:  [68-86] 68 (03/08 0542) Resp:  [18-20] 20 (03/08 0542) BP: (114-137)/(51-64) 114/61 mmHg (03/08 0542) SpO2:  [96 %-100 %] 97 % (03/08 0542) Last BM Date: 12/19/13  Lab Results:  CBC  Recent Labs  12/19/13 1744 12/20/13 0410  WBC 10.2 7.4  HGB 12.7 11.1*  HCT 39.1 34.2*  PLT 272 227   BMET  Recent Labs  12/19/13 1744 12/20/13 0410  NA 144 143  K 4.0 4.3  CL 105 108  CO2 27 24  GLUCOSE 122* 88  BUN 15 12  CREATININE 0.94 0.77  CALCIUM 10.1 9.4    Imaging: Ct Head Wo Contrast  12/19/2013   CLINICAL DATA:  FALL  EXAM: CT HEAD WITHOUT CONTRAST  CT MAXILLOFACIAL WITHOUT CONTRAST  CT CERVICAL SPINE WITHOUT CONTRAST  TECHNIQUE: Multidetector CT imaging of the head, cervical spine, and maxillofacial structures were performed using the standard protocol without intravenous contrast. Multiplanar CT image reconstructions of the cervical spine and maxillofacial structures were also generated.  COMPARISON:  CT MAXILLOFACIAL W/O CM SINUS dated 12/19/2013  FINDINGS: CT HEAD FINDINGS  No acute intracranial abnormality. Specifically, no hemorrhage, hydrocephalus, mass lesion, acute infarction, or significant intracranial injury. Multiple left orbital fractures are partially visualized on this study and will be further characterized on dedicated maxillofacial CT. There are areas of subcutaneous emphysema as well as emphysema within the masticator space. Periorbital emphysema and soft tissue swelling is partially visualized on the left. The mastoid air cells are patent. There  is no evidence of a depressed calvarial fracture.  CT MAXILLOFACIAL FINDINGS  Evaluation of the left orbit demonstrates periorbital emphysema extending from the preseptal region to the soft tissues overlying the zygoma. There is diffuse periorbital soft tissue swelling. 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 There are areas of intermediate attenuation within the preseptal region likely representing component of hematoma and inflammatory changes. The post septal region appears intact. The globe demonstrates homogeneous attenuation and appears intact. The optic nerve and extraocular musculature appears intact.  Orbital and maxillary sinus fractures are appreciated on the left: A comminuted inferiorly displaced fracture is identified within the floor of the orbit. A mildly depressed fracture is identified along the lateral wall of the orbit. A comminuted fracture with depression is appreciated within the lateral aspect of the maxillary sinus. A mildly depressed fracture is appreciated within the posterior wall of the maxillary sinus. A nondisplaced fracture is appreciated within the medial wall of the maxillary sinus. There is a diffuse high attenuating fluid within the maxillary sinus consistent with hemorrhage.  The temporomandibular joints are intact as well as the mandible. The remaining paranasal sinuses appear aerated as well as the mastoid air cells.  A mildly depressed fracture is identified along the mid aspect of the nasal bone on the left.  CT CERVICAL SPINE FINDINGS  There is mild reversal of the normal cervical lordosis which may represent muscle spasm, collar placement, or  positioning. There is no evidence of acute fracture or dislocation. There is no evidence of canal stenosis. Disc space narrowing is appreciated at the C5-6 level, mild. There is mild endplate sclerosis and peripheral  hypertrophic spurring. The soft tissues of the neck are unremarkable. Mild apical scarring is identified within the left lung apex.  IMPRESSION: 1. Multiple orbital and maxillary sinus fractures on the left. 2. No evidence of acute intracranial abnormalities 3. Mild reversal of the normal cervical lordosis likely secondary to collar placement, muscle spasm, and/or positioning. 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 No evidence of acute osseous abnormalities within the cervical spine.   Electronically Signed   By: Salome Holmes M.D.   On: 12/19/2013 16:49   Ct Cervical Spine Wo Contrast  12/19/2013   CLINICAL DATA:  FALL  EXAM: CT HEAD WITHOUT CONTRAST  CT MAXILLOFACIAL WITHOUT CONTRAST  CT CERVICAL SPINE WITHOUT CONTRAST  TECHNIQUE: Multidetector CT imaging of the head, cervical spine, and maxillofacial structures were performed using the standard protocol without intravenous contrast. Multiplanar CT image reconstructions of the cervical spine and maxillofacial structures were also generated.  COMPARISON:  CT MAXILLOFACIAL W/O CM SINUS dated 12/19/2013  FINDINGS: CT HEAD FINDINGS  No acute intracranial abnormality. Specifically, no hemorrhage, hydrocephalus, mass lesion, acute infarction, or significant intracranial injury. Multiple left orbital fractures are partially visualized on this study and will be further characterized on dedicated maxillofacial CT. There are areas of subcutaneous emphysema as well as emphysema within the masticator space. Periorbital emphysema and soft tissue swelling is partially visualized on the left. The mastoid air cells are patent. There is no evidence of a depressed calvarial fracture.  CT MAXILLOFACIAL FINDINGS  Evaluation of the left orbit demonstrates periorbital emphysema extending from the preseptal region to the soft tissues overlying the zygoma. There is diffuse periorbital soft tissue swelling. Emphysematous changes are appreciated within the masticator space as well as  areas within the base of the orbit projecting in an extraconal location. There are areas of intermediate attenuation within the preseptal region likely representing component of hematoma and inflammatory changes. The post septal region appears intact. The globe demonstrates homogeneous attenuation and appears intact. The optic nerve and extraocular musculature appears intact.  Orbital and maxillary sinus fractures are appreciated on the left: A comminuted inferiorly displaced fracture is identified within the floor of the orbit. A mildly depressed fracture is identified along the lateral wall of the orbit. A comminuted fracture with depression is appreciated within the lateral aspect of the maxillary sinus. A mildly depressed fracture is appreciated within the posterior wall of the maxillary sinus. A nondisplaced fracture is appreciated within the medial wall of the maxillary sinus. There is a diffuse high attenuating fluid within the maxillary sinus consistent with hemorrhage.  The temporomandibular joints are intact as well as the mandible. The remaining paranasal sinuses appear aerated as well as the mastoid air cells.  A mildly depressed fracture is identified along the mid aspect of the nasal bone on the left.  CT CERVICAL SPINE FINDINGS  There is mild reversal of the normal cervical lordosis which may represent muscle spasm, collar placement, or positioning. There is no evidence of acute fracture or dislocation. There is no evidence of canal stenosis. Disc space narrowing is appreciated at the C5-6 level, mild. There is mild endplate sclerosis and peripheral hypertrophic spurring. The soft tissues of the neck are unremarkable. Mild apical scarring is identified within the left lung apex.  IMPRESSION: 1. Multiple orbital and maxillary sinus fractures  on the left. 2. No evidence of acute intracranial abnormalities 3. Mild reversal of the normal cervical lordosis likely secondary to collar placement, muscle  spasm, and/or positioning. 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 No evidence of acute osseous abnormalities within the cervical spine.   Electronically Signed   By: Salome Holmes M.D.   On: 12/19/2013 16:49   Dg Chest Port 1 View  12/19/2013   CLINICAL DATA:  Fall.  Loss of consciousness.  EXAM: PORTABLE CHEST - 1 VIEW  COMPARISON:  DG RIBS UNILATERAL W/CHEST*R* dated 05/08/2008  FINDINGS: The heart size and mediastinal contours are within normal limits. Both lungs are clear. The visualized skeletal structures are unremarkable.  IMPRESSION: No active disease.   Electronically Signed   By: Charlett Nose M.D.   On: 12/19/2013 20:43   Dg Knee Complete 4 Views Left  12/19/2013   CLINICAL DATA:  Left knee pain after fall.  EXAM: LEFT KNEE - COMPLETE 4+ VIEW  COMPARISON:  None.  FINDINGS: There is no evidence of fracture, dislocation, or joint effusion. There is no evidence of arthropathy or other focal bone abnormality. Soft tissues are unremarkable.  IMPRESSION: Normal left knee.   Electronically Signed   By: Roque Lias M.D.   On: 12/19/2013 15:57   Ct Maxillofacial Wo Cm  12/19/2013   CLINICAL DATA:  FALL  EXAM: CT HEAD WITHOUT CONTRAST  CT MAXILLOFACIAL WITHOUT CONTRAST  CT CERVICAL SPINE WITHOUT CONTRAST  TECHNIQUE: Multidetector CT imaging of the head, cervical spine, and maxillofacial structures were performed using the standard protocol without intravenous contrast. Multiplanar CT image reconstructions of the cervical spine and maxillofacial structures were also generated.  COMPARISON:  CT MAXILLOFACIAL W/O CM SINUS dated 12/19/2013  FINDINGS: CT HEAD FINDINGS  No acute intracranial abnormality. Specifically, no hemorrhage, hydrocephalus, mass lesion, acute infarction, or significant intracranial injury. Multiple left orbital fractures are partially visualized on this study and will be further characterized on dedicated maxillofacial CT. There are areas of subcutaneous emphysema as well as emphysema  within the masticator space. Periorbital emphysema and soft tissue swelling is partially visualized on the left. The mastoid air cells are patent. There is no evidence of a depressed calvarial fracture.  CT MAXILLOFACIAL FINDINGS  Evaluation of the left orbit demonstrates periorbital emphysema extending from the preseptal region to the soft tissues overlying the zygoma. There is diffuse periorbital soft tissue swelling. 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 There are areas of intermediate attenuation within the preseptal region likely representing component of hematoma and inflammatory changes. The post septal region appears intact. The globe demonstrates homogeneous attenuation and appears intact. The optic nerve and extraocular musculature appears intact.  Orbital and maxillary sinus fractures are appreciated on the left: A comminuted inferiorly displaced fracture is identified within the floor of the orbit. A mildly depressed fracture is identified along the lateral wall of the orbit. A comminuted fracture with depression is appreciated within the lateral aspect of the maxillary sinus. A mildly depressed fracture is appreciated within the posterior wall of the maxillary sinus. A nondisplaced fracture is appreciated within the medial wall of the maxillary sinus. There is a diffuse high attenuating fluid within the maxillary sinus consistent with hemorrhage.  The temporomandibular joints are intact as well as the mandible. The remaining paranasal sinuses appear aerated as well as the mastoid air cells.  A mildly depressed fracture is identified along the mid  aspect of the nasal bone on the left.  CT CERVICAL SPINE FINDINGS  There is mild reversal of the normal cervical lordosis which may represent muscle spasm, collar placement, or positioning. There is no evidence of acute fracture or dislocation. There  is no evidence of canal stenosis. Disc space narrowing is appreciated at the C5-6 level, mild. There is mild endplate sclerosis and peripheral hypertrophic spurring. The soft tissues of the neck are unremarkable. Mild apical scarring is identified within the left lung apex.  IMPRESSION: 1. Multiple orbital and maxillary sinus fractures on the left. 2. No evidence of acute intracranial abnormalities 3. Mild reversal of the normal cervical lordosis likely secondary to collar placement, muscle spasm, and/or positioning. 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 No evidence of acute osseous abnormalities within the cervical spine.   Electronically Signed   By: Salome Holmes M.D.   On: 12/19/2013 16:49    PE: General: pleasant, WD/WN AA female who is laying in bed in NAD HEENT: head is normocephalic.  Periorbital ecchymosis and edema to left eye, EOM's intact b/l.  Tenderness to left face.  She had significant improving in her swelling while in the hospital.  Sclera are noninjected.  PERRL.  Ears and nose without any masses or lesions.  Mouth is pink and moist Heart: regular, rate, and rhythm.  Normal s1,s2. No obvious murmurs, gallops, or rubs noted.  Palpable radial and pedal pulses bilaterally Lungs: CTAB, no wheezes, rhonchi, or rales noted.  Respiratory effort nonlabored Abd: soft, NT/ND, +BS, no masses, hernias, or organomegaly MS: all 4 extremities are symmetrical with no cyanosis, clubbing, or edema, left knee edema improved and less tender Skin: warm and dry with no masses, lesions, or rashes   Assessment/Plan: S/p Fall with very brief LOC  Closed head injury/concussion  Multiple left sided facial fractures - OP f/u with Dr. Suszanne Conners  L knee pain/bruise - ice/NSAIDS, PCP follow up if needed Periorbital bruising  ABL anemia - mild  VTE - SCD's, Lovenox  FEN - tolerating reg diet Dispo -- PT/OT/SLP eval, d/c today if she doesn't need supervision.  She lives alone and children work.  Aris Georgia, PA-C Pager: [phone] General Trauma PA Pager: [phone]   12/21/2013  Agree with above.  Ovidio Kin, MD, [facility] Pager: [phone] Office phone:  [phone]

## 2013-12-21 NOTE — Discharge Summary (Signed)
Physician Discharge Summary  Patient ID: Alexis Ross MRN: [id number] DOB/AGE: 1946-08-09 68 y.o.  Admit date: 12/19/2013 Discharge date: 12/21/2013  Discharge Diagnoses Patient Active Problem List   Diagnosis Date Noted  . Closed fracture of facial bones 12/19/2013  . Head injury, closed, with concussion 12/19/2013  . Concussion with brief LOC 12/19/2013   Consultants Dr. Suszanne Ross (ENT)  Procedures None  Hospital Course:  68 year old female with a history of stroke with mild residual left-sided weakness, hyperlipidemia, hypothyroidism who presented to the [facility] emergency department after she had a mechanical fall today. Patient reports she was at [facility] with her daughter when she turned around and tripped over something on the floor. She landed on her left side and struck her face. She feels she did lose consciousness for several seconds. She is having left-sided facial pain and left knee pain. She is on Plavix.  No new numbness, tingling or weakness. No chest pain shortness of breath. No abdominal pain. She is complaining of left facial pain and left knee pain.  Pt family requested transfer to [facility] for evaluation and did not feel comfortable taking the patient home given she was asking repetitive questions at times.   Workup showed s/p fall with brief LOC, closed head injury/concussion, multiple left sided facial fractures, left knee contusion, and periorbital bruising.  Patient was admitted was transferred to the floor for observation.  Dr. Suszanne Ross from ENT saw the patient and recommended OP follow up in the office in 1 week and said she would not likely need OR.   Diet was advanced as tolerated.  She was seen by PT/OT/SLP as part of the TBI team.  They recommended some supervision at discharge.  On HD #3, the patient was voiding well, tolerating diet, ambulating well, pain well controlled, vital signs stable, incisions c/d/i and felt stable for discharge home.  Patient will follow  up in our office as needed and knows to call with questions or concerns.  She should make an appointment with her PCP. She will follow up with Dr. Suszanne Ross for her face.     Medication List         acetaminophen 500 MG tablet  Commonly known as:  TYLENOL  Take 1,000 mg by mouth every 6 (six) hours as needed. For pain     aspirin EC 81 MG tablet  Take 81 mg by mouth daily.     clopidogrel 75 MG tablet  Commonly known as:  PLAVIX  Take 75 mg by mouth daily with breakfast.     diphenhydrAMINE 25 MG tablet  Commonly known as:  BENADRYL  Take 25 mg by mouth every 6 (six) hours as needed. For allergies     docusate sodium 100 MG capsule  Commonly known as:  COLACE  Take 1 capsule (100 mg total) by mouth every 12 (twelve) hours.     HYDROcodone-acetaminophen 5-325 MG per tablet  Commonly known as:  NORCO/VICODIN  Take 1-2 tablets by mouth every 6 (six) hours as needed for severe pain.     levothyroxine 50 MCG tablet  Commonly known as:  SYNTHROID, LEVOTHROID  Take 50 mcg by mouth daily before breakfast.     levothyroxine 50 MCG tablet  Commonly known as:  SYNTHROID, LEVOTHROID  Take 1 tablet (50 mcg total) by mouth daily before breakfast.     multivitamin with minerals Tabs tablet  Take 1 tablet by mouth daily.     naproxen sodium 220 MG tablet  Commonly known  as:  ANAPROX  Take 220 mg by mouth every 8 (eight) hours as needed. Arthritis pain     oxyCODONE-acetaminophen 5-325 MG per tablet  Commonly known as:  PERCOCET/ROXICET  Take 1-2 tablets by mouth every 4 (four) hours as needed.     pravastatin 80 MG tablet  Commonly known as:  PRAVACHOL  Take 80 mg by mouth daily.            Follow-up Information   Schedule an appointment as soon as possible for a visit with Alexis MollEOH,SUI W, MD.   Specialty:  Otolaryngology   Contact information:   [address] [place] [address] [phone]       Signed: Rueben BashMegan N. Ross, [facility]  Trauma  Service [phone]  12/21/2013, 11:04 AM  Agree with above.  Alexis Kinavid Chancy Claros, MD, [facility] Pager: [phone] Office phone:  [phone]

## 2013-12-21 NOTE — Consult Note (Signed)
Reason for Consult: Facial fractures Referring Physician: Greer Pickerel, MD  HPI:  Alexis Ross is an 68 y.o. female who was transferred from [facility] after a fall.  Patient reports she was at [employer] with her daughter when she turned around and tripped over something on the floor. She landed on her left side and struck her face. Brief lose consciousness for several seconds. She is having left-sided facial pain and left knee pain. Her facial CT shows fractures of the left orbital floor and lateral wall, and left maxillary walls. No loss of vision.   Past Medical History  Diagnosis Date  . Hyperlipidemia   . Hypothyroidism   . Anemia   . Arthritis     "knees, fingers" (12/19/2013)  . Accidental fall 12/19/2013    "tripped over pallet in store; briefly lost consciousness" (12/20/2013)  . Stroke 12/2011    residual "left sided weakness" (12/19/2013)    Past Surgical History  Procedure Laterality Date  . No past surgeries      History reviewed. No pertinent family history.  Social History:  reports that she has quit smoking. Her smoking use included Cigarettes. She has a 15 pack-year smoking history. She has never used smokeless tobacco. She reports that she drinks alcohol. She reports that she does not use illicit drugs.  Allergies: No Known Allergies  Medications:  I have reviewed the patient's current medications. Scheduled: . docusate sodium  100 mg Oral BID  . enoxaparin (LOVENOX) injection  40 mg Subcutaneous Q24H  . levothyroxine  50 mcg Oral QAC breakfast  . pantoprazole  40 mg Oral Daily   IHW:[id number], HYDROcodone-acetaminophen, morphine injection, ondansetron (ZOFRAN) IV, ondansetron, oxyCODONE  Results for orders placed during the hospital encounter of 12/19/13 (from the past 48 hour(s))  CBC WITH DIFFERENTIAL     Status: None   Collection Time    12/19/13  5:44 PM      Result Value Ref Range   WBC 10.2  4.0 - 10.5 K/uL   RBC 4.24  3.87 - 5.11 MIL/uL    Hemoglobin 12.7  12.0 - 15.0 g/dL   HCT 39.1  36.0 - 46.0 %   MCV 92.2  78.0 - 100.0 fL   MCH 30.0  26.0 - 34.0 pg   MCHC 32.5  30.0 - 36.0 g/dL   RDW 14.9  11.5 - 15.5 %   Platelets 272  150 - 400 K/uL   Neutrophils Relative % 72  43 - 77 %   Neutro Abs 7.3  1.7 - 7.7 K/uL   Lymphocytes Relative 20  12 - 46 %   Lymphs Abs 2.1  0.7 - 4.0 K/uL   Monocytes Relative 7  3 - 12 %   Monocytes Absolute 0.7  0.1 - 1.0 K/uL   Eosinophils Relative 1  0 - 5 %   Eosinophils Absolute 0.1  0.0 - 0.7 K/uL   Basophils Relative 0  0 - 1 %   Basophils Absolute 0.0  0.0 - 0.1 K/uL  COMPREHENSIVE METABOLIC PANEL     Status: Abnormal   Collection Time    12/19/13  5:44 PM      Result Value Ref Range   Sodium 144  137 - 147 mEq/L   Potassium 4.0  3.7 - 5.3 mEq/L   Chloride 105  96 - 112 mEq/L   CO2 27  19 - 32 mEq/L   Glucose, Bld 122 (*) 70 - 99 mg/dL   BUN 15  6 -  23 mg/dL   Creatinine, Ser 0.94  0.50 - 1.10 mg/dL   Calcium 10.1  8.4 - 10.5 mg/dL   Total Protein 7.9  6.0 - 8.3 g/dL   Albumin 4.2  3.5 - 5.2 g/dL   AST 20  0 - 37 U/L   ALT 12  0 - 35 U/L   Alkaline Phosphatase 109  39 - 117 U/L   Total Bilirubin 0.1 (*) 0.3 - 1.2 mg/dL   GFR calc non Af Amer 61 (*) >90 mL/min   GFR calc Af Amer 70 (*) >90 mL/min   Comment: (NOTE)     The eGFR has been calculated using the CKD EPI equation.     This calculation has not been validated in all clinical situations.     eGFR's persistently <90 mL/min signify possible Chronic Kidney     Disease.  APTT     Status: None   Collection Time    12/19/13  5:44 PM      Result Value Ref Range   aPTT 27  24 - 37 seconds  PROTIME-INR     Status: None   Collection Time    12/19/13  5:44 PM      Result Value Ref Range   Prothrombin Time 12.4  11.6 - 15.2 seconds   INR 0.94  0.00 - 1.49  SAMPLE TO BLOOD BANK     Status: None   Collection Time    12/19/13  5:48 PM      Result Value Ref Range   Blood Bank Specimen [id number]     Sample Expiration 12/20/2013     URINALYSIS, ROUTINE W REFLEX MICROSCOPIC     Status: None   Collection Time    12/19/13  5:50 PM      Result Value Ref Range   Color, Urine YELLOW  YELLOW   APPearance CLEAR  CLEAR   Specific Gravity, Urine 1.010  1.005 - 1.030   pH 7.0  5.0 - 8.0   Glucose, UA NEGATIVE  NEGATIVE mg/dL   Hgb urine dipstick NEGATIVE  NEGATIVE   Bilirubin Urine NEGATIVE  NEGATIVE   Ketones, ur NEGATIVE  NEGATIVE mg/dL   Protein, ur NEGATIVE  NEGATIVE mg/dL   Urobilinogen, UA 0.2  0.0 - 1.0 mg/dL   Nitrite NEGATIVE  NEGATIVE   Leukocytes, UA NEGATIVE  NEGATIVE   Comment: MICROSCOPIC NOT DONE ON URINES WITH NEGATIVE PROTEIN, BLOOD, LEUKOCYTES, NITRITE, OR GLUCOSE <1000 mg/dL.  CBC     Status: Abnormal   Collection Time    12/20/13  4:10 AM      Result Value Ref Range   WBC 7.4  4.0 - 10.5 K/uL   RBC 3.72 (*) 3.87 - 5.11 MIL/uL   Hemoglobin 11.1 (*) 12.0 - 15.0 g/dL   HCT 34.2 (*) 36.0 - 46.0 %   MCV 91.9  78.0 - 100.0 fL   MCH 29.8  26.0 - 34.0 pg   MCHC 32.5  30.0 - 36.0 g/dL   RDW 14.9  11.5 - 15.5 %   Platelets 227  150 - 400 K/uL  BASIC METABOLIC PANEL     Status: Abnormal   Collection Time    12/20/13  4:10 AM      Result Value Ref Range   Sodium 143  137 - 147 mEq/L   Potassium 4.3  3.7 - 5.3 mEq/L   Chloride 108  96 - 112 mEq/L   CO2 24  19 - 32 mEq/L   Glucose, Bld  88  70 - 99 mg/dL   BUN 12  6 - 23 mg/dL   Creatinine, Ser 0.77  0.50 - 1.10 mg/dL   Calcium 9.4  8.4 - 10.5 mg/dL   GFR calc non Af Amer 84 (*) >90 mL/min   GFR calc Af Amer >90  >90 mL/min   Comment: (NOTE)     The eGFR has been calculated using the CKD EPI equation.     This calculation has not been validated in all clinical situations.     eGFR's persistently <90 mL/min signify possible Chronic Kidney     Disease.    Ct Head Wo Contrast  12/19/2013   CLINICAL DATA:  FALL  EXAM: CT HEAD WITHOUT CONTRAST  CT MAXILLOFACIAL WITHOUT CONTRAST  CT CERVICAL SPINE WITHOUT CONTRAST  TECHNIQUE: Multidetector CT imaging of  the head, cervical spine, and maxillofacial structures were performed using the standard protocol without intravenous contrast. Multiplanar CT image reconstructions of the cervical spine and maxillofacial structures were also generated.  COMPARISON:  CT MAXILLOFACIAL W/O CM SINUS dated 12/19/2013  FINDINGS: CT HEAD FINDINGS  No acute intracranial abnormality. Specifically, no hemorrhage, hydrocephalus, mass lesion, acute infarction, or significant intracranial injury. Multiple left orbital fractures are partially visualized on this study and will be further characterized on dedicated maxillofacial CT. There are areas of subcutaneous emphysema as well as emphysema within the masticator space. Periorbital emphysema and soft tissue swelling is partially visualized on the left. The mastoid air cells are patent. There is no evidence of a depressed calvarial fracture.  CT MAXILLOFACIAL FINDINGS  Evaluation of the left orbit demonstrates periorbital emphysema extending from the preseptal region to the soft tissues overlying the zygoma. There is diffuse periorbital soft tissue swelling. 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 There are areas of intermediate attenuation within the preseptal region likely representing component of hematoma and inflammatory changes. The post septal region appears intact. The globe demonstrates homogeneous attenuation and appears intact. The optic nerve and extraocular musculature appears intact.  Orbital and maxillary sinus fractures are appreciated on the left: A comminuted inferiorly displaced fracture is identified within the floor of the orbit. A mildly depressed fracture is identified along the lateral wall of the orbit. A comminuted fracture with depression is appreciated within the lateral aspect of the maxillary sinus. A mildly depressed fracture is appreciated within the  posterior wall of the maxillary sinus. A nondisplaced fracture is appreciated within the medial wall of the maxillary sinus. There is a diffuse high attenuating fluid within the maxillary sinus consistent with hemorrhage.  The temporomandibular joints are intact as well as the mandible. The remaining paranasal sinuses appear aerated as well as the mastoid air cells.  A mildly depressed fracture is identified along the mid aspect of the nasal bone on the left.  CT CERVICAL SPINE FINDINGS  There is mild reversal of the normal cervical lordosis which may represent muscle spasm, collar placement, or positioning. There is no evidence of acute fracture or dislocation. There is no evidence of canal stenosis. Disc space narrowing is appreciated at the C5-6 level, mild. There is mild endplate sclerosis and peripheral hypertrophic spurring. The soft tissues of the neck are unremarkable. Mild apical scarring is identified within the left lung apex.  IMPRESSION: 1. Multiple orbital and maxillary sinus fractures on the left. 2. No evidence of acute intracranial abnormalities 3. Mild reversal of the normal cervical lordosis  likely secondary to collar placement, muscle spasm, and/or positioning. 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 No evidence of acute osseous abnormalities within the cervical spine.   Electronically Signed   By: Margaree Mackintosh M.D.   On: 12/19/2013 16:49   Ct Cervical Spine Wo Contrast  12/19/2013   CLINICAL DATA:  FALL  EXAM: CT HEAD WITHOUT CONTRAST  CT MAXILLOFACIAL WITHOUT CONTRAST  CT CERVICAL SPINE WITHOUT CONTRAST  TECHNIQUE: Multidetector CT imaging of the head, cervical spine, and maxillofacial structures were performed using the standard protocol without intravenous contrast. Multiplanar CT image reconstructions of the cervical spine and maxillofacial structures were also generated.  COMPARISON:  CT MAXILLOFACIAL W/O CM SINUS dated 12/19/2013  FINDINGS: CT HEAD FINDINGS  No acute intracranial  abnormality. Specifically, no hemorrhage, hydrocephalus, mass lesion, acute infarction, or significant intracranial injury. Multiple left orbital fractures are partially visualized on this study and will be further characterized on dedicated maxillofacial CT. There are areas of subcutaneous emphysema as well as emphysema within the masticator space. Periorbital emphysema and soft tissue swelling is partially visualized on the left. The mastoid air cells are patent. There is no evidence of a depressed calvarial fracture.  CT MAXILLOFACIAL FINDINGS  Evaluation of the left orbit demonstrates periorbital emphysema extending from the preseptal region to the soft tissues overlying the zygoma. There is diffuse periorbital soft tissue swelling. 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 There are areas of intermediate attenuation within the preseptal region likely representing component of hematoma and inflammatory changes. The post septal region appears intact. The globe demonstrates homogeneous attenuation and appears intact. The optic nerve and extraocular musculature appears intact.  Orbital and maxillary sinus fractures are appreciated on the left: A comminuted inferiorly displaced fracture is identified within the floor of the orbit. A mildly depressed fracture is identified along the lateral wall of the orbit. A comminuted fracture with depression is appreciated within the lateral aspect of the maxillary sinus. A mildly depressed fracture is appreciated within the posterior wall of the maxillary sinus. A nondisplaced fracture is appreciated within the medial wall of the maxillary sinus. There is a diffuse high attenuating fluid within the maxillary sinus consistent with hemorrhage.  The temporomandibular joints are intact as well as the mandible. The remaining paranasal sinuses appear aerated as well as the  mastoid air cells.  A mildly depressed fracture is identified along the mid aspect of the nasal bone on the left.  CT CERVICAL SPINE FINDINGS  There is mild reversal of the normal cervical lordosis which may represent muscle spasm, collar placement, or positioning. There is no evidence of acute fracture or dislocation. There is no evidence of canal stenosis. Disc space narrowing is appreciated at the C5-6 level, mild. There is mild endplate sclerosis and peripheral hypertrophic spurring. The soft tissues of the neck are unremarkable. Mild apical scarring is identified within the left lung apex.  IMPRESSION: 1. Multiple orbital and maxillary sinus fractures on the left. 2. No evidence of acute intracranial abnormalities 3. Mild reversal of the normal cervical lordosis likely secondary to collar placement, muscle spasm, and/or positioning. 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 No evidence of acute osseous abnormalities within the cervical spine.   Electronically Signed   By: Margaree Mackintosh M.D.   On: 12/19/2013 16:49   Dg Chest Port 1 View  12/19/2013   CLINICAL DATA:  Fall.  Loss of consciousness.  EXAM: PORTABLE  CHEST - 1 VIEW  COMPARISON:  DG RIBS UNILATERAL W/CHEST*R* dated 05/08/2008  FINDINGS: The heart size and mediastinal contours are within normal limits. Both lungs are clear. The visualized skeletal structures are unremarkable.  IMPRESSION: No active disease.   Electronically Signed   By: Rolm Baptise M.D.   On: 12/19/2013 20:43   Dg Knee Complete 4 Views Left  12/19/2013   CLINICAL DATA:  Left knee pain after fall.  EXAM: LEFT KNEE - COMPLETE 4+ VIEW  COMPARISON:  None.  FINDINGS: There is no evidence of fracture, dislocation, or joint effusion. There is no evidence of arthropathy or other focal bone abnormality. Soft tissues are unremarkable.  IMPRESSION: Normal left knee.   Electronically Signed   By: Sabino Dick M.D.   On: 12/19/2013 15:57   Ct Maxillofacial Wo Cm  12/19/2013   CLINICAL DATA:   FALL  EXAM: CT HEAD WITHOUT CONTRAST  CT MAXILLOFACIAL WITHOUT CONTRAST  CT CERVICAL SPINE WITHOUT CONTRAST  TECHNIQUE: Multidetector CT imaging of the head, cervical spine, and maxillofacial structures were performed using the standard protocol without intravenous contrast. Multiplanar CT image reconstructions of the cervical spine and maxillofacial structures were also generated.  COMPARISON:  CT MAXILLOFACIAL W/O CM SINUS dated 12/19/2013  FINDINGS: CT HEAD FINDINGS  No acute intracranial abnormality. Specifically, no hemorrhage, hydrocephalus, mass lesion, acute infarction, or significant intracranial injury. Multiple left orbital fractures are partially visualized on this study and will be further characterized on dedicated maxillofacial CT. There are areas of subcutaneous emphysema as well as emphysema within the masticator space. Periorbital emphysema and soft tissue swelling is partially visualized on the left. The mastoid air cells are patent. There is no evidence of a depressed calvarial fracture.  CT MAXILLOFACIAL FINDINGS  Evaluation of the left orbit demonstrates periorbital emphysema extending from the preseptal region to the soft tissues overlying the zygoma. There is diffuse periorbital soft tissue swelling. 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 There are areas of intermediate attenuation within the preseptal region likely representing component of hematoma and inflammatory changes. The post septal region appears intact. The globe demonstrates homogeneous attenuation and appears intact. The optic nerve and extraocular musculature appears intact.  Orbital and maxillary sinus fractures are appreciated on the left: A comminuted inferiorly displaced fracture is identified within the floor of the orbit. A mildly depressed fracture is identified along the lateral wall of the orbit. A comminuted  fracture with depression is appreciated within the lateral aspect of the maxillary sinus. A mildly depressed fracture is appreciated within the posterior wall of the maxillary sinus. A nondisplaced fracture is appreciated within the medial wall of the maxillary sinus. There is a diffuse high attenuating fluid within the maxillary sinus consistent with hemorrhage.  The temporomandibular joints are intact as well as the mandible. The remaining paranasal sinuses appear aerated as well as the mastoid air cells.  A mildly depressed fracture is identified along the mid aspect of the nasal bone on the left.  CT CERVICAL SPINE FINDINGS  There is mild reversal of the normal cervical lordosis which may represent muscle spasm, collar placement, or positioning. There is no evidence of acute fracture or dislocation. There is no evidence of canal stenosis. Disc space narrowing is appreciated at the C5-6 level, mild. There is mild endplate sclerosis and peripheral hypertrophic spurring. The soft tissues of the neck are unremarkable. Mild apical scarring is identified within  the left lung apex.  IMPRESSION: 1. Multiple orbital and maxillary sinus fractures on the left. 2. No evidence of acute intracranial abnormalities 3. Mild reversal of the normal cervical lordosis likely secondary to collar placement, muscle spasm, and/or positioning. 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 No evidence of acute osseous abnormalities within the cervical spine.   Electronically Signed   By: Margaree Mackintosh M.D.   On: 12/19/2013 16:49   ROS:  Constitutional: no fever  Eyes: no drainage  ENT: no runny nose  Cardiovascular: no chest pain  Resp: no SOB  GI: no vomiting  GU: no dysuria  Integumentary: no rash  Allergy: no hives  Musculoskeletal: no leg swelling  Neurological: no slurred speech  ROS otherwise negative  Blood pressure 114/61, pulse 68, temperature 97.7 F (36.5 C), temperature source Oral, resp. rate 20, height _0   (1.676 m), weight 185 lb 10 oz (84.2 kg), SpO2 97.00%.  Physical Exam: CONSTITUTIONAL: Alert and oriented and responds appropriately to questions. Well-appearing; well-nourished HEAD: Normocephalic; atraumatic  EYES: Conjunctivae clear, PERRL, EOMI; there is a mild amount of left periorbital swelling; no proptosis; patient reports slightly blurry vision with her left eye but Visual Acuity is normal  ENT: normal nose; no rhinorrhea; moist mucous membranes; pharynx without lesions noted; no dental injury; patient has poor dentition at baseline, no hemotypanum; no septal hematoma;  patient is tender to palpation diffusely over her left face without obvious bony deformity, midface is stable; No significant numbness. NECK: Supple, no meningismus, no LAD; no midline spinal tenderness, step-off or deformity  SKIN: Normal color for age and race; warm  NEURO: Moves all extremities equally, sensation to light touch intact diffusely, patient has mild left facial droop which is chronic  PSYCH: The patient's mood and manner are appropriate. Grooming and personal hygiene are appropriate.  Assessment/Plan: Left orbital and maxillary wall fractures.  No entrapment or diplopia noted today.  The swelling has decreased.   Pt may f/u with me as an outpatient approximately 1 week after discharge.  Likely will not need any surgical intervention  Aisling Emigh,SUI W 12/21/2013, 10:29 AM

## 2014-01-05 DIAGNOSIS — R51 Headache: Secondary | ICD-10-CM | POA: Diagnosis not present

## 2014-01-05 DIAGNOSIS — I10 Essential (primary) hypertension: Secondary | ICD-10-CM | POA: Diagnosis not present

## 2014-01-05 DIAGNOSIS — Z79899 Other long term (current) drug therapy: Secondary | ICD-10-CM | POA: Diagnosis not present

## 2014-01-05 DIAGNOSIS — M25569 Pain in unspecified knee: Secondary | ICD-10-CM | POA: Diagnosis not present

## 2014-01-14 DIAGNOSIS — E785 Hyperlipidemia, unspecified: Secondary | ICD-10-CM | POA: Diagnosis not present

## 2014-01-14 DIAGNOSIS — G459 Transient cerebral ischemic attack, unspecified: Secondary | ICD-10-CM | POA: Diagnosis not present

## 2014-01-14 DIAGNOSIS — M25569 Pain in unspecified knee: Secondary | ICD-10-CM | POA: Diagnosis not present

## 2014-01-14 DIAGNOSIS — E039 Hypothyroidism, unspecified: Secondary | ICD-10-CM | POA: Diagnosis not present

## 2014-01-15 ENCOUNTER — Ambulatory Visit (INDEPENDENT_AMBULATORY_CARE_PROVIDER_SITE_OTHER): Payer: Medicare Other | Admitting: Otolaryngology

## 2014-01-15 DIAGNOSIS — S0230XA Fracture of orbital floor, unspecified side, initial encounter for closed fracture: Secondary | ICD-10-CM

## 2014-04-02 DIAGNOSIS — R040 Epistaxis: Secondary | ICD-10-CM | POA: Diagnosis not present

## 2014-04-02 DIAGNOSIS — I10 Essential (primary) hypertension: Secondary | ICD-10-CM | POA: Diagnosis not present

## 2014-04-02 DIAGNOSIS — E785 Hyperlipidemia, unspecified: Secondary | ICD-10-CM | POA: Diagnosis not present

## 2014-04-02 DIAGNOSIS — G459 Transient cerebral ischemic attack, unspecified: Secondary | ICD-10-CM | POA: Diagnosis not present

## 2014-07-07 DIAGNOSIS — I6990 Unspecified sequelae of unspecified cerebrovascular disease: Secondary | ICD-10-CM | POA: Diagnosis not present

## 2014-07-07 DIAGNOSIS — I10 Essential (primary) hypertension: Secondary | ICD-10-CM | POA: Diagnosis not present

## 2014-07-07 DIAGNOSIS — I6529 Occlusion and stenosis of unspecified carotid artery: Secondary | ICD-10-CM | POA: Diagnosis not present

## 2014-07-07 DIAGNOSIS — R51 Headache: Secondary | ICD-10-CM | POA: Diagnosis not present

## 2014-07-08 ENCOUNTER — Other Ambulatory Visit: Payer: Self-pay | Admitting: Neurology

## 2014-07-08 DIAGNOSIS — I6990 Unspecified sequelae of unspecified cerebrovascular disease: Secondary | ICD-10-CM

## 2014-08-04 DIAGNOSIS — E039 Hypothyroidism, unspecified: Secondary | ICD-10-CM | POA: Diagnosis not present

## 2014-08-04 DIAGNOSIS — E785 Hyperlipidemia, unspecified: Secondary | ICD-10-CM | POA: Diagnosis not present

## 2014-08-04 DIAGNOSIS — H11432 Conjunctival hyperemia, left eye: Secondary | ICD-10-CM | POA: Diagnosis not present

## 2014-08-04 DIAGNOSIS — Z23 Encounter for immunization: Secondary | ICD-10-CM | POA: Diagnosis not present

## 2014-08-04 DIAGNOSIS — G459 Transient cerebral ischemic attack, unspecified: Secondary | ICD-10-CM | POA: Diagnosis not present

## 2014-09-02 ENCOUNTER — Ambulatory Visit (HOSPITAL_COMMUNITY)
Admission: RE | Admit: 2014-09-02 | Discharge: 2014-09-02 | Disposition: A | Discharge status: Home / Self Care | Payer: Medicare Other | Source: Ambulatory Visit | Attending: Neurology | Admitting: Neurology

## 2014-09-02 DIAGNOSIS — E785 Hyperlipidemia, unspecified: Secondary | ICD-10-CM | POA: Insufficient documentation

## 2014-09-02 DIAGNOSIS — F17210 Nicotine dependence, cigarettes, uncomplicated: Secondary | ICD-10-CM | POA: Diagnosis not present

## 2014-09-02 DIAGNOSIS — I6523 Occlusion and stenosis of bilateral carotid arteries: Secondary | ICD-10-CM | POA: Diagnosis not present

## 2014-09-02 DIAGNOSIS — I6990 Unspecified sequelae of unspecified cerebrovascular disease: Secondary | ICD-10-CM

## 2014-09-28 DIAGNOSIS — G459 Transient cerebral ischemic attack, unspecified: Secondary | ICD-10-CM | POA: Diagnosis not present

## 2014-09-28 DIAGNOSIS — E785 Hyperlipidemia, unspecified: Secondary | ICD-10-CM | POA: Diagnosis not present

## 2014-09-28 DIAGNOSIS — E039 Hypothyroidism, unspecified: Secondary | ICD-10-CM | POA: Diagnosis not present

## 2014-09-28 DIAGNOSIS — H11432 Conjunctival hyperemia, left eye: Secondary | ICD-10-CM | POA: Diagnosis not present

## 2014-12-04 DIAGNOSIS — E039 Hypothyroidism, unspecified: Secondary | ICD-10-CM | POA: Diagnosis not present

## 2014-12-04 DIAGNOSIS — G459 Transient cerebral ischemic attack, unspecified: Secondary | ICD-10-CM | POA: Diagnosis not present

## 2014-12-04 DIAGNOSIS — E785 Hyperlipidemia, unspecified: Secondary | ICD-10-CM | POA: Diagnosis not present

## 2015-01-04 DIAGNOSIS — I10 Essential (primary) hypertension: Secondary | ICD-10-CM | POA: Diagnosis not present

## 2015-01-04 DIAGNOSIS — Z79899 Other long term (current) drug therapy: Secondary | ICD-10-CM | POA: Diagnosis not present

## 2015-01-04 DIAGNOSIS — I6529 Occlusion and stenosis of unspecified carotid artery: Secondary | ICD-10-CM | POA: Diagnosis not present

## 2015-01-04 DIAGNOSIS — I6990 Unspecified sequelae of unspecified cerebrovascular disease: Secondary | ICD-10-CM | POA: Diagnosis not present

## 2015-03-13 IMAGING — US US CAROTID DUPLEX BILAT
1 series · 13 of 24 positions shown · non-contrast
Comparison: None.

CLINICAL DATA: Stroke symptoms, hyperlipidemia, smoker

EXAM:
BILATERAL CAROTID DUPLEX ULTRASOUND
TECHNIQUE: Gray scale imaging, color Doppler and duplex ultrasound were
performed of bilateral carotid and vertebral arteries in the neck.

[Series 1: us carotid duplex bilat · 0.06mm/px · 13 of 68 slices shown]
[im 1/68]
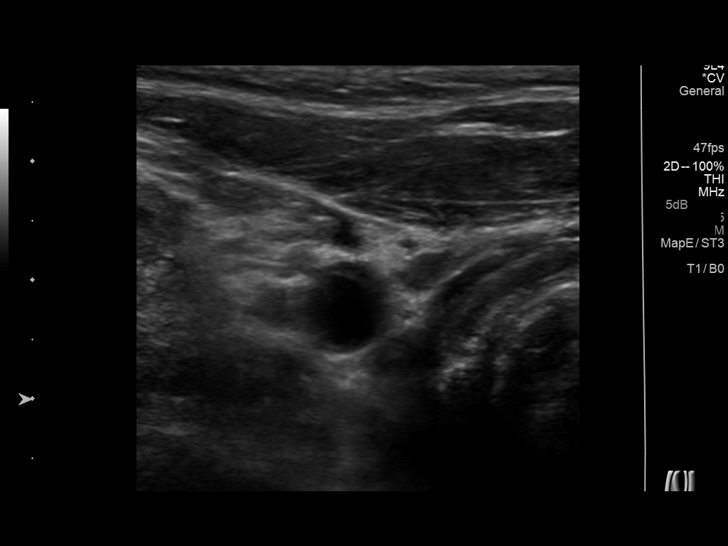
[im 6/68]
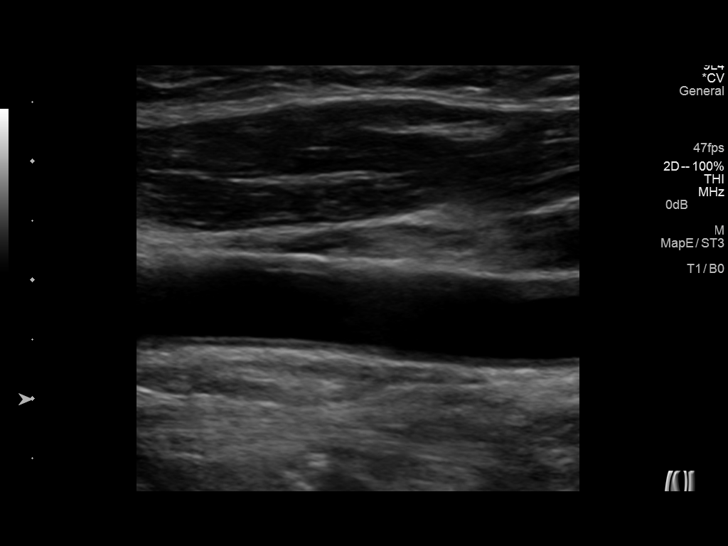
[im 12/68]
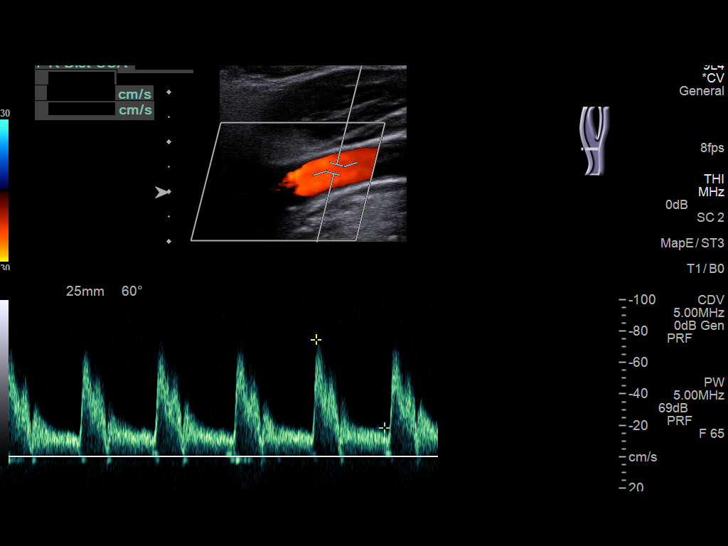
[im 18/68]
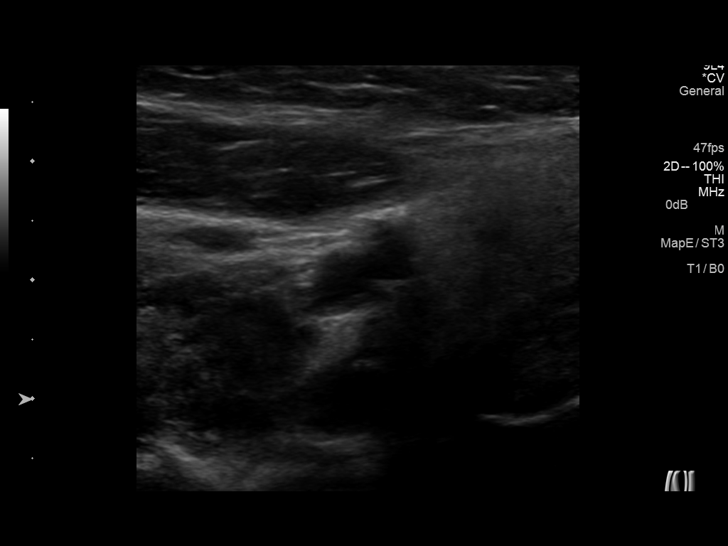
[im 24/68]
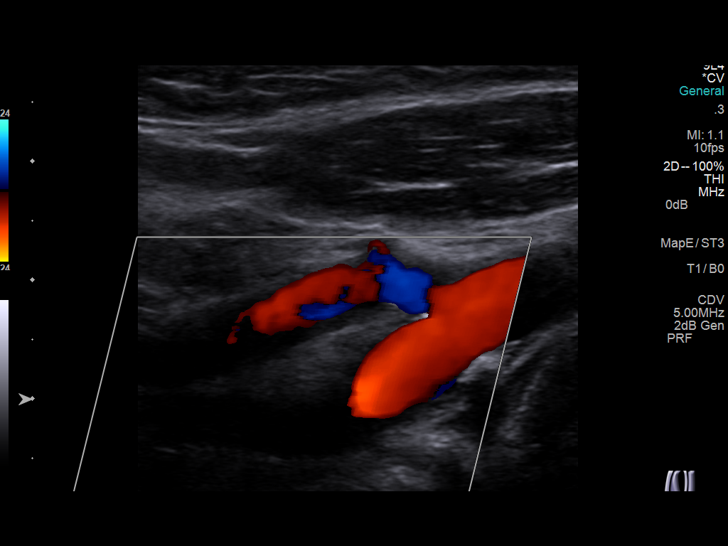
[im 30/68]
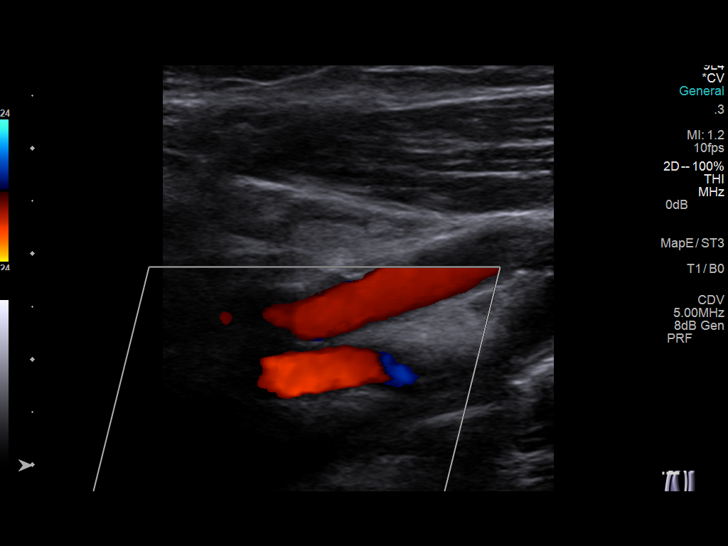
[im 35/68]
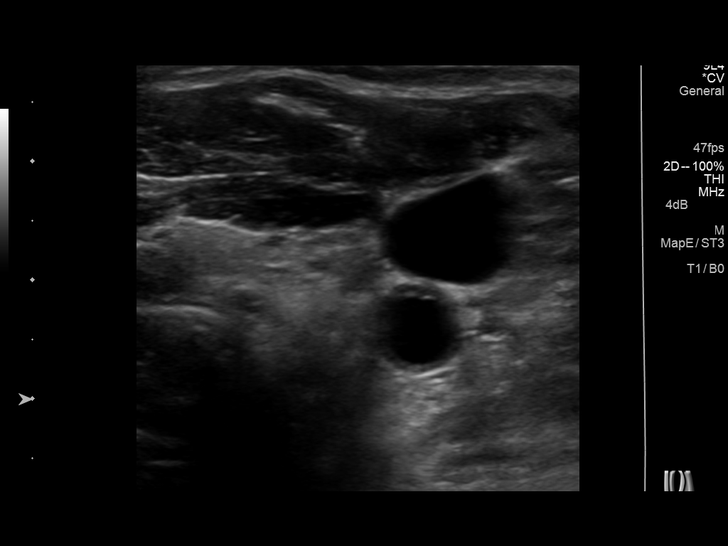
[im 38/68]
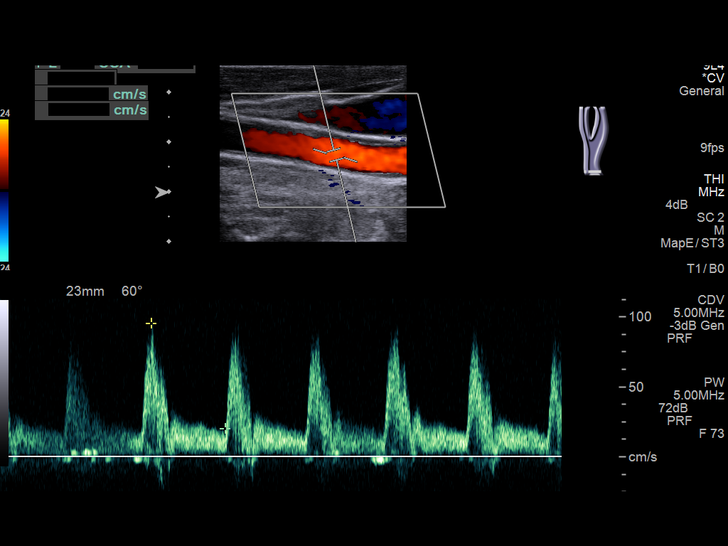
[im 44/68]
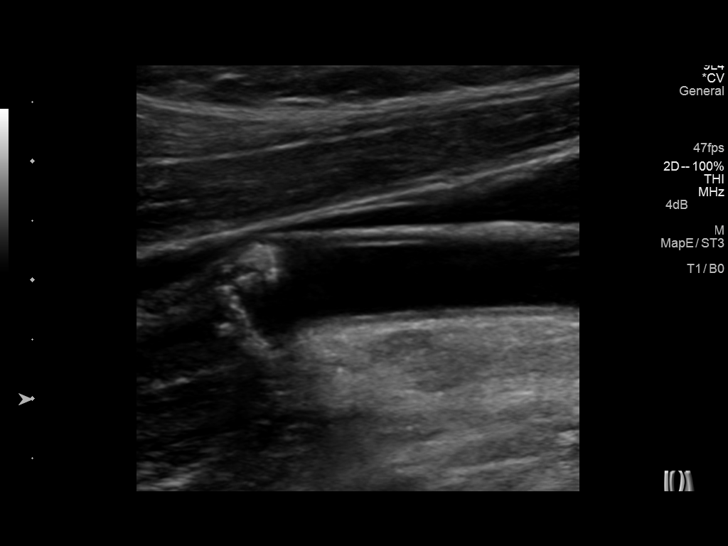
[im 50/68]
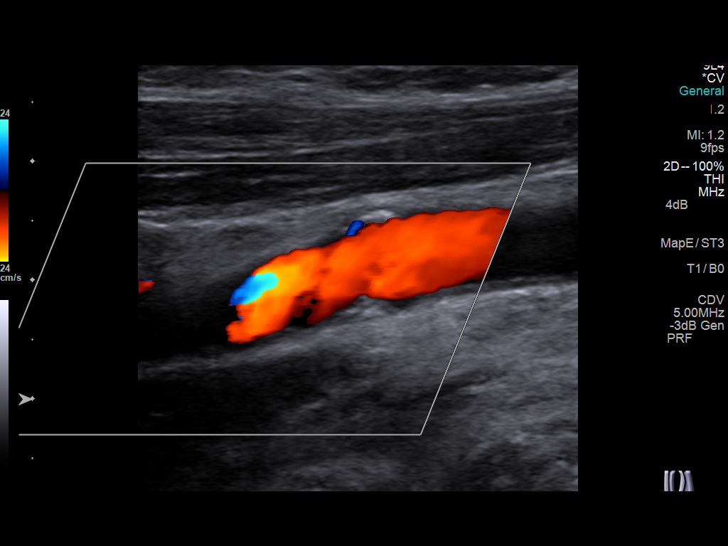
[im 56/68]
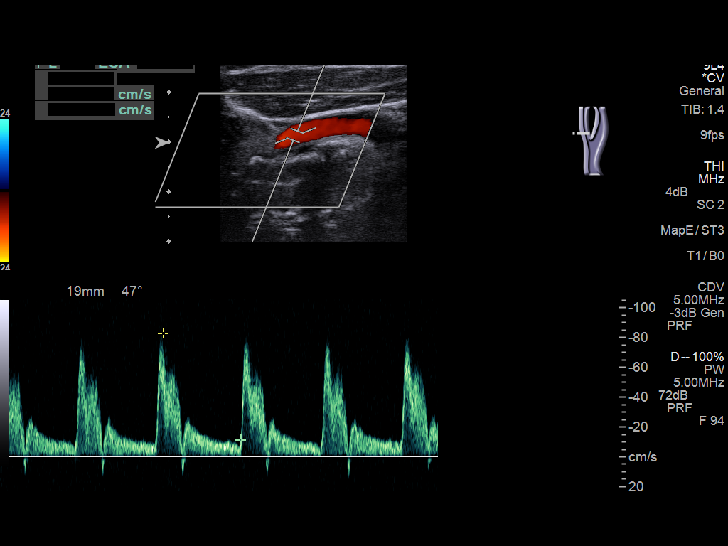
[im 62/68]
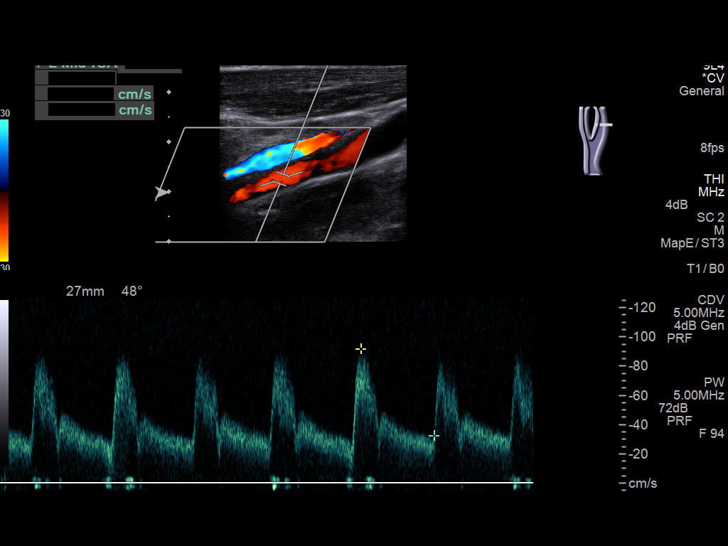
[im 68/68]
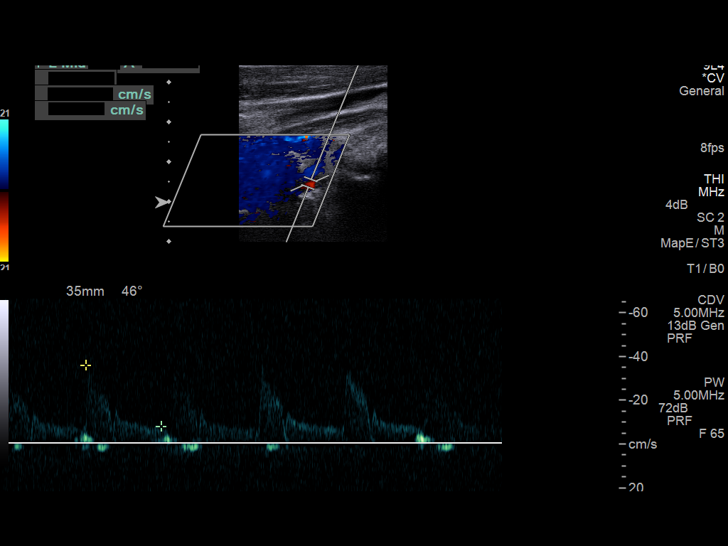

[13 of 24 positions shown; findings below may reference images not displayed]

FINDINGS: Criteria: Quantification of carotid stenosis is based on velocity
parameters that correlate the residual internal carotid diameter
with NASCET-based stenosis levels, using the diameter of the distal
internal carotid lumen as the denominator for stenosis measurement.

The following velocity measurements were obtained:

RIGHT

ICA:  99/31 cm/sec

CCA:  83/18 cm/sec

SYSTOLIC ICA/CCA RATIO:

DIASTOLIC ICA/CCA RATIO:

ECA:  89 cm/sec

LEFT

ICA:  92/37 cm/sec

CCA:  87/19 cm/sec

SYSTOLIC ICA/CCA RATIO:

DIASTOLIC ICA/CCA RATIO:

ECA:  83 Cm/sec

RIGHT CAROTID ARTERY: Minor echogenic shadowing plaque formation. No
hemodynamically significant right ICA stenosis, velocity elevation,
or turbulent flow. Degree of narrowing less than 50%.

RIGHT VERTEBRAL ARTERY:  Antegrade

LEFT CAROTID ARTERY: Similar scattered minor echogenic plaque
formation. No hemodynamically significant left ICA stenosis,
velocity elevation, or turbulent flow.

LEFT VERTEBRAL ARTERY:  Antegrade
IMPRESSION: Minor carotid atherosclerosis. No hemodynamic significant ICA
stenosis by ultrasound. Degree of narrowing less than 50%
bilaterally.

## 2015-03-29 DIAGNOSIS — E039 Hypothyroidism, unspecified: Secondary | ICD-10-CM | POA: Diagnosis not present

## 2015-03-29 DIAGNOSIS — E785 Hyperlipidemia, unspecified: Secondary | ICD-10-CM | POA: Diagnosis not present

## 2015-03-29 DIAGNOSIS — G459 Transient cerebral ischemic attack, unspecified: Secondary | ICD-10-CM | POA: Diagnosis not present

## 2015-04-05 DIAGNOSIS — E039 Hypothyroidism, unspecified: Secondary | ICD-10-CM | POA: Diagnosis not present

## 2015-04-05 DIAGNOSIS — E785 Hyperlipidemia, unspecified: Secondary | ICD-10-CM | POA: Diagnosis not present

## 2015-04-05 DIAGNOSIS — G459 Transient cerebral ischemic attack, unspecified: Secondary | ICD-10-CM | POA: Diagnosis not present

## 2015-08-13 DIAGNOSIS — Z23 Encounter for immunization: Secondary | ICD-10-CM | POA: Diagnosis not present

## 2015-09-27 DIAGNOSIS — G459 Transient cerebral ischemic attack, unspecified: Secondary | ICD-10-CM | POA: Diagnosis not present

## 2015-09-27 DIAGNOSIS — E785 Hyperlipidemia, unspecified: Secondary | ICD-10-CM | POA: Diagnosis not present

## 2015-09-27 DIAGNOSIS — E039 Hypothyroidism, unspecified: Secondary | ICD-10-CM | POA: Diagnosis not present

## 2015-10-05 DIAGNOSIS — E039 Hypothyroidism, unspecified: Secondary | ICD-10-CM | POA: Diagnosis not present

## 2015-10-05 DIAGNOSIS — G459 Transient cerebral ischemic attack, unspecified: Secondary | ICD-10-CM | POA: Diagnosis not present

## 2015-10-05 DIAGNOSIS — E785 Hyperlipidemia, unspecified: Secondary | ICD-10-CM | POA: Diagnosis not present

## 2015-10-05 DIAGNOSIS — L723 Sebaceous cyst: Secondary | ICD-10-CM | POA: Diagnosis not present

## 2016-01-03 ENCOUNTER — Encounter (HOSPITAL_COMMUNITY): Payer: Self-pay | Admitting: Emergency Medicine

## 2016-01-03 ENCOUNTER — Other Ambulatory Visit: Payer: Self-pay | Admitting: Neurology

## 2016-01-03 ENCOUNTER — Emergency Department (HOSPITAL_COMMUNITY)
Admission: EM | Admit: 2016-01-03 | Discharge: 2016-01-04 | Disposition: A | Discharge status: Home / Self Care | Payer: Medicare Other | Attending: Emergency Medicine | Admitting: Emergency Medicine

## 2016-01-03 ENCOUNTER — Emergency Department (HOSPITAL_COMMUNITY): Payer: Medicare Other

## 2016-01-03 DIAGNOSIS — Z79899 Other long term (current) drug therapy: Secondary | ICD-10-CM | POA: Diagnosis not present

## 2016-01-03 DIAGNOSIS — N200 Calculus of kidney: Secondary | ICD-10-CM | POA: Diagnosis not present

## 2016-01-03 DIAGNOSIS — Z7982 Long term (current) use of aspirin: Secondary | ICD-10-CM | POA: Diagnosis not present

## 2016-01-03 DIAGNOSIS — I6529 Occlusion and stenosis of unspecified carotid artery: Secondary | ICD-10-CM | POA: Diagnosis not present

## 2016-01-03 DIAGNOSIS — I10 Essential (primary) hypertension: Secondary | ICD-10-CM | POA: Diagnosis not present

## 2016-01-03 DIAGNOSIS — Z87891 Personal history of nicotine dependence: Secondary | ICD-10-CM | POA: Insufficient documentation

## 2016-01-03 DIAGNOSIS — K529 Noninfective gastroenteritis and colitis, unspecified: Secondary | ICD-10-CM | POA: Insufficient documentation

## 2016-01-03 DIAGNOSIS — I6990 Unspecified sequelae of unspecified cerebrovascular disease: Secondary | ICD-10-CM | POA: Diagnosis not present

## 2016-01-03 DIAGNOSIS — E785 Hyperlipidemia, unspecified: Secondary | ICD-10-CM | POA: Diagnosis not present

## 2016-01-03 DIAGNOSIS — R1110 Vomiting, unspecified: Secondary | ICD-10-CM | POA: Diagnosis present

## 2016-01-03 DIAGNOSIS — I951 Orthostatic hypotension: Secondary | ICD-10-CM | POA: Diagnosis not present

## 2016-01-03 DIAGNOSIS — E039 Hypothyroidism, unspecified: Secondary | ICD-10-CM | POA: Diagnosis not present

## 2016-01-03 DIAGNOSIS — M79606 Pain in leg, unspecified: Secondary | ICD-10-CM | POA: Diagnosis not present

## 2016-01-03 DIAGNOSIS — G4459 Other complicated headache syndrome: Secondary | ICD-10-CM | POA: Diagnosis not present

## 2016-01-03 DIAGNOSIS — I6523 Occlusion and stenosis of bilateral carotid arteries: Secondary | ICD-10-CM

## 2016-01-03 LAB — COMPREHENSIVE METABOLIC PANEL
ALT: 21 U/L (ref 14–54)
AST: 25 U/L (ref 15–41)
Albumin: 4.5 g/dL (ref 3.5–5.0)
Alkaline Phosphatase: 74 U/L (ref 38–126)
Anion gap: 8 (ref 5–15)
BUN: 19 mg/dL (ref 6–20)
CHLORIDE: 105 mmol/L (ref 101–111)
CO2: 25 mmol/L (ref 22–32)
Calcium: 9.2 mg/dL (ref 8.9–10.3)
Creatinine, Ser: 0.89 mg/dL (ref 0.44–1.00)
GFR calc non Af Amer: >60 mL/min (ref >60)
Glucose, Bld: 136 mg/dL — ABNORMAL HIGH (ref 65–99)
Potassium: 3.8 mmol/L (ref 3.5–5.1)
Sodium: 138 mmol/L (ref 135–145)
Total Bilirubin: 0.7 mg/dL (ref 0.3–1.2)
Total Protein: 7.6 g/dL (ref 6.5–8.1)

## 2016-01-03 LAB — URINALYSIS, ROUTINE W REFLEX MICROSCOPIC
BILIRUBIN URINE: NEGATIVE
GLUCOSE, UA: NEGATIVE mg/dL
HGB URINE DIPSTICK: NEGATIVE
Ketones, ur: NEGATIVE mg/dL
Leukocytes, UA: NEGATIVE
Nitrite: NEGATIVE
PROTEIN: NEGATIVE mg/dL
Specific Gravity, Urine: 1.02 (ref 1.005–1.030)
pH: 6.5 (ref 5.0–8.0)

## 2016-01-03 LAB — INFLUENZA PANEL BY PCR (TYPE A & B)
H1N1FLUPCR: NOT DETECTED
Influenza A By PCR: NEGATIVE
Influenza B By PCR: NEGATIVE

## 2016-01-03 LAB — CBC
HCT: 41.4 % (ref 36.0–46.0)
Hemoglobin: 13.2 g/dL (ref 12.0–15.0)
MCH: 28.8 pg (ref 26.0–34.0)
MCHC: 31.9 g/dL (ref 30.0–36.0)
MCV: 90.2 fL (ref 78.0–100.0)
Platelets: 233 10*3/uL (ref 150–400)
RBC: 4.59 MIL/uL (ref 3.87–5.11)
RDW: 14.2 % (ref 11.5–15.5)
WBC: 8.7 10*3/uL (ref 4.0–10.5)

## 2016-01-03 LAB — TROPONIN I: Troponin I: <0.03 ng/mL (ref <0.031)

## 2016-01-03 LAB — LIPASE, BLOOD: LIPASE: 23 U/L (ref 11–51)

## 2016-01-03 MED ORDER — IOHEXOL 300 MG/ML  SOLN
100.0000 mL | Freq: Once | INTRAMUSCULAR | Status: AC | PRN
  Administered 2016-01-03: 100 mL via INTRAVENOUS

## 2016-01-03 MED ORDER — PROMETHAZINE HCL 25 MG/ML IJ SOLN
12.5000 mg | Freq: Four times a day (QID) | INTRAMUSCULAR | Status: DC | PRN
  Administered 2016-01-04: 12.5 mg via INTRAVENOUS
  Filled 2016-01-03: qty 1

## 2016-01-03 MED ORDER — ONDANSETRON HCL 4 MG/2ML IJ SOLN
4.0000 mg | Freq: Once | INTRAMUSCULAR | Status: AC
  Administered 2016-01-03: 4 mg via INTRAVENOUS
  Filled 2016-01-03: qty 2

## 2016-01-03 MED ORDER — SODIUM CHLORIDE 0.9 % IV BOLUS (SEPSIS)
1000.0000 mL | Freq: Once | INTRAVENOUS | Status: AC
  Administered 2016-01-03: 1000 mL via INTRAVENOUS

## 2016-01-03 MED ORDER — DIATRIZOATE MEGLUMINE & SODIUM 66-10 % PO SOLN
ORAL | Status: AC
  Filled 2016-01-03: qty 30

## 2016-01-03 NOTE — ED Notes (Signed)
Pt reports n/v and abdominal cramping x 3 hours. Denies diarrhea.

## 2016-01-03 NOTE — ED Notes (Signed)
Pt actively vomiting upon entering room.  No fluids given at this time  Pt ambulated to and from bathroom with no assistance.  Continues to vomit

## 2016-01-03 NOTE — ED Provider Notes (Signed)
CSN: [id number]     Arrival date & time 01/03/16  1759 History  By signing my name below, I, Soijett Blue, attest that this documentation has been prepared under the direction and in the presence of Glynn Octave, MD. Electronically Signed: Soijett Blue, ED Scribe. 01/03/2016. 8:31 PM.   Chief Complaint  Patient presents with  . Emesis     The history is provided by the patient. No language interpreter was used.    Alexis Ross is a 70 y.o. female with a medical hx of hyperlipidemia, who presents to the Emergency Department complaining of vomiting x 6 episodes onset 2 PM today. Pt states that her symptoms began with chills followed by vomiting. Pt reports that she went to the restroom to vomit, and she passed out. Pt relative notes that the pt was slumped over in the chair and didn't respond initially, pt came too after a minute.  Pt had syncopal episodes in the past and Dr. Juanetta Gosling found the cause of her symptoms which due to medications. Pt reports that she ate food prior to the onset of her symptoms, but denies that being the cause of her symptoms. Pt denies any new medications.    Pt is having associated symptoms of constant, moderate, generalized abdominal cramping and diarrhea x 1 episodes She notes that she has not tried any medications for the relief of her symptoms. She denies fever, CP, nausea, and any other symptoms. Denies any cardiac, lung issues, MI, or stent placement. Denies PMHx of abdominal surgeries.    Past Medical History  Diagnosis Date  . Hyperlipidemia   . Hypothyroidism   . Anemia   . Arthritis     "knees, fingers" (12/19/2013)  . Accidental fall 12/19/2013    "tripped over pallet in store; briefly lost consciousness" (12/20/2013)  . Stroke [facility]) 12/2011    residual "left sided weakness" (12/19/2013)   Past Surgical History  Procedure Laterality Date  . No past surgeries     No family history on file. Social History  Substance Use Topics  . Smoking status:  Former Smoker -- 0.50 packs/day for 30 years    Types: Cigarettes  . Smokeless tobacco: Never Used     Comment: 12/19/2013 "quit smoking in ~ 1994"  . Alcohol Use: Yes     Comment: 12/19/2013 "drank a little when I was young"   OB History    No data available     Review of Systems   A complete 10 system review of systems was obtained and all systems are negative except as noted in the HPI and PMH.   Allergies  Review of patient's allergies indicates no known allergies.  Home Medications   Prior to Admission medications   Medication Sig Start Date End Date Taking? Authorizing Provider  aspirin 325 MG tablet Take 325 mg by mouth daily.   Yes Historical Provider, MD  clopidogrel (PLAVIX) 75 MG tablet Take 75 mg by mouth daily with breakfast.   Yes Historical Provider, MD  levothyroxine (SYNTHROID, LEVOTHROID) 50 MCG tablet Take 50 mcg by mouth daily before breakfast.   Yes Historical Provider, MD  Multiple Vitamin (MULITIVITAMIN WITH MINERALS) TABS Take 1 tablet by mouth daily.   Yes Historical Provider, MD  pravastatin (PRAVACHOL) 80 MG tablet Take 80 mg by mouth daily.   Yes Historical Provider, MD  acetaminophen (TYLENOL) 500 MG tablet Take 1,000 mg by mouth every 6 (six) hours as needed. For pain    Historical Provider, MD  docusate  sodium (COLACE) 100 MG capsule Take 1 capsule (100 mg total) by mouth every 12 (twelve) hours. Patient not taking: Reported on 01/03/2016 12/19/13   Layla MawKristen N Ward, DO  HYDROcodone-acetaminophen (NORCO/VICODIN) 5-325 MG per tablet Take 1-2 tablets by mouth every 6 (six) hours as needed for severe pain. Patient not taking: Reported on 01/03/2016 12/21/13   Nonie HoyerMegan N Baird, PA-C  levothyroxine (SYNTHROID, LEVOTHROID) 50 MCG tablet Take 1 tablet (50 mcg total) by mouth daily before breakfast. Patient not taking: Reported on 01/03/2016 12/27/11 12/26/12  Kari BaarsEdward Hawkins, MD  naproxen sodium (ANAPROX) 220 MG tablet Take 220 mg by mouth every 8 (eight) hours as needed.  Arthritis pain    Historical Provider, MD  ondansetron (ZOFRAN) 4 MG tablet Take 1 tablet (4 mg total) by mouth every 6 (six) hours. 01/04/16   Glynn OctaveStephen Javian Nudd, MD  oxyCODONE-acetaminophen (PERCOCET/ROXICET) 5-325 MG per tablet Take 1-2 tablets by mouth every 4 (four) hours as needed. Patient not taking: Reported on 01/03/2016 12/19/13   Kristen N Ward, DO   BP 134/54 mmHg  Pulse 91  Temp(Src) 100.3 F (37.9 C) (Rectal)  Resp 17  Ht 5\' 6"  (1.676 m)  Wt 185 lb (83.915 kg)  BMI 29.87 kg/m2  SpO2 97% Physical Exam  Constitutional: She is oriented to person, place, and time. She appears well-developed and well-nourished. No distress.  HENT:  Head: Normocephalic and atraumatic.  Mouth/Throat: Oropharynx is clear and moist. Mucous membranes are dry. No oropharyngeal exudate.  Eyes: Conjunctivae and EOM are normal. Pupils are equal, round, and reactive to light.  Neck: Normal range of motion. Neck supple.  No meningismus.  Cardiovascular: Normal rate, regular rhythm, normal heart sounds and intact distal pulses.   No murmur heard. Pulmonary/Chest: Effort normal and breath sounds normal. No respiratory distress.  Abdominal: Soft. There is no tenderness. There is no rebound and no guarding.  Mild diffuse tenderness  Musculoskeletal: Normal range of motion. She exhibits no edema or tenderness.  Neurological: She is alert and oriented to person, place, and time. No cranial nerve deficit. She exhibits normal muscle tone. Coordination normal.  No ataxia on finger to nose bilaterally. No pronator drift. 5/5 strength throughout. CN 2-12 intact.Equal grip strength. Sensation intact.   Skin: Skin is warm.  Psychiatric: She has a normal mood and affect. Her behavior is normal.  Nursing note and vitals reviewed.   ED Course  Procedures (including critical care time) DIAGNOSTIC STUDIES: Oxygen Saturation is 100% on RA, nl by my interpretation.    COORDINATION OF CARE: 8:29 PM Discussed treatment  plan with pt at bedside which includes labs, UA and pt agreed to plan.    Labs Review Labs Reviewed  COMPREHENSIVE METABOLIC PANEL - Abnormal; Notable for the following:    Glucose, Bld 136 (*)    All other components within normal limits  URINALYSIS, ROUTINE W REFLEX MICROSCOPIC (NOT AT [facility]) - Abnormal; Notable for the following:    APPearance HAZY (*)    All other components within normal limits  LIPASE, BLOOD  CBC  TROPONIN I  INFLUENZA PANEL BY PCR (TYPE A & B, H1N1)  TROPONIN I    Imaging Review Ct Abdomen Pelvis W Contrast  01/03/2016  CLINICAL DATA:  Vomiting and chills, onset at 14:00 today. EXAM: CT ABDOMEN AND PELVIS WITH CONTRAST TECHNIQUE: Multidetector CT imaging of the abdomen and pelvis was performed using the standard protocol following bolus administration of intravenous contrast. CONTRAST:  100mL OMNIPAQUE IOHEXOL 300 MG/ML  SOLN COMPARISON:  None.  FINDINGS: Lower chest:  No significant abnormality Hepatobiliary: There are normal appearances of the liver, gallbladder and bile ducts. Pancreas: Normal Spleen: Normal Adrenals/Urinary Tract: Mildly thickened adrenals, benign. 3 mm midpole left renal collecting system calculus. Otherwise normal kidneys. Ureters and urinary bladder are unremarkable. Stomach/Bowel: Hiatal hernia. Otherwise normal stomach and small bowel. Normal appendix. Unremarkable colon. Vascular/Lymphatic: The abdominal aorta is normal in caliber. There is mild atherosclerotic calcification. There is no adenopathy in the abdomen or pelvis. Reproductive: Normal uterus and ovaries Other: No acute inflammatory changes are evident in the abdomen or pelvis. No ascites. Musculoskeletal: No significant skeletal lesions. Moderate degenerative lumbar disc disease at L5-S1. Small fat containing umbilical hernia. IMPRESSION: 1. No acute findings are evident in the abdomen or pelvis. 2. Left nephrolithiasis 3. Hiatal hernia Electronically Signed   By: Ellery Plunk M.D.    On: 01/03/2016 23:15   I have personally reviewed and evaluated these lab results as part of my medical decision-making.   EKG Interpretation   Date/Time:  Monday January 03 2016 20:38:51 EDT Ventricular Rate:  81 PR Interval:  139 QRS Duration: 97 QT Interval:  374 QTC Calculation: 434 R Axis:   22 Text Interpretation:  Sinus rhythm Consider right atrial enlargement  Abnormal R-wave progression, early transition Minimal ST depression,  inferior leads Minimal ST elevation, anterior leads Nonspecific ST  abnormality Confirmed by Manus Gunning  MD, Tynlee Bayle [phone]) on 01/03/2016 9:05:15  PM      MDM   Final diagnoses:  Gastroenteritis  Orthostasis   patient with chills, nausea vomiting and diarrhea onset this afternoon. Family member states she "passed out" while sitting in a chair was less responsive for a minute or so. No chest pain or shortness of breath.  Patient is in no distress. She has a temperature of 100.3. Abdomen is diffusely tender but soft.  UA is negative. Labs reassuring.  Orthostatics are positive, this is likely the source of her possible syncope. EKG nsr. No murmurs. CT scan negative. Troponin negative x2.   Patient continues to have vomiting in the ED.  Phenergan will be given. Continue IVF.   Suspect viral gastroenteritis with orthostatic syncope. Will need to be able to tolerate PO before discharge home. Dr. Lynelle Doctor to assume care.   I personally performed the services described in this documentation, which was scribed in my presence. The recorded information has been reviewed and is accurate.    Glynn Octave, MD 01/04/16 [phone]

## 2016-01-04 MED ORDER — ONDANSETRON HCL 4 MG PO TABS: 4.0000 mg | ORAL_TABLET | Freq: Four times a day (QID) | ORAL | Status: DC

## 2016-01-04 NOTE — ED Notes (Signed)
Pt sipping Ginger Ale, no vomiting since phenergan

## 2016-01-04 NOTE — ED Notes (Signed)
Dr. Lynelle DoctorKnapp spoke with pt and family and explained all results and labs.  Explained that there were not any abnormal results that would justify admitting pt.  Pt transferred self to wheelchair and was taken from ED by daughter.

## 2016-01-04 NOTE — ED Notes (Addendum)
Upon daughters arrival to hospital pt claimed she was unable to stand on own.  PT had incontinence of stool and urine.  Brought pt to room and pt was able to clean self with minimal assistance.  Transferred self to and from wheelchair to toilet and back.  PT still having watery stools

## 2016-01-04 NOTE — Discharge Instructions (Signed)
Viral Gastroenteritis °Keep yourself hydrated. Follow up with your doctor. Return to the ED if you develop new or worsening symptoms. °Viral gastroenteritis is also known as stomach flu. This condition affects the stomach and intestinal tract. It can cause sudden diarrhea and vomiting. The illness typically lasts 3 to 8 days. Most people develop an immune response that eventually gets rid of the virus. While this natural response develops, the virus can make you quite ill. °CAUSES  °Many different viruses can cause gastroenteritis, such as rotavirus or noroviruses. You can catch one of these viruses by consuming contaminated food or water. You may also catch a virus by sharing utensils or other personal items with an infected person or by touching a contaminated surface. °SYMPTOMS  °The most common symptoms are diarrhea and vomiting. These problems can cause a severe loss of body fluids (dehydration) and a body salt (electrolyte) imbalance. Other symptoms may include: °· Fever. °· Headache. °· Fatigue. °· Abdominal pain. °DIAGNOSIS  °Your caregiver can usually diagnose viral gastroenteritis based on your symptoms and a physical exam. A stool sample may also be taken to test for the presence of viruses or other infections. °TREATMENT  °This illness typically goes away on its own. Treatments are aimed at rehydration. The most serious cases of viral gastroenteritis involve vomiting so severely that you are not able to keep fluids down. In these cases, fluids must be given through an intravenous line (IV). °HOME CARE INSTRUCTIONS  °· Drink enough fluids to keep your urine clear or pale yellow. Drink small amounts of fluids frequently and increase the amounts as tolerated. °· Ask your caregiver for specific rehydration instructions. °· Avoid: °¨ Foods high in sugar. °¨ Alcohol. °¨ Carbonated drinks. °¨ Tobacco. °¨ Juice. °¨ Caffeine drinks. °¨ Extremely hot or cold fluids. °¨ Fatty, greasy foods. °¨ Too much intake of  anything at one time. °¨ Dairy products until 24 to 48 hours after diarrhea stops. °· You may consume probiotics. Probiotics are active cultures of beneficial bacteria. They may lessen the amount and number of diarrheal stools in adults. Probiotics can be found in yogurt with active cultures and in supplements. °· Wash your hands well to avoid spreading the virus. °· Only take over-the-counter or prescription medicines for pain, discomfort, or fever as directed by your caregiver. Do not give aspirin to children. Antidiarrheal medicines are not recommended. °· Ask your caregiver if you should continue to take your regular prescribed and over-the-counter medicines. °· Keep all follow-up appointments as directed by your caregiver. °SEEK IMMEDIATE MEDICAL CARE IF:  °· You are unable to keep fluids down. °· You do not urinate at least once every 6 to 8 hours. °· You develop shortness of breath. °· You notice blood in your stool or vomit. This may look like coffee grounds. °· You have abdominal pain that increases or is concentrated in one small area (localized). °· You have persistent vomiting or diarrhea. °· You have a fever. °· The patient is a child younger than 3 months, and he or she has a fever. °· The patient is a child older than 3 months, and he or she has a fever and persistent symptoms. °· The patient is a child older than 3 months, and he or she has a fever and symptoms suddenly get worse. °· The patient is a baby, and he or she has no tears when crying. °MAKE SURE YOU:  °· Understand these instructions. °· Will watch your condition. °· Will get help right   away if you are not doing well or get worse. °  °This information is not intended to replace advice given to you by your health care provider. Make sure you discuss any questions you have with your health care provider. °  °Document Released: 10/02/2005 Document Revised: 12/25/2011 Document Reviewed: 07/19/2011 °Elsevier Interactive Patient Education ©2016  [employer] ° °

## 2016-01-04 NOTE — ED Provider Notes (Addendum)
Pt left at change of shift to see how she would do after getting phenergan for her nausea. She has been able to drink fluids without vomiting. Pt was discharged.    Patient return to the ED at 3:40 AM after having stool in her clothes in the waiting room as she was being discharged. Patient denies any nausea. Have explained to the patient and her daughter that her test results are all good, she has a stomach infection or virus that can be managed at home.  Alexis AlbeIva Jiselle Sheu, MD, Concha PyoFACEP   Gia Lusher, MD 01/04/16 [phone]  Alexis AlbeIva Jaleesa Cervi, MD 01/04/16 [phone]

## 2016-01-04 NOTE — ED Notes (Addendum)
Pt states understanding of care given and follow up instructions. Pt walked to and from bathroom unassisted and dressed self prior to discharge. Pt transferred self from bed to wheelchair.  Wheeled pt to waiting room to wait for daughter

## 2016-01-07 ENCOUNTER — Ambulatory Visit (HOSPITAL_COMMUNITY): Payer: Medicare Other

## 2016-01-21 ENCOUNTER — Ambulatory Visit (HOSPITAL_COMMUNITY)
Admission: RE | Admit: 2016-01-21 | Discharge: 2016-01-21 | Disposition: A | Discharge status: Home / Self Care | Payer: Medicare Other | Source: Ambulatory Visit | Attending: Neurology | Admitting: Neurology

## 2016-01-21 DIAGNOSIS — I6523 Occlusion and stenosis of bilateral carotid arteries: Secondary | ICD-10-CM

## 2016-01-21 DIAGNOSIS — R0989 Other specified symptoms and signs involving the circulatory and respiratory systems: Secondary | ICD-10-CM | POA: Insufficient documentation

## 2016-04-04 DIAGNOSIS — G459 Transient cerebral ischemic attack, unspecified: Secondary | ICD-10-CM | POA: Diagnosis not present

## 2016-04-04 DIAGNOSIS — E785 Hyperlipidemia, unspecified: Secondary | ICD-10-CM | POA: Diagnosis not present

## 2016-04-04 DIAGNOSIS — I10 Essential (primary) hypertension: Secondary | ICD-10-CM | POA: Diagnosis not present

## 2016-04-04 DIAGNOSIS — E039 Hypothyroidism, unspecified: Secondary | ICD-10-CM | POA: Diagnosis not present

## 2016-04-10 DIAGNOSIS — E785 Hyperlipidemia, unspecified: Secondary | ICD-10-CM | POA: Diagnosis not present

## 2016-04-10 DIAGNOSIS — G459 Transient cerebral ischemic attack, unspecified: Secondary | ICD-10-CM | POA: Diagnosis not present

## 2016-04-10 DIAGNOSIS — I10 Essential (primary) hypertension: Secondary | ICD-10-CM | POA: Diagnosis not present

## 2016-04-10 DIAGNOSIS — E039 Hypothyroidism, unspecified: Secondary | ICD-10-CM | POA: Diagnosis not present

## 2016-08-14 DIAGNOSIS — Z23 Encounter for immunization: Secondary | ICD-10-CM | POA: Diagnosis not present

## 2016-10-04 DIAGNOSIS — E785 Hyperlipidemia, unspecified: Secondary | ICD-10-CM | POA: Diagnosis not present

## 2016-10-04 DIAGNOSIS — Z8673 Personal history of transient ischemic attack (TIA), and cerebral infarction without residual deficits: Secondary | ICD-10-CM | POA: Diagnosis not present

## 2016-10-04 DIAGNOSIS — E039 Hypothyroidism, unspecified: Secondary | ICD-10-CM | POA: Diagnosis not present

## 2017-04-04 ENCOUNTER — Other Ambulatory Visit (HOSPITAL_COMMUNITY): Payer: Self-pay | Admitting: Pulmonary Disease

## 2017-04-04 DIAGNOSIS — Z780 Asymptomatic menopausal state: Secondary | ICD-10-CM

## 2017-04-12 ENCOUNTER — Other Ambulatory Visit (HOSPITAL_COMMUNITY): Payer: Medicare Other

## 2017-05-02 ENCOUNTER — Ambulatory Visit (HOSPITAL_COMMUNITY)
Admission: RE | Admit: 2017-05-02 | Discharge: 2017-05-02 | Disposition: A | Discharge status: Home / Self Care | Payer: Medicare Other | Source: Ambulatory Visit | Attending: Pulmonary Disease | Admitting: Pulmonary Disease

## 2017-05-02 DIAGNOSIS — M8588 Other specified disorders of bone density and structure, other site: Secondary | ICD-10-CM | POA: Diagnosis not present

## 2017-05-02 DIAGNOSIS — Z780 Asymptomatic menopausal state: Secondary | ICD-10-CM | POA: Diagnosis not present

## 2017-07-13 DIAGNOSIS — Z23 Encounter for immunization: Secondary | ICD-10-CM | POA: Diagnosis not present

## 2017-09-19 DIAGNOSIS — Z8673 Personal history of transient ischemic attack (TIA), and cerebral infarction without residual deficits: Secondary | ICD-10-CM | POA: Diagnosis not present

## 2017-09-19 DIAGNOSIS — E785 Hyperlipidemia, unspecified: Secondary | ICD-10-CM | POA: Diagnosis not present

## 2017-09-19 DIAGNOSIS — I10 Essential (primary) hypertension: Secondary | ICD-10-CM | POA: Diagnosis not present

## 2017-09-19 DIAGNOSIS — E039 Hypothyroidism, unspecified: Secondary | ICD-10-CM | POA: Diagnosis not present

## 2017-10-04 DIAGNOSIS — Z23 Encounter for immunization: Secondary | ICD-10-CM | POA: Diagnosis not present

## 2017-10-04 DIAGNOSIS — Z0000 Encounter for general adult medical examination without abnormal findings: Secondary | ICD-10-CM | POA: Diagnosis not present

## 2017-11-07 DIAGNOSIS — Z1211 Encounter for screening for malignant neoplasm of colon: Secondary | ICD-10-CM | POA: Diagnosis not present

## 2018-04-02 DIAGNOSIS — E039 Hypothyroidism, unspecified: Secondary | ICD-10-CM | POA: Diagnosis not present

## 2018-04-02 DIAGNOSIS — Z8673 Personal history of transient ischemic attack (TIA), and cerebral infarction without residual deficits: Secondary | ICD-10-CM | POA: Diagnosis not present

## 2018-04-02 DIAGNOSIS — M1990 Unspecified osteoarthritis, unspecified site: Secondary | ICD-10-CM | POA: Diagnosis not present

## 2018-04-02 DIAGNOSIS — E785 Hyperlipidemia, unspecified: Secondary | ICD-10-CM | POA: Diagnosis not present

## 2018-08-07 DIAGNOSIS — Z23 Encounter for immunization: Secondary | ICD-10-CM | POA: Diagnosis not present

## 2018-09-16 ENCOUNTER — Other Ambulatory Visit (HOSPITAL_COMMUNITY): Payer: Self-pay | Admitting: Pulmonary Disease

## 2018-09-16 DIAGNOSIS — R0989 Other specified symptoms and signs involving the circulatory and respiratory systems: Secondary | ICD-10-CM

## 2018-09-25 ENCOUNTER — Ambulatory Visit (HOSPITAL_COMMUNITY)
Admission: RE | Admit: 2018-09-25 | Discharge: 2018-09-25 | Disposition: A | Discharge status: Home / Self Care | Payer: Medicare Other | Source: Ambulatory Visit | Attending: Pulmonary Disease | Admitting: Pulmonary Disease

## 2018-09-25 DIAGNOSIS — R0989 Other specified symptoms and signs involving the circulatory and respiratory systems: Secondary | ICD-10-CM | POA: Diagnosis present

## 2018-09-25 DIAGNOSIS — I6523 Occlusion and stenosis of bilateral carotid arteries: Secondary | ICD-10-CM | POA: Insufficient documentation

## 2018-10-02 DIAGNOSIS — Z0000 Encounter for general adult medical examination without abnormal findings: Secondary | ICD-10-CM | POA: Diagnosis not present

## 2019-03-21 DIAGNOSIS — E039 Hypothyroidism, unspecified: Secondary | ICD-10-CM | POA: Diagnosis not present

## 2019-03-21 DIAGNOSIS — E669 Obesity, unspecified: Secondary | ICD-10-CM | POA: Diagnosis not present

## 2019-03-21 DIAGNOSIS — G459 Transient cerebral ischemic attack, unspecified: Secondary | ICD-10-CM | POA: Diagnosis not present

## 2019-03-21 DIAGNOSIS — M1990 Unspecified osteoarthritis, unspecified site: Secondary | ICD-10-CM | POA: Diagnosis not present

## 2019-03-21 DIAGNOSIS — Z0000 Encounter for general adult medical examination without abnormal findings: Secondary | ICD-10-CM | POA: Diagnosis not present

## 2019-03-21 DIAGNOSIS — E785 Hyperlipidemia, unspecified: Secondary | ICD-10-CM | POA: Diagnosis not present

## 2019-03-21 DIAGNOSIS — Z8673 Personal history of transient ischemic attack (TIA), and cerebral infarction without residual deficits: Secondary | ICD-10-CM | POA: Diagnosis not present

## 2019-04-03 DIAGNOSIS — M1990 Unspecified osteoarthritis, unspecified site: Secondary | ICD-10-CM | POA: Diagnosis not present

## 2019-04-03 DIAGNOSIS — G459 Transient cerebral ischemic attack, unspecified: Secondary | ICD-10-CM | POA: Diagnosis not present

## 2019-04-03 DIAGNOSIS — E785 Hyperlipidemia, unspecified: Secondary | ICD-10-CM | POA: Diagnosis not present

## 2019-10-01 DIAGNOSIS — E785 Hyperlipidemia, unspecified: Secondary | ICD-10-CM | POA: Diagnosis not present

## 2019-10-01 DIAGNOSIS — E039 Hypothyroidism, unspecified: Secondary | ICD-10-CM | POA: Diagnosis not present

## 2020-03-23 ENCOUNTER — Ambulatory Visit (INDEPENDENT_AMBULATORY_CARE_PROVIDER_SITE_OTHER): Payer: Medicare Other | Admitting: Family Medicine

## 2020-03-23 ENCOUNTER — Encounter: Payer: Self-pay | Admitting: Family Medicine

## 2020-03-23 ENCOUNTER — Other Ambulatory Visit: Payer: Self-pay

## 2020-03-23 ENCOUNTER — Other Ambulatory Visit: Payer: Self-pay | Admitting: *Deleted

## 2020-03-23 VITALS — BP 140/88 | HR 80 | Temp 98.0°F | Ht 67.0 in | Wt 189.0 lb

## 2020-03-23 DIAGNOSIS — E785 Hyperlipidemia, unspecified: Secondary | ICD-10-CM

## 2020-03-23 DIAGNOSIS — E039 Hypothyroidism, unspecified: Secondary | ICD-10-CM

## 2020-03-23 DIAGNOSIS — E663 Overweight: Secondary | ICD-10-CM

## 2020-03-23 DIAGNOSIS — Z8673 Personal history of transient ischemic attack (TIA), and cerebral infarction without residual deficits: Secondary | ICD-10-CM | POA: Diagnosis not present

## 2020-03-23 DIAGNOSIS — I10 Essential (primary) hypertension: Secondary | ICD-10-CM | POA: Diagnosis not present

## 2020-03-23 DIAGNOSIS — Z6829 Body mass index (BMI) 29.0-29.9, adult: Secondary | ICD-10-CM

## 2020-03-23 MED ORDER — CLOPIDOGREL BISULFATE 75 MG PO TABS: 75.0000 mg | ORAL_TABLET | Freq: Every day | ORAL | 2 refills | Status: DC

## 2020-03-23 MED ORDER — LEVOTHYROXINE SODIUM 50 MCG PO TABS: 50.0000 ug | ORAL_TABLET | Freq: Every day | ORAL | 2 refills | Status: DC

## 2020-03-23 MED ORDER — ATORVASTATIN CALCIUM 40 MG PO TABS: 40.0000 mg | ORAL_TABLET | Freq: Every morning | ORAL | 2 refills | Status: DC

## 2020-03-23 NOTE — Patient Instructions (Signed)
I appreciate the opportunity to provide you with care for your health and wellness. Today we discussed: establish care   Follow up: 3 months BP   Labs in next few days to week-fasting No referrals today  Great to meet you today:)  Enjoy the Summer see you in the Fall.   Please continue to practice social distancing to keep you, your family, and our community safe.  If you must go out, please wear a mask and practice good handwashing.  It was a pleasure to see you and I look forward to continuing to work together on your health and well-being. Please do not hesitate to call the office if you need care or have questions about your care.  Have a wonderful day and week. With Gratitude, Tereasa Coop, DNP, AGNP-BC

## 2020-03-23 NOTE — Progress Notes (Signed)
Subjective:  Patient ID: Alexis Ross, female    DOB: 1945-11-12  Age: 74 y.o. MRN: [id number]  CC:  Chief Complaint  Patient presents with  . New Patient (Initial Visit)    new pt former dr Juanetta Gosling pt everything is going good will send refills on meds      HPI  HPI   Alexis Ross is a 74 year old female patient previously a patient of Dr. Juanetta Gosling.  Presents today to establish care.  Has a significant history that includes but not limited to hypertension, hypothyroidism, hyperlipidemia, overdue for weight, history of CVA.  She is taking Plavix and aspirin regularly for her history of CVA.  She does not have any signs or symptoms of bleeding that she needs to discuss today.   Denies having any sleep trouble.  Denies having any trouble with eating or chewing though she is missing teeth.  Denies having any trouble with appetite.  Denies having any incontinence issues.  Denies have any changes in bowel or bladder habits that she was last seen.  Denies having any blood in her urine or stool.  Denies having any recent falls.  Denies having any memory changes.  Denies having any skin issues or concerns to discuss today.  Reports taking her medications without any issues or concerns.  Does need refills on them as stated above.  She denies having any chest pain, shortness of breath, cough, leg swelling, palpitations, headaches, vision changes, dizziness.  She is a widow.  Former smoker quit back in 2015.  Lives alone.  Does have 4 children.  Enjoys going to church, relaxing, reading and watching TV.  Eats all food groups.  Does drink caffeine 1 to 2 cups a day.  Wears her seatbelt but does not drive herself.  Has smoke detectors and no weapons in the home.  She reports having good/support system if she were to need anything.  She is unsure if she has been tested for hepatitis C.  She thinks her last pneumonia was in 2013 as per the chart.  Is unsure when her last tetanus was.  Last bone scan was  in 2018.  She is unsure when her last colonoscopy was.  Today patient denies signs and symptoms of COVID 19 infection including fever, chills, cough, shortness of breath, and headache. Past Medical, Surgical, Social History, Allergies, and Medications have been Reviewed.   Past Medical History:  Diagnosis Date  . Accidental fall 12/19/2013   "tripped over pallet in store; briefly lost consciousness" (12/20/2013)  . Anemia   . Arthritis    "knees, fingers" (12/19/2013)  . Closed fracture of facial bones (HCC) 12/19/2013  . Concussion with brief LOC 12/19/2013  . Head injury, closed, with concussion 12/19/2013  . Hyperlipidemia   . Hypothyroidism   . Stroke [facility]) 12/2011   residual "left sided weakness" (12/19/2013)    Current Meds  Medication Sig  . acetaminophen (TYLENOL) 500 MG tablet Take 1,000 mg by mouth every 6 (six) hours as needed. For pain  . aspirin 325 MG tablet Take 325 mg by mouth daily.  . [DISCONTINUED] atorvastatin (LIPITOR) 40 MG tablet Take 40 mg by mouth every morning.  . [DISCONTINUED] clopidogrel (PLAVIX) 75 MG tablet Take 75 mg by mouth daily with breakfast.  . [DISCONTINUED] levothyroxine (SYNTHROID, LEVOTHROID) 50 MCG tablet Take 50 mcg by mouth daily before breakfast.    ROS:  Review of Systems  HENT: Negative.   Eyes: Negative.   Respiratory: Negative.  Cardiovascular: Negative.   Gastrointestinal: Negative.   Genitourinary: Negative.   Musculoskeletal: Negative.   Skin: Negative.   Neurological: Negative.   Endo/Heme/Allergies: Negative.   Psychiatric/Behavioral: Negative.   All other systems reviewed and are negative.    Objective:   Today's Vitals: BP 140/88   Pulse 80   Temp 98 F (36.7 C) (Temporal)   Ht 5\' 7"  (1.702 m)   Wt 189 lb (85.7 kg)   SpO2 98%   BMI 29.60 kg/m  Vitals with BMI 03/23/2020 03/23/2020 01/04/2016  Height - 5\' 7"  -  Weight - 189 lbs -  BMI - 60.45 -  Systolic [phone]  Diastolic 88 84 57  Pulse - 80 89     Physical  Exam Vitals and nursing note reviewed.  Constitutional:      Appearance: Normal appearance. She is well-developed, well-groomed and overweight.  HENT:     Head: Normocephalic and atraumatic.     Right Ear: External ear normal.     Left Ear: External ear normal.     Mouth/Throat:     Comments: Mask in place  Eyes:     General:        Right eye: No discharge.        Left eye: No discharge.     Conjunctiva/sclera: Conjunctivae normal.  Cardiovascular:     Rate and Rhythm: Normal rate and regular rhythm.     Pulses: Normal pulses.     Heart sounds: Normal heart sounds.  Pulmonary:     Effort: Pulmonary effort is normal.     Breath sounds: Normal breath sounds.  Musculoskeletal:        General: Normal range of motion.     Cervical back: Normal range of motion and neck supple.  Skin:    General: Skin is warm.  Neurological:     General: No focal deficit present.     Mental Status: She is alert and oriented to person, place, and time.  Psychiatric:        Attention and Perception: Attention normal.        Mood and Affect: Mood normal.        Speech: Speech normal.        Behavior: Behavior normal. Behavior is cooperative.        Thought Content: Thought content normal.        Cognition and Memory: Cognition normal.        Judgment: Judgment normal.     Comments: Very pleasant, good eye contact.      Assessment   1. Essential hypertension   2. Hypothyroidism, unspecified type   3. Hyperlipidemia, unspecified hyperlipidemia type   4. Overweight with body mass index (BMI) of 29 to 29.9 in adult   5. History of CVA (cerebrovascular accident)     Tests ordered Orders Placed This Encounter  Procedures  . CBC  . COMPLETE METABOLIC PANEL WITH GFR  . TSH  . Lipid panel     Plan: Please see assessment and plan per problem list above.   No orders of the defined types were placed in this encounter.   Patient to follow-up in 3 month.  Perlie Mayo, NP

## 2020-03-24 DIAGNOSIS — I10 Essential (primary) hypertension: Secondary | ICD-10-CM | POA: Diagnosis not present

## 2020-03-24 DIAGNOSIS — E785 Hyperlipidemia, unspecified: Secondary | ICD-10-CM | POA: Diagnosis not present

## 2020-03-24 DIAGNOSIS — E039 Hypothyroidism, unspecified: Secondary | ICD-10-CM | POA: Diagnosis not present

## 2020-03-24 LAB — COMPLETE METABOLIC PANEL WITH GFR
AG Ratio: 1.7 (calc) (ref 1.0–2.5)
ALT: 12 U/L (ref 6–29)
AST: 15 U/L (ref 10–35)
Albumin: 4.2 g/dL (ref 3.6–5.1)
Alkaline phosphatase (APISO): 73 U/L (ref 37–153)
BUN: 11 mg/dL (ref 7–25)
CO2: 28 mmol/L (ref 20–32)
Calcium: 9.7 mg/dL (ref 8.6–10.4)
Chloride: 108 mmol/L (ref 98–110)
Creat: 0.89 mg/dL (ref 0.60–0.93)
GFR, Est African American: 73 mL/min/{1.73_m2} (ref >=60)
GFR, Est Non African American: 63 mL/min/{1.73_m2} (ref >=60)
Globulin: 2.5 g/dL (calc) (ref 1.9–3.7)
Glucose, Bld: 93 mg/dL (ref 65–99)
Potassium: 4.2 mmol/L (ref 3.5–5.3)
Sodium: 142 mmol/L (ref 135–146)
Total Bilirubin: 0.6 mg/dL (ref 0.2–1.2)
Total Protein: 6.7 g/dL (ref 6.1–8.1)

## 2020-03-24 LAB — CBC
HCT: 37.9 % (ref 35.0–45.0)
Hemoglobin: 12.1 g/dL (ref 11.7–15.5)
MCH: 28.3 pg (ref 27.0–33.0)
MCHC: 31.9 g/dL — ABNORMAL LOW (ref 32.0–36.0)
MCV: 88.6 fL (ref 80.0–100.0)
MPV: 10.3 fL (ref 7.5–12.5)
Platelets: 266 10*3/uL (ref 140–400)
RBC: 4.28 10*6/uL (ref 3.80–5.10)
RDW: 12.9 % (ref 11.0–15.0)
WBC: 6.5 10*3/uL (ref 3.8–10.8)

## 2020-03-24 LAB — TSH: TSH: 0.16 mIU/L — ABNORMAL LOW (ref 0.40–4.50)

## 2020-03-24 LAB — LIPID PANEL
Cholesterol: 186 mg/dL (ref <200)
HDL: 60 mg/dL (ref >=50)
LDL Cholesterol (Calc): 106 mg/dL (calc) — ABNORMAL HIGH
Non-HDL Cholesterol (Calc): 126 mg/dL (calc) (ref <130)
Total CHOL/HDL Ratio: 3.1 (calc) (ref <5.0)
Triglycerides: 103 mg/dL (ref <150)

## 2020-03-25 ENCOUNTER — Other Ambulatory Visit: Payer: Self-pay | Admitting: Family Medicine

## 2020-03-25 DIAGNOSIS — E039 Hypothyroidism, unspecified: Secondary | ICD-10-CM

## 2020-03-25 MED ORDER — LEVOTHYROXINE SODIUM 25 MCG PO TABS: 25.0000 ug | ORAL_TABLET | Freq: Every day | ORAL | 2 refills | Status: DC

## 2020-03-31 ENCOUNTER — Encounter: Payer: Self-pay | Admitting: Family Medicine

## 2020-03-31 DIAGNOSIS — E039 Hypothyroidism, unspecified: Secondary | ICD-10-CM | POA: Insufficient documentation

## 2020-03-31 DIAGNOSIS — Z8673 Personal history of transient ischemic attack (TIA), and cerebral infarction without residual deficits: Secondary | ICD-10-CM | POA: Insufficient documentation

## 2020-03-31 DIAGNOSIS — E663 Overweight: Secondary | ICD-10-CM | POA: Insufficient documentation

## 2020-03-31 DIAGNOSIS — I10 Essential (primary) hypertension: Secondary | ICD-10-CM | POA: Insufficient documentation

## 2020-03-31 DIAGNOSIS — E785 Hyperlipidemia, unspecified: Secondary | ICD-10-CM | POA: Insufficient documentation

## 2020-03-31 NOTE — Assessment & Plan Note (Signed)
Updated labs ordered no signs or symptoms of hypothyroidism today.

## 2020-03-31 NOTE — Assessment & Plan Note (Signed)
Educated on lifestyle changes. Encourage exercise  Wt Readings from Last 3 Encounters:  03/23/20 189 lb (85.7 kg)  01/03/16 185 lb (83.9 kg)  12/19/13 185 lb 10 oz (84.2 kg)

## 2020-03-31 NOTE — Assessment & Plan Note (Addendum)
On Lipitor reports doing well with this without muscle aches or pains. Updated labs in the near future. Encouraged heart healthy low-fat diet.

## 2020-03-31 NOTE — Assessment & Plan Note (Signed)
Alexis Ross is encouraged to maintain a well balanced diet that is low in salt. Not well controlled, she reports being nervous will reassess at future appts   Additionally, she is also reminded that exercise is beneficial for heart health and control of  Blood pressure. 30-60 minutes daily is recommended-walking was suggested.

## 2020-03-31 NOTE — Assessment & Plan Note (Signed)
Is on Plavix and aspirin.  Denies having any signs or symptoms of stroke and or bleeding from blood thinner.

## 2020-06-23 ENCOUNTER — Ambulatory Visit (INDEPENDENT_AMBULATORY_CARE_PROVIDER_SITE_OTHER): Payer: Medicare Other | Admitting: Family Medicine

## 2020-06-23 ENCOUNTER — Other Ambulatory Visit: Payer: Self-pay | Admitting: *Deleted

## 2020-06-23 ENCOUNTER — Encounter: Payer: Self-pay | Admitting: Family Medicine

## 2020-06-23 ENCOUNTER — Other Ambulatory Visit: Payer: Self-pay

## 2020-06-23 VITALS — BP 146/82 | HR 63 | Temp 97.2°F | Resp 18 | Ht 66.0 in | Wt 194.4 lb

## 2020-06-23 DIAGNOSIS — I10 Essential (primary) hypertension: Secondary | ICD-10-CM

## 2020-06-23 DIAGNOSIS — Z23 Encounter for immunization: Secondary | ICD-10-CM

## 2020-06-23 DIAGNOSIS — E039 Hypothyroidism, unspecified: Secondary | ICD-10-CM | POA: Diagnosis not present

## 2020-06-23 NOTE — Assessment & Plan Note (Signed)

## 2020-06-23 NOTE — Patient Instructions (Signed)
I appreciate the opportunity to provide you with care for your health and wellness. Today we discussed: BP and Thyroid  Follow up: 4 weeks can be by phone or in office for BP readings   Labs today No referrals today  Once we see your labs I can see if you need a change in your thyroid medication.  Please have BP taken daily for next 4 weeks we will review at your next visit to see if you need anything for this.  Please continue to practice social distancing to keep you, your family, and our community safe.  If you must go out, please wear a mask and practice good handwashing.  It was a pleasure to see you and I look forward to continuing to work together on your health and well-being. Please do not hesitate to call the office if you need care or have questions about your care.  Have a wonderful day and week. With Gratitude, Tereasa Coop, DNP, AGNP-BC

## 2020-06-23 NOTE — Progress Notes (Signed)
Subjective:  Patient ID: Alexis Ross, female    DOB: [birth year]  Age: 74 y.o. MRN: [id number]  CC:  Chief Complaint  Patient presents with  . Follow-up    3 month follow up bp everything is going well right now       HPI  HPI Alexis Ross is a 74 year old female patient of mine.  She presents today for follow-up on blood pressure and hypothyroidism.  I had adjusted her medications secondary to her thyroid level being low.  She reports taking them as directed and not having any issues.  She denies have any signs or symptoms of hypothyroidism today in the office.  She denies having any signs or symptoms of hyperthyroidism today in the office.  She reports tolerating the medications well.  Blood pressure is still elevated she did not take her blood pressure at home.  She thinks is just because she is here in the office.  But we do not have a record of what her home readings could be.  She is willing to have her blood pressure taken by a family member and then report back the results to see if she needs to be on anything.  Denies having any chest pain, cough, shortness of breath, palpitations, leg swelling, dizziness, headaches, vision changes, hair loss, constipation, intolerance to cold.  Today patient denies signs and symptoms of COVID 19 infection including fever, chills, cough, shortness of breath, and headache. Past Medical, Surgical, Social History, Allergies, and Medications have been Reviewed.   Past Medical History:  Diagnosis Date  . Accidental fall 12/19/2013   "tripped over pallet in store; briefly lost consciousness" (12/20/2013)  . Anemia   . Arthritis    "knees, fingers" (12/19/2013)  . Closed fracture of facial bones (HCC) 12/19/2013  . Concussion with brief LOC 12/19/2013  . Head injury, closed, with concussion 12/19/2013  . Hyperlipidemia   . Hypothyroidism   . Stroke [facility]) 12/2011   residual "left sided weakness" (12/19/2013)    Current Meds  Medication Sig  .  acetaminophen (TYLENOL) 500 MG tablet Take 1,000 mg by mouth every 6 (six) hours as needed. For pain  . aspirin 325 MG tablet Take 325 mg by mouth daily.  Marland Kitchen atorvastatin (LIPITOR) 40 MG tablet Take 1 tablet (40 mg total) by mouth every morning.  . clopidogrel (PLAVIX) 75 MG tablet Take 1 tablet (75 mg total) by mouth daily with breakfast.  . levothyroxine (SYNTHROID) 25 MCG tablet Take 1 tablet (25 mcg total) by mouth daily before breakfast.  . [DISCONTINUED] EUTHYROX 100 MCG tablet Take 100 mcg by mouth daily.    ROS:  Review of Systems  Constitutional: Negative.   HENT: Negative.   Eyes: Negative.   Respiratory: Negative.   Cardiovascular: Negative.   Gastrointestinal: Negative.   Genitourinary: Negative.   Musculoskeletal: Negative.   Skin: Negative.   Neurological: Negative.   Endo/Heme/Allergies: Negative.   Psychiatric/Behavioral: Negative.      Objective:   Today's Vitals: BP (!) 146/82 (BP Location: Right Arm, Patient Position: Sitting, Cuff Size: Normal)   Pulse 63   Temp (!) 97.2 F (36.2 C) (Temporal)   Resp 18   Ht 5\' 6"  (1.676 m)   Wt 194 lb 6.4 oz (88.2 kg)   SpO2 99%   BMI 31.38 kg/m  Vitals with BMI 06/23/2020 03/23/2020 03/23/2020  Height 5\' 6"  - 5\' 7"   Weight 194 lbs 6 oz - 189 lbs  BMI 31.39 - 29.59  Systolic  146 140 158  Diastolic 82 88 84  Pulse 63 - 80     Physical Exam Vitals and nursing note reviewed.  Constitutional:      Appearance: Normal appearance. She is well-developed and well-groomed. She is obese.  HENT:     Head: Normocephalic and atraumatic.     Right Ear: External ear normal.     Left Ear: External ear normal.     Mouth/Throat:     Comments: Mask in place  Eyes:     General:        Right eye: No discharge.        Left eye: No discharge.     Conjunctiva/sclera: Conjunctivae normal.  Cardiovascular:     Rate and Rhythm: Normal rate and regular rhythm.     Pulses: Normal pulses.     Heart sounds: Normal heart sounds.    Pulmonary:     Effort: Pulmonary effort is normal.     Breath sounds: Normal breath sounds.  Musculoskeletal:        General: Normal range of motion.     Cervical back: Normal range of motion and neck supple.  Skin:    General: Skin is warm.  Neurological:     General: No focal deficit present.     Mental Status: She is alert and oriented to person, place, and time.  Psychiatric:        Attention and Perception: Attention normal.        Mood and Affect: Mood normal.        Speech: Speech normal.        Behavior: Behavior normal. Behavior is cooperative.        Thought Content: Thought content normal.        Cognition and Memory: Cognition normal.        Judgment: Judgment normal.    Assessment   1. Essential hypertension   2. Hypothyroidism, unspecified type   3. Need for immunization against influenza     Tests ordered Orders Placed This Encounter  Procedures  . Flu Vaccine QUAD High Dose(Fluad)  . TSH  . Comprehensive metabolic panel  . CBC     Plan: Please see assessment and plan per problem list above.   No orders of the defined types were placed in this encounter.   Patient to follow-up in 4 weeks for BP readings  Freddy Finner, NP

## 2020-06-23 NOTE — Assessment & Plan Note (Signed)
She needs updated labs as her last thyroid level checked back in June was low.  Medication adjustment was made in.  Pending level today we will continue or adjust medication as needed. She denies having any signs or symptoms of hypo or hyperthyroidism.

## 2020-06-23 NOTE — Assessment & Plan Note (Signed)
Continues to have an elevated blood pressure today in the office.  Reports she does not check her blood pressure at home.  But she does have some me that can help get a blood pressure check.  She is encouraged to do a DASH diet keep a record of her blood pressures and will do a follow-up appointment to see if she does need to be on something low-dose for blood pressure. Patient acknowledged agreement and understanding of the plan.

## 2020-06-24 ENCOUNTER — Other Ambulatory Visit: Payer: Self-pay | Admitting: Family Medicine

## 2020-06-24 ENCOUNTER — Other Ambulatory Visit: Payer: Self-pay | Admitting: *Deleted

## 2020-06-24 DIAGNOSIS — E039 Hypothyroidism, unspecified: Secondary | ICD-10-CM

## 2020-06-24 LAB — COMPREHENSIVE METABOLIC PANEL
ALT: 13 IU/L (ref 0–32)
AST: 17 IU/L (ref 0–40)
Albumin/Globulin Ratio: 2 (ref 1.2–2.2)
Albumin: 4.8 g/dL — ABNORMAL HIGH (ref 3.7–4.7)
Alkaline Phosphatase: 96 IU/L (ref 48–121)
BUN/Creatinine Ratio: 8 — ABNORMAL LOW (ref 12–28)
BUN: 8 mg/dL (ref 8–27)
Bilirubin Total: 0.4 mg/dL (ref 0.0–1.2)
CO2: 25 mmol/L (ref 20–29)
Calcium: 9.4 mg/dL (ref 8.7–10.3)
Chloride: 103 mmol/L (ref 96–106)
Creatinine, Ser: 1.02 mg/dL — ABNORMAL HIGH (ref 0.57–1.00)
GFR calc Af Amer: 62 mL/min/{1.73_m2} (ref >59)
GFR calc non Af Amer: 54 mL/min/{1.73_m2} — ABNORMAL LOW (ref >59)
Globulin, Total: 2.4 g/dL (ref 1.5–4.5)
Glucose: 88 mg/dL (ref 65–99)
Potassium: 4.5 mmol/L (ref 3.5–5.2)
Sodium: 141 mmol/L (ref 134–144)
Total Protein: 7.2 g/dL (ref 6.0–8.5)

## 2020-06-24 LAB — CBC
Hematocrit: 38.2 % (ref 34.0–46.6)
Hemoglobin: 12.7 g/dL (ref 11.1–15.9)
MCH: 29.7 pg (ref 26.6–33.0)
MCHC: 33.2 g/dL (ref 31.5–35.7)
MCV: 90 fL (ref 79–97)
Platelets: 245 10*3/uL (ref 150–450)
RBC: 4.27 x10E6/uL (ref 3.77–5.28)
RDW: 14.4 % (ref 11.7–15.4)
WBC: 7.8 10*3/uL (ref 3.4–10.8)

## 2020-06-24 LAB — TSH: TSH: 40.4 u[IU]/mL — ABNORMAL HIGH (ref 0.450–4.500)

## 2020-06-24 MED ORDER — LEVOTHYROXINE SODIUM 50 MCG PO TABS: 50.0000 ug | ORAL_TABLET | Freq: Every day | ORAL | 1 refills | Status: DC

## 2020-07-21 ENCOUNTER — Other Ambulatory Visit: Payer: Self-pay

## 2020-07-21 ENCOUNTER — Encounter: Payer: Self-pay | Admitting: Family Medicine

## 2020-07-21 ENCOUNTER — Telehealth (INDEPENDENT_AMBULATORY_CARE_PROVIDER_SITE_OTHER): Payer: Medicare Other | Admitting: Family Medicine

## 2020-07-21 DIAGNOSIS — I10 Essential (primary) hypertension: Secondary | ICD-10-CM | POA: Diagnosis not present

## 2020-07-21 MED ORDER — LISINOPRIL 5 MG PO TABS: 5.0000 mg | ORAL_TABLET | Freq: Every day | ORAL | 1 refills | Status: DC

## 2020-07-21 NOTE — Assessment & Plan Note (Addendum)
Continue to have mildly elevated BP readings at home SBP 146-150/80's Additionally she has had a CVA in the past. Would like to start Lisinopril low dose, as I do not want a drastic drop, but a better control continuously She is agreeable to this. We will do 5mg  and follow up with readings in the office. She is encouraged to continue DASH diet and exercise as tolerated. Reviewed side effects, risks and benefits of medication.   She is advised if she notes to feel poorly or different to call the office and not wait until her appt. Patient acknowledged agreement and understanding of the plan.

## 2020-07-21 NOTE — Patient Instructions (Signed)
°  HAPPY FALL!  I appreciate the opportunity to provide you with care for your health and wellness. Today we discussed: BP   Follow up: 6 weeks in office with BP readings   No labs or referrals today  Please start low dose lisinopril daily. Call if you have any concerns or problems. Continue to check BP readings and bring with you to the office visit.  Please continue to practice social distancing to keep you, your family, and our community safe.  If you must go out, please wear a mask and practice good handwashing.  It was a pleasure to see you and I look forward to continuing to work together on your health and well-being. Please do not hesitate to call the office if you need care or have questions about your care.  Have a wonderful day and week. With Gratitude, Tereasa Coop, DNP, AGNP-BC

## 2020-07-21 NOTE — Progress Notes (Signed)
Virtual Visit via Telephone Note   This visit type was conducted due to national recommendations for restrictions regarding the COVID-19 Pandemic (e.g. social distancing) in an effort to limit this patient's exposure and mitigate transmission in our community.  Due to her co-morbid illnesses, this patient is at least at moderate risk for complications without adequate follow up.  This format is felt to be most appropriate for this patient at this time.  The patient did not have access to video technology/had technical difficulties with video requiring transitioning to audio format only (telephone).  All issues noted in this document were discussed and addressed.  No physical exam could be performed with this format.    Evaluation Performed:  Follow-up visit  Date:  07/21/2020   ID:  Alexis, Ross [birth year], MRN [id number]  Patient Location: Home Provider Location: Office/Clinic  Location of Patient: Home Location of Provider: Telehealth Consent was obtain for visit to be over via telehealth. I verified that I am speaking with the correct person using two identifiers.  PCP:  Freddy Finner, NP   Chief Complaint:  BP   History of Present Illness:    Alexis Ross is a 74 y.o. female with history as stated below. Presents today for follow up on BP readings.  She reports she forgot to write down the BP's but this morning it was 146/82. She reports she usually saw numbers 146-150's over 80's She denies having any chest pain, headaches, leg swelling, shortness of breath, cough, dizziness, or blurred vision. She has not been on BP medication in some time. She has a history of CVA in 2013. She is agreeable to start something low dose.  The patient does not have symptoms concerning for COVID-19 infection (fever, chills, cough, or new shortness of breath).   Past Medical, Surgical, Social History, Allergies, and Medications have been Reviewed.  Past Medical History:    Diagnosis Date  . Accidental fall 12/19/2013   "tripped over pallet in store; briefly lost consciousness" (12/20/2013)  . Anemia   . Arthritis    "knees, fingers" (12/19/2013)  . Closed fracture of facial bones (HCC) 12/19/2013  . Concussion with brief LOC 12/19/2013  . Head injury, closed, with concussion 12/19/2013  . Hyperlipidemia   . Hypothyroidism   . Stroke [facility]) 12/2011   residual "left sided weakness" (12/19/2013)   Past Surgical History:  Procedure Laterality Date  . NO PAST SURGERIES       Current Meds  Medication Sig  . acetaminophen (TYLENOL) 500 MG tablet Take 1,000 mg by mouth every 6 (six) hours as needed. For pain  . aspirin 325 MG tablet Take 325 mg by mouth daily.  Marland Kitchen atorvastatin (LIPITOR) 40 MG tablet Take 1 tablet (40 mg total) by mouth every morning.  . clopidogrel (PLAVIX) 75 MG tablet Take 1 tablet (75 mg total) by mouth daily with breakfast.  . levothyroxine (SYNTHROID) 50 MCG tablet Take 1 tablet (50 mcg total) by mouth daily before breakfast.     Allergies:   Patient has no known allergies.   ROS:   Please see the history of present illness.    All other systems reviewed and are negative.   Labs/Other Tests and Data Reviewed:    Recent Labs: 06/23/2020: ALT 13; BUN 8; Creatinine, Ser 1.02; Hemoglobin 12.7; Platelets 245; Potassium 4.5; Sodium 141; TSH 40.400   Recent Lipid Panel Lab Results  Component Value Date/Time   CHOL 186 03/24/2020 08:25 AM  TRIG 103 03/24/2020 08:25 AM   HDL 60 03/24/2020 08:25 AM   CHOLHDL 3.1 03/24/2020 08:25 AM   LDLCALC 106 (H) 03/24/2020 08:25 AM    Wt Readings from Last 3 Encounters:  06/23/20 194 lb 6.4 oz (88.2 kg)  03/23/20 189 lb (85.7 kg)  01/03/16 185 lb (83.9 kg)     Objective:    Vital Signs:  There were no vitals taken for this visit.   VITAL SIGNS:  reviewed GEN:  no acute distress RESPIRATORY:  no shortness of breath in conversation  PSYCH:  normal affect and mood   ASSESSMENT & PLAN:    1.  Essential hypertension   Time:   Today, I have spent 7 minutes with the patient with telehealth technology discussing the above problems.     Medication Adjustments/Labs and Tests Ordered: Current medicines are reviewed at length with the patient today.  Concerns regarding medicines are outlined above.   Tests Ordered: No orders of the defined types were placed in this encounter.   Medication Changes: No orders of the defined types were placed in this encounter.   Disposition:  Follow up 6 weeks in office  Signed, Freddy Finner, NP  07/21/2020 10:15 AM     [facility]

## 2020-08-05 DIAGNOSIS — E039 Hypothyroidism, unspecified: Secondary | ICD-10-CM | POA: Diagnosis not present

## 2020-08-07 LAB — TSH: TSH: 17.8 u[IU]/mL — ABNORMAL HIGH (ref 0.450–4.500)

## 2020-08-10 ENCOUNTER — Telehealth (INDEPENDENT_AMBULATORY_CARE_PROVIDER_SITE_OTHER): Payer: Medicare Other

## 2020-08-10 ENCOUNTER — Other Ambulatory Visit: Payer: Self-pay

## 2020-08-10 VITALS — BP 146/82 | HR 63 | Temp 97.2°F | Resp 18 | Ht 66.0 in | Wt 194.0 lb

## 2020-08-10 DIAGNOSIS — Z0000 Encounter for general adult medical examination without abnormal findings: Secondary | ICD-10-CM

## 2020-08-10 NOTE — Patient Instructions (Addendum)
[name] , Thank you for taking time to come for your Medicare Wellness Visit. I appreciate your ongoing commitment to your health goals. Please review the following plan we discussed and let me know if I can assist you in the future.   Screening recommendations/referrals: Colonoscopy: No longer required.  Mammogram: No longer required Bone Density: Completed   Recommended yearly ophthalmology/optometry visit for glaucoma screening and checkup Recommended yearly dental visit for hygiene and checkup  Vaccinations: Influenza vaccine: Complete Pneumococcal vaccine: Complete  Tdap vaccine: Complete  Shingles vaccine: Education provided.     Advanced directives: N/A   Conditions/risks identified: None   Next appointment: 09/01/2020 @ 11:00 am with Alexis Coop, NP    Preventive Care 76 Years and Older, Female Preventive care refers to lifestyle choices and visits with your health care provider that can promote health and wellness. What does preventive care include?  A yearly physical exam. This is also called an annual well check.  Dental exams once or twice a year.  Routine eye exams. Ask your health care provider how often you should have your eyes checked.  Personal lifestyle choices, including:  Daily care of your teeth and gums.  Regular physical activity.  Eating a healthy diet.  Avoiding tobacco and drug use.  Limiting alcohol use.  Practicing safe sex.  Taking low-dose aspirin every day.  Taking vitamin and mineral supplements as recommended by your health care provider. What happens during an annual well check? The services and screenings done by your health care provider during your annual well check will depend on your age, overall health, lifestyle risk factors, and family history of disease. Counseling  Your health care provider may ask you questions about your:  Alcohol use.  Tobacco use.  Drug use.  Emotional well-being.  Home and  relationship well-being.  Sexual activity.  Eating habits.  History of falls.  Memory and ability to understand (cognition).  Work and work Astronomer.  Reproductive health. Screening  You may have the following tests or measurements:  Height, weight, and BMI.  Blood pressure.  Lipid and cholesterol levels. These may be checked every 5 years, or more frequently if you are over 30 years old.  Skin check.  Lung cancer screening. You may have this screening every year starting at age 62 if you have a 30-pack-year history of smoking and currently smoke or have quit within the past 15 years.  Fecal occult blood test (FOBT) of the stool. You may have this test every year starting at age 4.  Flexible sigmoidoscopy or colonoscopy. You may have a sigmoidoscopy every 5 years or a colonoscopy every 10 years starting at age 15.  Hepatitis C blood test.  Hepatitis B blood test.  Sexually transmitted disease (STD) testing.  Diabetes screening. This is done by checking your blood sugar (glucose) after you have not eaten for a while (fasting). You may have this done every 1-3 years.  Bone density scan. This is done to screen for osteoporosis. You may have this done starting at age 62.  Mammogram. This may be done every 1-2 years. Talk to your health care provider about how often you should have regular mammograms. Talk with your health care provider about your test results, treatment options, and if necessary, the need for more tests. Vaccines  Your health care provider may recommend certain vaccines, such as:  Influenza vaccine. This is recommended every year.  Tetanus, diphtheria, and acellular pertussis (Tdap, Td) vaccine. You may need a Td  booster every 10 years.  Zoster vaccine. You may need this after age 59.  Pneumococcal 13-valent conjugate (PCV13) vaccine. One dose is recommended after age 22.  Pneumococcal polysaccharide (PPSV23) vaccine. One dose is recommended after  age 35. Talk to your health care provider about which screenings and vaccines you need and how often you need them. This information is not intended to replace advice given to you by your health care provider. Make sure you discuss any questions you have with your health care provider. Document Released: 10/29/2015 Document Revised: 06/21/2016 Document Reviewed: 08/03/2015 Elsevier Interactive Patient Education  2017 [employer] Prevention in the Home Falls can cause injuries. They can happen to people of all ages. There are many things you can do to make your home safe and to help prevent falls. What can I do on the outside of my home?  Regularly fix the edges of walkways and driveways and fix any cracks.  Remove anything that might make you trip as you walk through a door, such as a raised step or threshold.  Trim any bushes or trees on the path to your home.  Use bright outdoor lighting.  Clear any walking paths of anything that might make someone trip, such as rocks or tools.  Regularly check to see if handrails are loose or broken. Make sure that both sides of any steps have handrails.  Any raised decks and porches should have guardrails on the edges.  Have any leaves, snow, or ice cleared regularly.  Use sand or salt on walking paths during winter.  Clean up any spills in your garage right away. This includes oil or grease spills. What can I do in the bathroom?  Use night lights.  Install grab bars by the toilet and in the tub and shower. Do not use towel bars as grab bars.  Use non-skid mats or decals in the tub or shower.  If you need to sit down in the shower, use a plastic, non-slip stool.  Keep the floor dry. Clean up any water that spills on the floor as soon as it happens.  Remove soap buildup in the tub or shower regularly.  Attach bath mats securely with double-sided non-slip rug tape.  Do not have throw rugs and other things on the floor that can  make you trip. What can I do in the bedroom?  Use night lights.  Make sure that you have a light by your bed that is easy to reach.  Do not use any sheets or blankets that are too big for your bed. They should not hang down onto the floor.  Have a firm chair that has side arms. You can use this for support while you get dressed.  Do not have throw rugs and other things on the floor that can make you trip. What can I do in the kitchen?  Clean up any spills right away.  Avoid walking on wet floors.  Keep items that you use a lot in easy-to-reach places.  If you need to reach something above you, use a strong step stool that has a grab bar.  Keep electrical cords out of the way.  Do not use floor polish or wax that makes floors slippery. If you must use wax, use non-skid floor wax.  Do not have throw rugs and other things on the floor that can make you trip. What can I do with my stairs?  Do not leave any items on the stairs.  Make sure that there are handrails on both sides of the stairs and use them. Fix handrails that are broken or loose. Make sure that handrails are as long as the stairways.  Check any carpeting to make sure that it is firmly attached to the stairs. Fix any carpet that is loose or worn.  Avoid having throw rugs at the top or bottom of the stairs. If you do have throw rugs, attach them to the floor with carpet tape.  Make sure that you have a light switch at the top of the stairs and the bottom of the stairs. If you do not have them, ask someone to add them for you. What else can I do to help prevent falls?  Wear shoes that:  Do not have high heels.  Have rubber bottoms.  Are comfortable and fit you well.  Are closed at the toe. Do not wear sandals.  If you use a stepladder:  Make sure that it is fully opened. Do not climb a closed stepladder.  Make sure that both sides of the stepladder are locked into place.  Ask someone to hold it for you,  if possible.  Clearly mark and make sure that you can see:  Any grab bars or handrails.  First and last steps.  Where the edge of each step is.  Use tools that help you move around (mobility aids) if they are needed. These include:  Canes.  Walkers.  Scooters.  Crutches.  Turn on the lights when you go into a dark area. Replace any light bulbs as soon as they burn out.  Set up your furniture so you have a clear path. Avoid moving your furniture around.  If any of your floors are uneven, fix them.  If there are any pets around you, be aware of where they are.  Review your medicines with your doctor. Some medicines can make you feel dizzy. This can increase your chance of falling. Ask your doctor what other things that you can do to help prevent falls. This information is not intended to replace advice given to you by your health care provider. Make sure you discuss any questions you have with your health care provider. Document Released: 07/29/2009 Document Revised: 03/09/2016 Document Reviewed: 11/06/2014 Elsevier Interactive Patient Education  2017 [employer].

## 2020-08-10 NOTE — Progress Notes (Signed)
Subjective:   Alexis Ross is a 74 y.o. female who presents for Medicare Annual (Subsequent) preventive examination.        Objective:    Today's Vitals   08/10/20 1359 08/10/20 1400  BP: (!) 146/82   Pulse: 63   Resp: 18   Temp: (!) 97.2 F (36.2 C)   Weight: 194 lb (88 kg)   Height: 5\' 6"  (1.676 m)   PainSc:  0-No pain   Body mass index is 31.31 kg/m.  Advanced Directives 01/03/2016 12/19/2013 12/24/2011  Does Patient Have a Medical Advance Directive? No Patient does not have advance directive;Patient would not like information Patient does not have advance directive;Patient would not like information  Would patient like information on creating a medical advance directive? No - patient declined information - -  Pre-existing out of facility DNR order (yellow form or pink MOST form) - - No    Current Medications (verified) Outpatient Encounter Medications as of 08/10/2020  Medication Sig   acetaminophen (TYLENOL) 500 MG tablet Take 1,000 mg by mouth every 6 (six) hours as needed. For pain   aspirin 325 MG tablet Take 325 mg by mouth daily.   atorvastatin (LIPITOR) 40 MG tablet Take 1 tablet (40 mg total) by mouth every morning.   clopidogrel (PLAVIX) 75 MG tablet Take 1 tablet (75 mg total) by mouth daily with breakfast.   levothyroxine (SYNTHROID) 50 MCG tablet Take 1 tablet (50 mcg total) by mouth daily before breakfast.   lisinopril (ZESTRIL) 5 MG tablet Take 1 tablet (5 mg total) by mouth daily.   No facility-administered encounter medications on file as of 08/10/2020.    Allergies (verified) Patient has no known allergies.   History: Past Medical History:  Diagnosis Date   Accidental fall 12/19/2013   "tripped over pallet in store; briefly lost consciousness" (12/20/2013)   Anemia    Arthritis    "knees, fingers" (12/19/2013)   Closed fracture of facial bones (HCC) 12/19/2013   Concussion with brief LOC 12/19/2013   Head injury, closed, with  concussion 12/19/2013   Hyperlipidemia    Hypothyroidism    Stroke (HCC) 12/2011   residual "left sided weakness" (12/19/2013)   Past Surgical History:  Procedure Laterality Date   NO PAST SURGERIES     No family history on file. Social History   Socioeconomic History   Marital status: Widowed    Spouse name: Not on file   Number of children: 4   Years of education: Not on file   Highest education level: 11th grade  Occupational History   Not on file  Tobacco Use   Smoking status: Former Smoker    Packs/day: 0.50    Years: 30.00    Pack years: 15.00    Types: Cigarettes   Smokeless tobacco: Never Used   Tobacco comment: 12/19/2013 "quit smoking in ~ 1994"  Substance and Sexual Activity   Alcohol use: Yes    Comment: 12/19/2013 "drank a little when I was young"   Drug use: No   Sexual activity: Yes  Other Topics Concern   Not on file  Social History Narrative   Lives alone   4 children: 02/18/2014       Enjoys: church, relaxing, read, watch tv      Diet: eats all foods groups   Caffeine: 1-2 cups coffee   Water: 6-8 cups daily      Wears seat belt-does not drive    Smoke detectors  No weapons   Social Determinants of Corporate investment banker Strain: Low Risk    Difficulty of Paying Living Expenses: Not hard at all  Food Insecurity: No Food Insecurity   Worried About Programme researcher, broadcasting/film/video in the Last Year: Never true   Barista in the Last Year: Never true  Transportation Needs: No Transportation Needs   Lack of Transportation (Medical): No   Lack of Transportation (Non-Medical): No  Physical Activity: Inactive   Days of Exercise per Week: 0 days   Minutes of Exercise per Session: 0 min  Stress: No Stress Concern Present   Feeling of Stress : Not at all  Social Connections: Moderately Isolated   Frequency of Communication with Friends and Family: More than three times a week   Frequency of Social Gatherings  with Friends and Family: More than three times a week   Attends Religious Services: More than 4 times per year   Active Member of [employer] or Organizations: No   Attends Banker Meetings: Never   Marital Status: Widowed    Tobacco Counseling Counseling given: Yes Comment: 12/19/2013 "quit smoking in ~ 1994"   Clinical Intake:  Pre-visit preparation completed: Yes  Pain : No/denies pain Pain Score: 0-No pain     BMI - recorded: 31.39 Nutritional Status: BMI > 30  Obese Nutritional Risks: None Diabetes: No     Diabetic? No          Activities of Daily Living In your present state of health, do you have any difficulty performing the following activities: 03/23/2020  Hearing? N  Vision? N  Difficulty concentrating or making decisions? N  Walking or climbing stairs? N  Dressing or bathing? N  Doing errands, shopping? N  Some recent data might be hidden    Patient Care Team: Freddy Finner, NP as PCP - General (Family Medicine)  Indicate any recent Medical Services you may have received from other than [facility] providers in the past year (date may be approximate).     Assessment:   This is a routine wellness examination for [facility].  Hearing/Vision screen No exam data present  Dietary issues and exercise activities discussed:    Goals   None    Depression Screen PHQ 2/9 Scores 07/21/2020 06/23/2020 03/23/2020  PHQ - 2 Score 0 0 0  PHQ- 9 Score - - 0    Fall Risk Fall Risk  07/21/2020 06/23/2020 03/23/2020  Falls in the past year? 0 0 0  Number falls in past yr: - 0 0  Injury with Fall? - 0 0  Risk for fall due to : - No Fall Risks No Fall Risks  Follow up - Falls evaluation completed Falls evaluation completed    Any stairs in or around the home? No  If so, are there any without handrails? No  Home free of loose throw rugs in walkways, pet beds, electrical cords, etc? Yes  Adequate lighting in your home to reduce risk of falls? No    ASSISTIVE DEVICES UTILIZED TO PREVENT FALLS:  Life alert? Yes  Use of a cane, walker or w/c? No  Grab bars in the bathroom? Yes  Shower chair or bench in shower? Yes  Elevated toilet seat or a handicapped toilet? No   TIMED UP AND GO:  Was the test performed? No .    Cognitive Function:        Immunizations Immunization History  Administered Date(s) Administered   Fluad Quad(high  Dose 65+) 06/23/2020   Influenza-Unspecified 07/16/2013   Moderna SARS-COVID-2 Vaccination 12/10/2019, 01/07/2020   Pneumococcal Polysaccharide-23 12/25/2011    TDAP status: Up to date Flu Vaccine status: Up to date Pneumococcal vaccine status: Up to date Covid-19 vaccine status: Completed vaccines  Qualifies for Shingles Vaccine? Yes   Zostavax completed No   Shingrix Completed?: No.    Education has been provided regarding the importance of this vaccine. Patient has been advised to call insurance company to determine out of pocket expense if they have not yet received this vaccine. Advised may also receive vaccine at local pharmacy or Health Dept. Verbalized acceptance and understanding.  Screening Tests Health Maintenance  Topic Date Due   TETANUS/TDAP  07/21/2021 (Originally 02/28/1965)   Hepatitis C Screening  07/21/2021 (Originally Nov 07, 1945)   PNA vac Low Risk Adult (2 of 2 - PCV13) 07/21/2021 (Originally 12/24/2012)   INFLUENZA VACCINE  Completed   DEXA SCAN  Completed   COVID-19 Vaccine  Completed    Health Maintenance  There are no preventive care reminders to display for this patient.  Colorectal cancer screening: Completed normal. Repeat every 10 years Mammogram status: Completed normal. Repeat every year Bone Density status: Completed normal. Results reflect: Bone density results: NORMAL. Repeat every 0 years.  Lung Cancer Screening: (Low Dose CT Chest recommended if Age 24-80 years, 30 pack-year currently smoking OR have quit w/in 15years.) does not qualify.     Additional Screening:  Hepatitis C Screening: does qualify; Completed.   Vision Screening: Recommended annual ophthalmology exams for early detection of glaucoma and other disorders of the eye.  Is the patient up to date with their annual eye exam?  Yes    Who is the provider or what is the name of the office in which the patient attends annual eye exams? Pt does not see a regular Opthamologist.  If pt is not established with a provider, would they like to be referred to a provider to establish care? No .   Dental Screening: Recommended annual dental exams for proper oral hygiene  Community Resource Referral / Chronic Care Management: CRR required this visit?  No   CCM required this visit?  No      Plan:     I have personally reviewed and noted the following in the patients chart:    Medical and social history  Use of alcohol, tobacco or illicit drugs   Current medications and supplements  Functional ability and status  Nutritional status  Physical activity  Advanced directives  List of other physicians  Hospitalizations, surgeries, and ER visits in previous 12 months  Vitals  Screenings to include cognitive, depression, and falls  Referrals and appointments  In addition, I have reviewed and discussed with patient certain preventive protocols, quality metrics, and best practice recommendations. A written personalized care plan for preventive services as well as general preventive health recommendations were provided to patient.     Dellia Cloud, LPN   [id number]   Nurse Notes: AWV conducted over the phone with pt consent of televisit via audio. This call took approx 20 min and pt verbalizes understanding of this visit and recent care gaps for health maintenance. Shingrix education mailed with after visit summary

## 2020-08-11 ENCOUNTER — Other Ambulatory Visit: Payer: Self-pay | Admitting: *Deleted

## 2020-08-11 ENCOUNTER — Other Ambulatory Visit: Payer: Self-pay | Admitting: Family Medicine

## 2020-08-11 DIAGNOSIS — E039 Hypothyroidism, unspecified: Secondary | ICD-10-CM

## 2020-08-11 MED ORDER — LEVOTHYROXINE SODIUM 88 MCG PO TABS: 88.0000 ug | ORAL_TABLET | Freq: Every day | ORAL | 2 refills | Status: DC

## 2020-08-11 NOTE — Progress Notes (Signed)
ts

## 2020-09-01 ENCOUNTER — Other Ambulatory Visit: Payer: Self-pay

## 2020-09-01 ENCOUNTER — Encounter: Payer: Self-pay | Admitting: Family Medicine

## 2020-09-01 ENCOUNTER — Ambulatory Visit (INDEPENDENT_AMBULATORY_CARE_PROVIDER_SITE_OTHER): Payer: Medicare Other | Admitting: Family Medicine

## 2020-09-01 VITALS — BP 148/80 | HR 82 | Temp 97.8°F | Ht 66.0 in | Wt 193.0 lb

## 2020-09-01 DIAGNOSIS — I10 Essential (primary) hypertension: Secondary | ICD-10-CM

## 2020-09-01 NOTE — Patient Instructions (Signed)
  HAPPY FALL!  I appreciate the opportunity to provide you with care for your health and wellness. Today we discussed: BP   Follow up: 10-12 weeks for BP check  No labs or referrals today  Call the office when you are almost out of your medication so we can send in a 90 supply of new dose  For now, take two of the 5 mg to equal 10 mg daily.  Continue to take BP at home. Avoid salt, especially here at the [facility].   HAPPY THANKSGIVING !!!!  Please continue to practice social distancing to keep you, your family, and our community safe.  If you must go out, please wear a mask and practice good handwashing.  It was a pleasure to see you and I look forward to continuing to work together on your health and well-being. Please do not hesitate to call the office if you need care or have questions about your care.  Have a wonderful day and week. With Gratitude, Tereasa Coop, DNP, AGNP-BC

## 2020-09-01 NOTE — Assessment & Plan Note (Signed)
Continues to have elevated readings here in the office.  With reported home readings of systolic 150s.  Will increase lisinopril to 10 mg.  To make sure she gets better control of the 130s to 140s systolically.  Consistently diastolic to sit in the 80s.  10 to 12-week follow-up.  She is encouraged to do DASH diet and exercise as tolerated.  Overall she reports tolerating the medication well.

## 2020-09-01 NOTE — Progress Notes (Signed)
   Subjective:  Patient ID: Alexis Ross, female    DOB: 03/22/1946  Age: 74 y.o. MRN: [id number]  CC:  Chief Complaint  Patient presents with  . Follow-up    b/p      HPI  HPI   Alexis Ross is a pleasant 74-year-old female patient of mine.  She comes in today for follow-up on blood pressure.  At previous visit her blood pressure was reading on the higher end recheck it had improved but secondary to having history of having a stroke in addition to uncontrolled cholesterol and being obese it would be of benefit to make sure we get the best control of her blood pressure.  She was started on 5 mg of lisinopril reports that she is tolerating it well.  She was advised to start checking her blood pressure at home.  She reports that in the morning it is 150 over the 80s.  In the p.m. is 130 over the 80s.  She reports she takes her met blood pressure medicine in the morning and without any issue or side effect. She denies having any headaches, dizziness, chest pain, palpitations, shortness of breath, vision changes.  Today patient denies signs and symptoms of COVID 19 infection including fever, chills, cough, shortness of breath, and headache. Past Medical, Surgical, Social History, Allergies, and Medications have been Reviewed.   Past Medical History:  Diagnosis Date  . Accidental fall 12/19/2013   "tripped over pallet in store; briefly lost consciousness" (12/20/2013)  . Anemia   . Arthritis    "knees, fingers" (12/19/2013)  . Closed fracture of facial bones (HCC) 12/19/2013  . Concussion with brief LOC 12/19/2013  . Head injury, closed, with concussion 12/19/2013  . Hyperlipidemia   . Hypothyroidism   . Stroke (HCC) 12/2011   residual "left sided weakness" (12/19/2013)    Current Meds  Medication Sig  . acetaminophen (TYLENOL) 500 MG tablet Take 1,000 mg by mouth every 6 (six) hours as needed. For pain  . aspirin 325 MG tablet Take 325 mg by mouth daily.  . atorvastatin (LIPITOR) 40 MG  tablet Take 1 tablet (40 mg total) by mouth every morning.  . clopidogrel (PLAVIX) 75 MG tablet Take 1 tablet (75 mg total) by mouth daily with breakfast.  . levothyroxine (SYNTHROID) 88 MCG tablet Take 1 tablet (88 mcg total) by mouth daily before breakfast.  . lisinopril (ZESTRIL) 5 MG tablet Take 1 tablet (5 mg total) by mouth daily.    ROS:  Review of Systems  Constitutional: Negative.   HENT: Negative.   Eyes: Negative.   Respiratory: Negative.   Cardiovascular: Negative.   Gastrointestinal: Negative.   Genitourinary: Negative.   Musculoskeletal: Negative.   Skin: Negative.   Neurological: Negative.   Endo/Heme/Allergies: Negative.   Psychiatric/Behavioral: Negative.      Objective:   Today's Vitals: BP (!) 148/80 (BP Location: Right Arm, Patient Position: Sitting, Cuff Size: Normal)   Pulse 82   Temp 97.8 F (36.6 C) (Temporal)   Ht 5' 6" (1.676 m)   Wt 193 lb (87.5 kg)   SpO2 100%   BMI 31.15 kg/m  Vitals with BMI 09/01/2020 08/10/2020 06/23/2020  Height 5' 6" 5' 6" 5' 6"  Weight 193 lbs 194 lbs 194 lbs 6 oz  BMI 31.17 31.33 31.39  Systolic 148 146 146  Diastolic 80 82 82  Pulse 82 63 63     Physical Exam Vitals and nursing note reviewed.  Constitutional:        Appearance: Normal appearance. She is well-developed and well-groomed. She is obese.  HENT:     Head: Normocephalic and atraumatic.     Right Ear: External ear normal.     Left Ear: External ear normal.     Mouth/Throat:     Comments: Mask in place  Eyes:     General:        Right eye: No discharge.        Left eye: No discharge.     Conjunctiva/sclera: Conjunctivae normal.  Cardiovascular:     Rate and Rhythm: Normal rate and regular rhythm.     Pulses: Normal pulses.     Heart sounds: Normal heart sounds.  Pulmonary:     Effort: Pulmonary effort is normal.     Breath sounds: Normal breath sounds.  Musculoskeletal:        General: Normal range of motion.     Cervical back: Normal range of  motion and neck supple.  Skin:    General: Skin is warm.  Neurological:     General: No focal deficit present.     Mental Status: She is alert and oriented to person, place, and time.  Psychiatric:        Attention and Perception: Attention normal.        Mood and Affect: Mood normal.        Speech: Speech normal.        Behavior: Behavior normal. Behavior is cooperative.        Thought Content: Thought content normal.        Cognition and Memory: Cognition normal.        Judgment: Judgment normal.          Assessment   1. Essential hypertension     Tests ordered No orders of the defined types were placed in this encounter.    Plan: Please see assessment and plan per problem list above.   No orders of the defined types were placed in this encounter.   Patient to follow-up in 11/18/2020  Note: This dictation was prepared with Dragon dictation along with smaller phrase technology. Similar sounding words can be transcribed inadequately or may not be corrected upon review. Any transcriptional errors that result from this process are unintentional.      Hannah M Mills, NP 

## 2020-09-12 ENCOUNTER — Other Ambulatory Visit: Payer: Self-pay | Admitting: Family Medicine

## 2020-09-12 DIAGNOSIS — I10 Essential (primary) hypertension: Secondary | ICD-10-CM

## 2020-09-12 IMAGING — US BILATERAL CAROTID DUPLEX ULTRASOUND
1 series · 13 of 24 positions shown · non-contrast
Comparison: Prior duplex carotid ultrasound 01/21/2016

CLINICAL DATA: 72-year-old female with bilateral carotid bruit

EXAM:
BILATERAL CAROTID DUPLEX ULTRASOUND
TECHNIQUE: Gray scale imaging, color Doppler and duplex ultrasound were
performed of bilateral carotid and vertebral arteries in the neck.

[Series 1: bilateral carotid duplex ultrasound · 0.06mm/px · 13 of 96 slices shown]
[im 1/96]
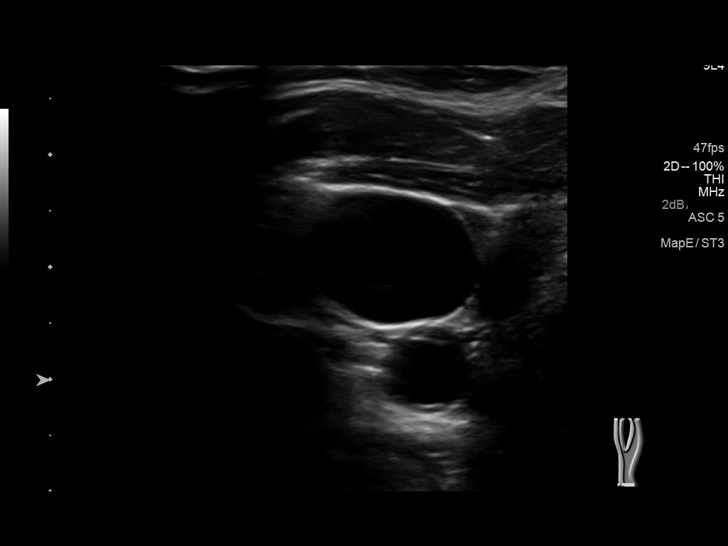
[im 9/96]
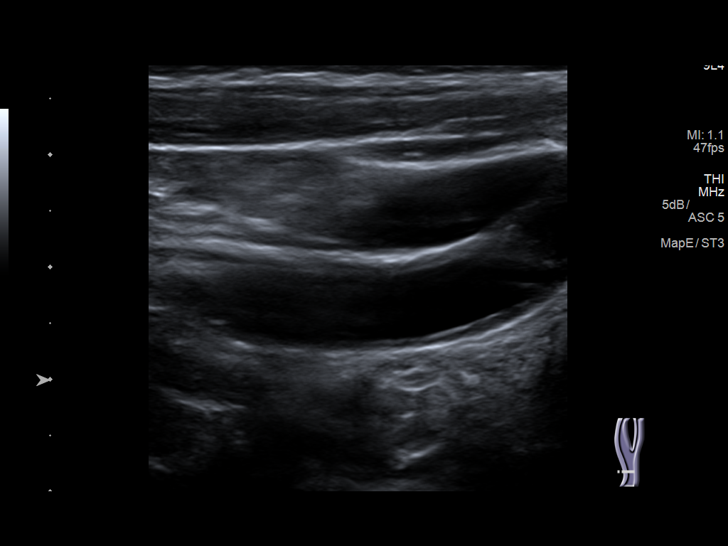
[im 17/96]
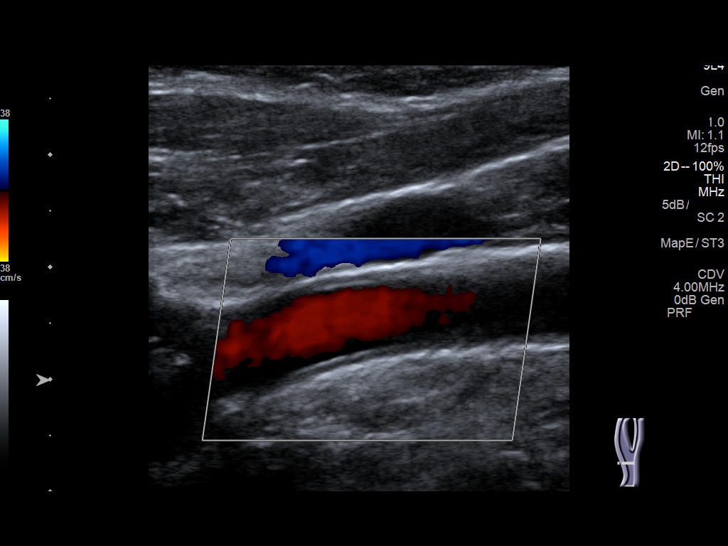
[im 25/96]
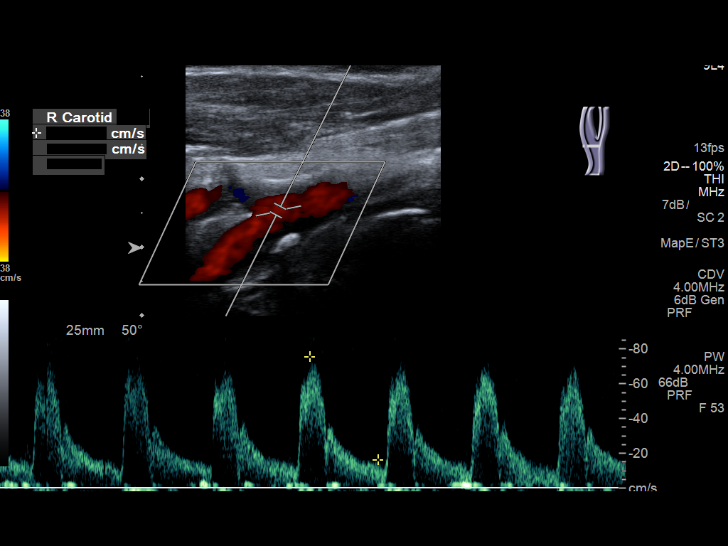
[im 34/96]
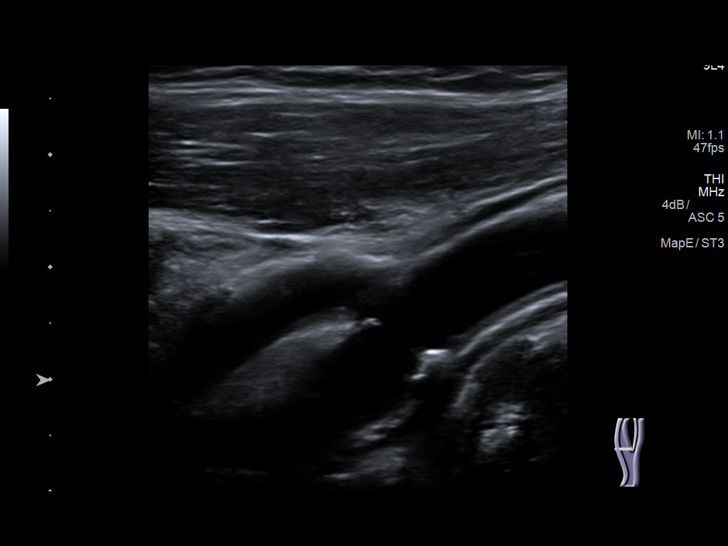
[im 42/96]
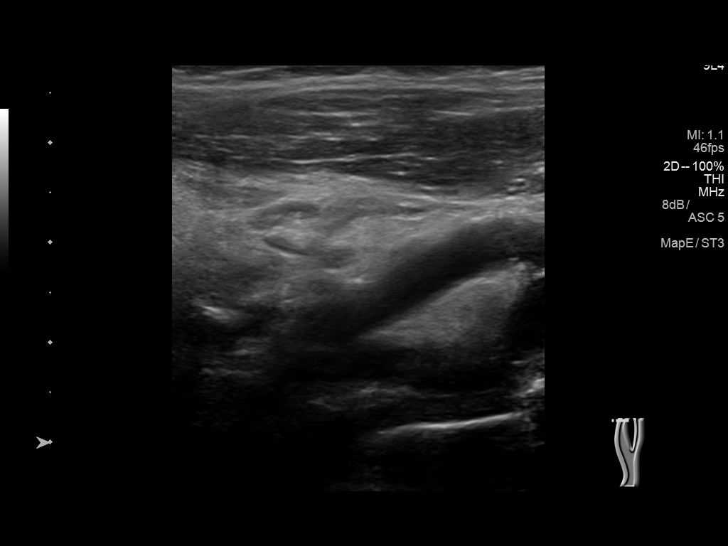
[im 50/96]
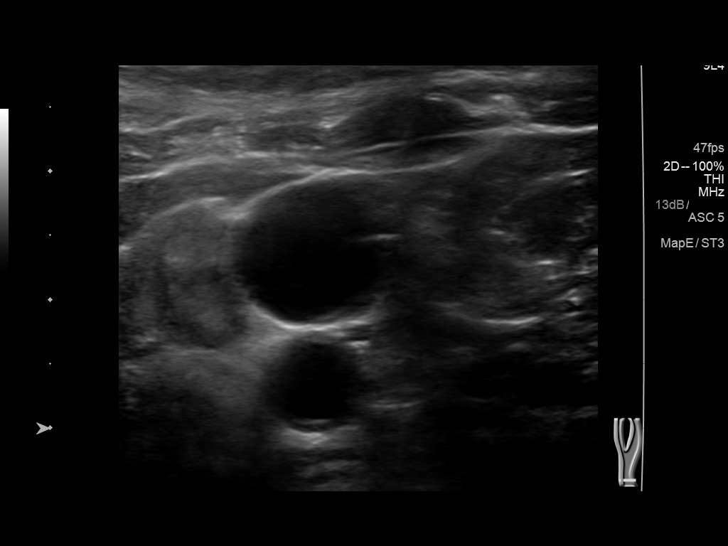
[im 54/96]
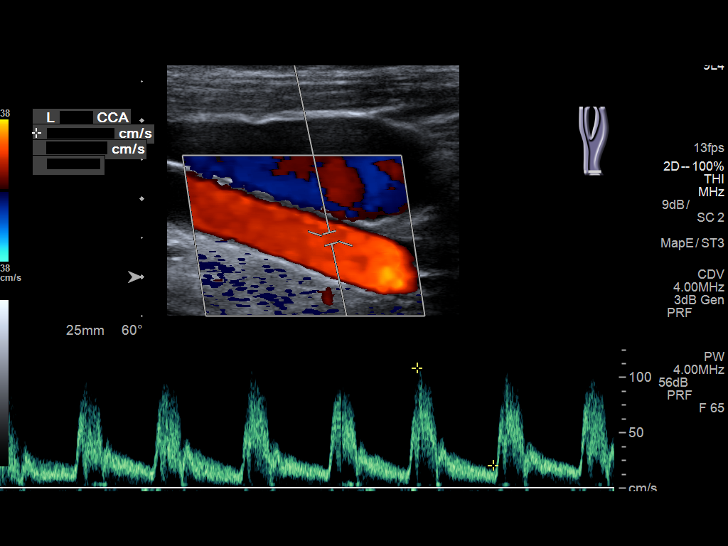
[im 62/96]
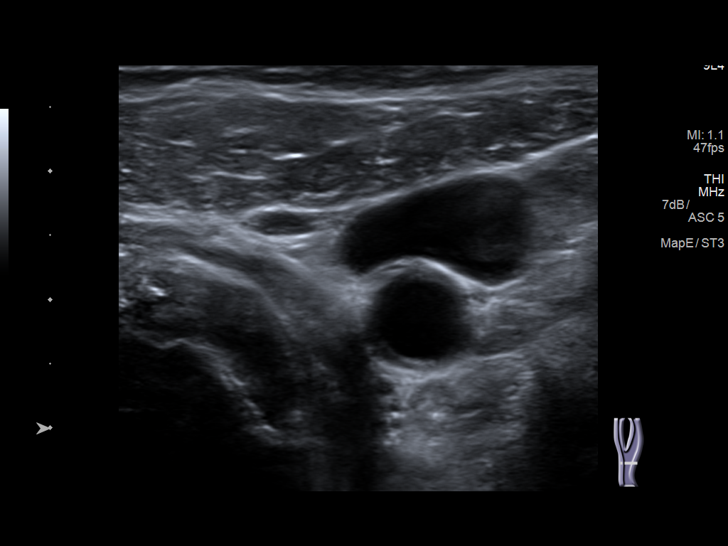
[im 71/96]
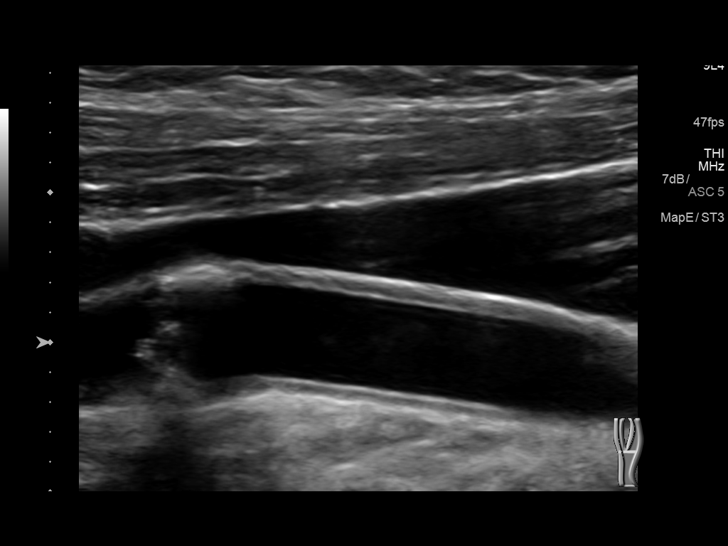
[im 79/96]
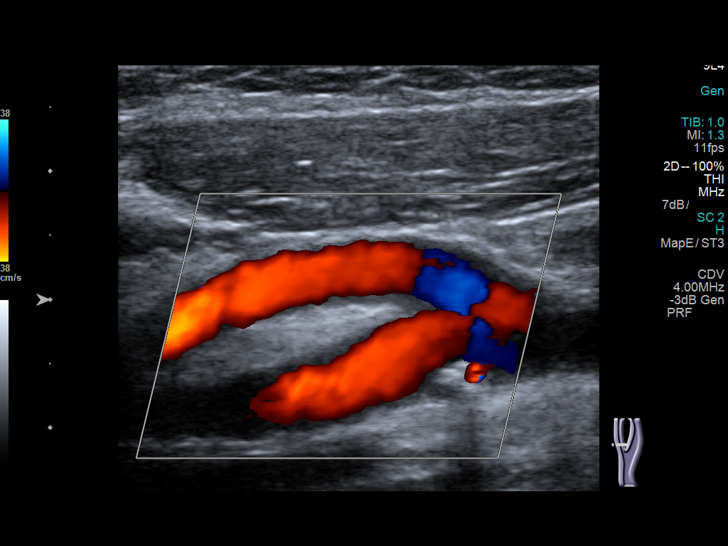
[im 87/96]
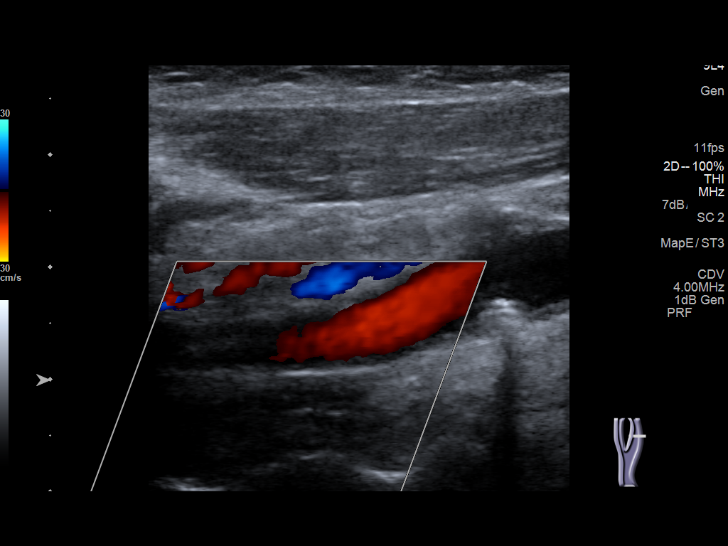
[im 96/96]
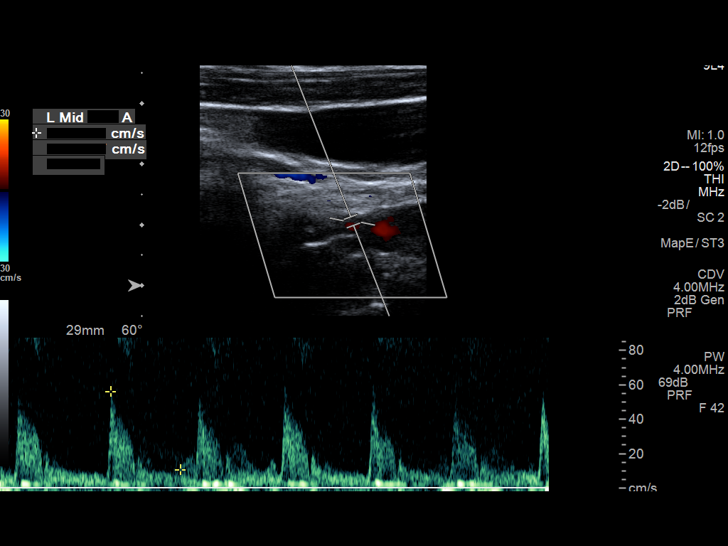

[13 of 24 positions shown; findings below may reference images not displayed]

FINDINGS: Criteria: Quantification of carotid stenosis is based on velocity
parameters that correlate the residual internal carotid diameter
with NASCET-based stenosis levels, using the diameter of the distal
internal carotid lumen as the denominator for stenosis measurement.

The following velocity measurements were obtained:

RIGHT
ICA: 113/23 cm/sec
CCA: 98/15 cm/sec

SYSTOLIC ICA/CCA RATIO:

ECA:  99 cm/sec

LEFT

ICA: 107/32 cm/sec

CCA: 112/23 cm/sec

SYSTOLIC ICA/CCA RATIO:

ECA:  92 cm/sec

RIGHT CAROTID ARTERY: Heterogeneous atherosclerotic plaque in the
carotid bifurcation extending into the proximal internal carotid
artery. By peak systolic velocity criteria, the estimated stenosis
is less than 50%.

RIGHT VERTEBRAL ARTERY:  Patent with normal antegrade flow.

LEFT CAROTID ARTERY: Mild heterogeneous atherosclerotic plaque in
the proximal internal carotid artery. By peak systolic velocity
criteria, the estimated stenosis remains less than 50%.

LEFT VERTEBRAL ARTERY:  Patent with normal antegrade flow.
IMPRESSION: 1. Mild (1-49%) stenosis proximal right internal carotid artery
secondary to heterogenous atherosclerotic plaque.
2. Mild (1-49%) stenosis proximal left internal carotid artery
secondary to heterogenous atherosclerotic plaque.
3. Vertebral arteries are patent with normal antegrade flow.

## 2020-09-14 ENCOUNTER — Other Ambulatory Visit: Payer: Self-pay

## 2020-09-14 DIAGNOSIS — I10 Essential (primary) hypertension: Secondary | ICD-10-CM

## 2020-09-14 MED ORDER — LISINOPRIL 5 MG PO TABS: 5.0000 mg | ORAL_TABLET | Freq: Every day | ORAL | 1 refills | Status: DC

## 2020-11-10 ENCOUNTER — Other Ambulatory Visit: Payer: Self-pay | Admitting: Family Medicine

## 2020-11-10 DIAGNOSIS — I10 Essential (primary) hypertension: Secondary | ICD-10-CM

## 2020-11-10 DIAGNOSIS — E039 Hypothyroidism, unspecified: Secondary | ICD-10-CM

## 2020-11-14 ENCOUNTER — Other Ambulatory Visit: Payer: Self-pay | Admitting: Family Medicine

## 2020-11-14 DIAGNOSIS — I10 Essential (primary) hypertension: Secondary | ICD-10-CM

## 2020-11-15 ENCOUNTER — Other Ambulatory Visit: Payer: Self-pay

## 2020-11-15 DIAGNOSIS — I10 Essential (primary) hypertension: Secondary | ICD-10-CM

## 2020-11-15 MED ORDER — LISINOPRIL 5 MG PO TABS: 5.0000 mg | ORAL_TABLET | Freq: Every day | ORAL | 1 refills | Status: DC

## 2020-11-18 ENCOUNTER — Ambulatory Visit: Payer: Medicare Other | Admitting: Family Medicine

## 2020-11-19 ENCOUNTER — Encounter: Payer: Self-pay | Admitting: Internal Medicine

## 2020-11-19 ENCOUNTER — Ambulatory Visit (INDEPENDENT_AMBULATORY_CARE_PROVIDER_SITE_OTHER): Payer: Medicare Other | Admitting: Internal Medicine

## 2020-11-19 ENCOUNTER — Other Ambulatory Visit: Payer: Self-pay

## 2020-11-19 VITALS — BP 177/64 | HR 90 | Temp 97.5°F | Ht 67.0 in | Wt 192.0 lb

## 2020-11-19 DIAGNOSIS — E039 Hypothyroidism, unspecified: Secondary | ICD-10-CM

## 2020-11-19 DIAGNOSIS — E785 Hyperlipidemia, unspecified: Secondary | ICD-10-CM | POA: Diagnosis not present

## 2020-11-19 DIAGNOSIS — Z8673 Personal history of transient ischemic attack (TIA), and cerebral infarction without residual deficits: Secondary | ICD-10-CM

## 2020-11-19 DIAGNOSIS — I10 Essential (primary) hypertension: Secondary | ICD-10-CM | POA: Diagnosis not present

## 2020-11-19 MED ORDER — LISINOPRIL-HYDROCHLOROTHIAZIDE 10-12.5 MG PO TABS: 1.0000 | ORAL_TABLET | Freq: Every day | ORAL | 0 refills | Status: DC

## 2020-11-19 NOTE — Patient Instructions (Addendum)
Please start taking Lisinopril-HCTZ as prescribed and stop taking your current Lisinopril.  Please follow low salt diet and perform moderate exercise/walking at least 150 mins/week.  Please continue to check BP at home and bring the log in the next visit.  Please get fasting blood tests done before the next visit.

## 2020-11-19 NOTE — Assessment & Plan Note (Signed)
No focal neurologic deficits On Plavix and statin 

## 2020-11-19 NOTE — Assessment & Plan Note (Signed)
BP Readings from Last 1 Encounters:  11/19/20 (!) 177/64   Repeat check - 170/72 uncontrolled with Lisinopril 10 mg Although has not had morning dose yet, does not justify such a high BP. Reports SBP above 150 in the morning at home. Switched to Lisinopril-HCTZ 10-12.5 mg QD Advised for proper hydration Counseled for compliance with the medications Advised DASH diet and moderate exercise/walking, at least 150 mins/week

## 2020-11-19 NOTE — Progress Notes (Signed)
Established Patient Office Visit  Subjective:  Patient ID: Alexis Ross, female    DOB: Aug 20, 1946  Age: 75 y.o. MRN: [id number]  CC:  Chief Complaint  Patient presents with  . Follow-up    Hypertension     HPI Alexis Ross is a 75 year old female with PMH of uncontrolled HTN, CVA and HLD who presents for follow up of HTN.  She had elevated BP on multiple readings today - 177/64 and 170/72. She takes Lisinopril 10 mg around 10 AM in the morning, but has not taken it yet. She reports her BP being in the 130s/80s range at home, but it ranges above 150s in the morning. Denies any headache, dizziness, chest pain, dyspnea or palpitations.  Past Medical History:  Diagnosis Date  . Accidental fall 12/19/2013   "tripped over pallet in store; briefly lost consciousness" (12/20/2013)  . Anemia   . Arthritis    "knees, fingers" (12/19/2013)  . Closed fracture of facial bones ([place]) 12/19/2013  . Concussion with brief LOC 12/19/2013  . Head injury, closed, with concussion 12/19/2013  . Hyperlipidemia   . Hypothyroidism   . Stroke [facility]) 12/2011   residual "left sided weakness" (12/19/2013)    Past Surgical History:  Procedure Laterality Date  . NO PAST SURGERIES      History reviewed. No pertinent family history.  Social History   Socioeconomic History  . Marital status: Widowed    Spouse name: Not on file  . Number of children: 4  . Years of education: Not on file  . Highest education level: 11th grade  Occupational History  . Not on file  Tobacco Use  . Smoking status: Former Smoker    Packs/day: 0.50    Years: 30.00    Pack years: 15.00    Types: Cigarettes  . Smokeless tobacco: Never Used  . Tobacco comment: 12/19/2013 "quit smoking in ~ 1994"  Substance and Sexual Activity  . Alcohol use: Yes    Comment: 12/19/2013 "drank a little when I was young"  . Drug use: No  . Sexual activity: Yes  Other Topics Concern  . Not on file  Social History Narrative   Lives alone    4 children: Sheldon Silvan       Enjoys: church, relaxing, read, watch tv      Diet: eats all foods groups   Caffeine: 1-2 cups coffee   Water: 6-8 cups daily      Wears seat belt-does not drive    Smoke detectors    No weapons   Social Determinants of Health   Financial Resource Strain: Low Risk   . Difficulty of Paying Living Expenses: Not very hard  Food Insecurity: No Food Insecurity  . Worried About Charity fundraiser in the Last Year: Never true  . Ran Out of Food in the Last Year: Never true  Transportation Needs: No Transportation Needs  . Lack of Transportation (Medical): No  . Lack of Transportation (Non-Medical): No  Physical Activity: Sufficiently Active  . Days of Exercise per Week: 3 days  . Minutes of Exercise per Session: 60 min  Stress: No Stress Concern Present  . Feeling of Stress : Not at all  Social Connections: Moderately Isolated  . Frequency of Communication with Friends and Family: More than three times a week  . Frequency of Social Gatherings with Friends and Family: Twice a week  . Attends Religious Services: More than 4 times per year  . Active  Member of Clubs or Organizations: No  . Attends Archivist Meetings: Never  . Marital Status: Widowed  Intimate Partner Violence: Not At Risk  . Fear of Current or Ex-Partner: No  . Emotionally Abused: No  . Physically Abused: No  . Sexually Abused: No    Outpatient Medications Prior to Visit  Medication Sig Dispense Refill  . acetaminophen (TYLENOL) 500 MG tablet Take 1,000 mg by mouth every 6 (six) hours as needed. For pain    . aspirin 325 MG tablet Take 325 mg by mouth daily.    Marland Kitchen atorvastatin (LIPITOR) 40 MG tablet Take 1 tablet (40 mg total) by mouth every morning. 90 tablet 2  . clopidogrel (PLAVIX) 75 MG tablet Take 1 tablet (75 mg total) by mouth daily with breakfast. 90 tablet 2  . levothyroxine (SYNTHROID) 88 MCG tablet TAKE 1 TABLET BY MOUTH ONCE DAILY BEFORE  BREAKFAST 90 tablet 0  . lisinopril (ZESTRIL) 5 MG tablet Take 1 tablet (5 mg total) by mouth daily. 90 tablet 1   No facility-administered medications prior to visit.    No Known Allergies  ROS Review of Systems  Constitutional: Negative for chills and fever.  HENT: Negative for congestion, sinus pressure, sinus pain and sore throat.   Eyes: Negative for pain and discharge.  Respiratory: Negative for cough and shortness of breath.   Cardiovascular: Negative for chest pain and palpitations.  Gastrointestinal: Negative for abdominal pain, constipation, diarrhea, nausea and vomiting.  Endocrine: Negative for polydipsia and polyuria.  Genitourinary: Negative for dysuria and hematuria.  Musculoskeletal: Negative for neck pain and neck stiffness.  Skin: Negative for rash.  Neurological: Negative for dizziness and weakness.  Psychiatric/Behavioral: Negative for agitation and behavioral problems.      Objective:    Physical Exam Vitals reviewed.  Constitutional:      General: She is not in acute distress.    Appearance: She is not diaphoretic.  HENT:     Head: Normocephalic and atraumatic.     Nose: Nose normal. No congestion.     Mouth/Throat:     Mouth: Mucous membranes are moist.     Pharynx: No posterior oropharyngeal erythema.  Eyes:     General: No scleral icterus.    Extraocular Movements: Extraocular movements intact.     Pupils: Pupils are equal, round, and reactive to light.  Cardiovascular:     Rate and Rhythm: Normal rate and regular rhythm.     Heart sounds: No murmur heard.   Pulmonary:     Breath sounds: Normal breath sounds. No wheezing or rales.  Musculoskeletal:     Cervical back: Neck supple. No tenderness.     Right lower leg: No edema.     Left lower leg: No edema.  Skin:    General: Skin is warm.     Findings: No rash.  Neurological:     General: No focal deficit present.     Mental Status: She is alert and oriented to person, place, and time.   Psychiatric:        Mood and Affect: Mood normal.        Behavior: Behavior normal.     BP (!) 177/64 (BP Location: Left Arm, Patient Position: Sitting, Cuff Size: Normal)   Pulse 90   Temp (!) 97.5 F (36.4 C) (Temporal)   Ht $R'5\' 7"'FM$  (1.702 m)   Wt 192 lb (87.1 kg)   SpO2 99%   BMI 30.07 kg/m  Wt Readings from Last 3  Encounters:  11/19/20 192 lb (87.1 kg)  09/01/20 193 lb (87.5 kg)  08/10/20 194 lb (88 kg)     There are no preventive care reminders to display for this patient.  There are no preventive care reminders to display for this patient.  Lab Results  Component Value Date   TSH 17.800 (H) 08/05/2020   Lab Results  Component Value Date   WBC 7.8 06/23/2020   HGB 12.7 06/23/2020   HCT 38.2 06/23/2020   MCV 90 06/23/2020   PLT 245 06/23/2020   Lab Results  Component Value Date   NA 141 06/23/2020   K 4.5 06/23/2020   CO2 25 06/23/2020   GLUCOSE 88 06/23/2020   BUN 8 06/23/2020   CREATININE 1.02 (H) 06/23/2020   BILITOT 0.4 06/23/2020   ALKPHOS 96 06/23/2020   AST 17 06/23/2020   ALT 13 06/23/2020   PROT 7.2 06/23/2020   ALBUMIN 4.8 (H) 06/23/2020   CALCIUM 9.4 06/23/2020   ANIONGAP 8 01/03/2016   Lab Results  Component Value Date   CHOL 186 03/24/2020   Lab Results  Component Value Date   HDL 60 03/24/2020   Lab Results  Component Value Date   LDLCALC 106 (H) 03/24/2020   Lab Results  Component Value Date   TRIG 103 03/24/2020   Lab Results  Component Value Date   CHOLHDL 3.1 03/24/2020   No results found for: HGBA1C    Assessment & Plan:   Problem List Items Addressed This Visit      Cardiovascular and Mediastinum   Essential hypertension - Primary    BP Readings from Last 1 Encounters:  11/19/20 (!) 177/64   Repeat check - 170/72 uncontrolled with Lisinopril 10 mg Although has not had morning dose yet, does not justify such a high BP. Reports SBP above 150 in the morning at home. Switched to Lisinopril-HCTZ 10-12.5 mg  QD Advised for proper hydration Counseled for compliance with the medications Advised DASH diet and moderate exercise/walking, at least 150 mins/week       Relevant Medications   lisinopril-hydrochlorothiazide (ZESTORETIC) 10-12.5 MG tablet   Other Relevant Orders   CBC   CMP14+EGFR     Endocrine   Hypothyroidism   Relevant Orders   TSH + free T4     Other   Hyperlipidemia   Relevant Medications   lisinopril-hydrochlorothiazide (ZESTORETIC) 10-12.5 MG tablet   Other Relevant Orders   Hemoglobin A1c   Lipid panel   History of CVA (cerebrovascular accident)    No focal neurologic deficits On Plavix and statin         Meds ordered this encounter  Medications  . lisinopril-hydrochlorothiazide (ZESTORETIC) 10-12.5 MG tablet    Sig: Take 1 tablet by mouth daily.    Dispense:  90 tablet    Refill:  0    Follow-up: Return in about 3 months (around 02/16/2021) for HTN follow up.    Lindell Spar, MD

## 2020-11-21 ENCOUNTER — Other Ambulatory Visit: Payer: Self-pay | Admitting: Family Medicine

## 2021-01-19 ENCOUNTER — Other Ambulatory Visit: Payer: Self-pay | Admitting: Family Medicine

## 2021-02-07 ENCOUNTER — Other Ambulatory Visit: Payer: Self-pay | Admitting: Family Medicine

## 2021-02-07 DIAGNOSIS — R7303 Prediabetes: Secondary | ICD-10-CM | POA: Diagnosis not present

## 2021-02-07 DIAGNOSIS — E785 Hyperlipidemia, unspecified: Secondary | ICD-10-CM | POA: Diagnosis not present

## 2021-02-07 DIAGNOSIS — E039 Hypothyroidism, unspecified: Secondary | ICD-10-CM

## 2021-02-07 DIAGNOSIS — I10 Essential (primary) hypertension: Secondary | ICD-10-CM | POA: Diagnosis not present

## 2021-02-08 LAB — CMP14+EGFR
ALT: 12 IU/L (ref 0–32)
Albumin: 4.6 g/dL (ref 3.7–4.7)
CO2: 21 mmol/L (ref 20–29)
Calcium: 9.7 mg/dL (ref 8.7–10.3)
Creatinine, Ser: 0.85 mg/dL (ref 0.57–1.00)
Potassium: 4.2 mmol/L (ref 3.5–5.2)

## 2021-02-08 LAB — CBC: RDW: 13 % (ref 11.7–15.4)

## 2021-02-08 LAB — LIPID PANEL

## 2021-02-09 LAB — CMP14+EGFR
AST: 15 IU/L (ref 0–40)
Albumin/Globulin Ratio: 1.7 (ref 1.2–2.2)
Alkaline Phosphatase: 99 IU/L (ref 44–121)
BUN/Creatinine Ratio: 12 (ref 12–28)
BUN: 10 mg/dL (ref 8–27)
Bilirubin Total: 0.3 mg/dL (ref 0.0–1.2)
Chloride: 98 mmol/L (ref 96–106)
Globulin, Total: 2.7 g/dL (ref 1.5–4.5)
Glucose: 94 mg/dL (ref 65–99)
Sodium: 137 mmol/L (ref 134–144)
Total Protein: 7.3 g/dL (ref 6.0–8.5)
eGFR: 71 mL/min/{1.73_m2} (ref >59)

## 2021-02-09 LAB — CBC
Hematocrit: 38.3 % (ref 34.0–46.6)
Hemoglobin: 12.1 g/dL (ref 11.1–15.9)
MCH: 27.4 pg (ref 26.6–33.0)
MCHC: 31.6 g/dL (ref 31.5–35.7)
MCV: 87 fL (ref 79–97)
Platelets: 301 10*3/uL (ref 150–450)
RBC: 4.41 x10E6/uL (ref 3.77–5.28)
WBC: 8.5 10*3/uL (ref 3.4–10.8)

## 2021-02-09 LAB — LIPID PANEL
Cholesterol, Total: 184 mg/dL (ref 100–199)
HDL: 72 mg/dL (ref >39)
LDL Chol Calc (NIH): 98 mg/dL (ref 0–99)
VLDL Cholesterol Cal: 14 mg/dL (ref 5–40)

## 2021-02-09 LAB — HEMOGLOBIN A1C
Est. average glucose Bld gHb Est-mCnc: 137 mg/dL
Hgb A1c MFr Bld: 6.4 % — ABNORMAL HIGH (ref 4.8–5.6)

## 2021-02-09 LAB — TSH+FREE T4
Free T4: 1.22 ng/dL (ref 0.82–1.77)
TSH: 1.93 u[IU]/mL (ref 0.450–4.500)

## 2021-02-16 ENCOUNTER — Ambulatory Visit (INDEPENDENT_AMBULATORY_CARE_PROVIDER_SITE_OTHER): Payer: Medicare Other | Admitting: Internal Medicine

## 2021-02-16 ENCOUNTER — Other Ambulatory Visit: Payer: Self-pay

## 2021-02-16 ENCOUNTER — Encounter: Payer: Self-pay | Admitting: Internal Medicine

## 2021-02-16 VITALS — BP 148/72 | HR 84 | Resp 18 | Ht 66.0 in | Wt 191.4 lb

## 2021-02-16 DIAGNOSIS — Z8673 Personal history of transient ischemic attack (TIA), and cerebral infarction without residual deficits: Secondary | ICD-10-CM | POA: Diagnosis not present

## 2021-02-16 DIAGNOSIS — R7303 Prediabetes: Secondary | ICD-10-CM | POA: Diagnosis not present

## 2021-02-16 DIAGNOSIS — E039 Hypothyroidism, unspecified: Secondary | ICD-10-CM | POA: Diagnosis not present

## 2021-02-16 DIAGNOSIS — I10 Essential (primary) hypertension: Secondary | ICD-10-CM

## 2021-02-16 DIAGNOSIS — E785 Hyperlipidemia, unspecified: Secondary | ICD-10-CM

## 2021-02-16 MED ORDER — ATORVASTATIN CALCIUM 40 MG PO TABS: 1.0000 | ORAL_TABLET | Freq: Every morning | ORAL | 3 refills | Status: DC

## 2021-02-16 MED ORDER — CLOPIDOGREL BISULFATE 75 MG PO TABS
1.0000 | ORAL_TABLET | Freq: Every day | ORAL | 1 refills | Status: DC
Start: 2021-02-16 — End: 2021-08-19

## 2021-02-16 MED ORDER — LISINOPRIL-HYDROCHLOROTHIAZIDE 10-12.5 MG PO TABS
1.0000 | ORAL_TABLET | Freq: Every day | ORAL | 1 refills | Status: DC
Start: 2021-02-16 — End: 2021-07-25

## 2021-02-16 MED ORDER — LEVOTHYROXINE SODIUM 88 MCG PO TABS: 88.0000 ug | ORAL_TABLET | Freq: Every day | ORAL | 1 refills | Status: DC

## 2021-02-16 NOTE — Assessment & Plan Note (Signed)
Reviewed lipid profile On Atorvastatin

## 2021-02-16 NOTE — Assessment & Plan Note (Signed)
Lab Results  Component Value Date   HGBA1C 6.4 (H) 02/07/2021   Advised to follow low carb diet for now She admits she takes sweets, but will reduce it. Will recheck HbA1C in the next visit

## 2021-02-16 NOTE — Assessment & Plan Note (Signed)
No focal neurologic deficits On Plavix and statin DC Aspirin

## 2021-02-16 NOTE — Assessment & Plan Note (Signed)
Lab Results  Component Value Date   TSH 1.930 02/07/2021   On Levothyroxine 88 mcg QD

## 2021-02-16 NOTE — Assessment & Plan Note (Signed)
BP Readings from Last 1 Encounters:  02/16/21 (!) 148/72   Overall well-controlled with Lisinopril-HCTZ Home BP readings reviewed ~ 130s/60s Counseled for compliance with the medications Advised DASH diet and moderate exercise/walking, at least 150 mins/week

## 2021-02-16 NOTE — Progress Notes (Addendum)
Established Patient Office Visit  Subjective:  Patient ID: Alexis Ross, female    DOB: 1946-07-03  Age: 75 y.o. MRN: [id number]  CC:  Chief Complaint  Patient presents with   Follow-up    3 month follow up HTN     HPI Alexis Ross is a 75 year old female with PMH of HTN, CVA and HLD who presents for follow up of her chronic medical conditions.  HTN: Her BP was elevated today, but she has not had her medication yet. She has brought BP readings from home, which ranges around 130s/60s. She denies any headache, dizziness, chest pain, dyspnea or palpitations.  Hypothyroidism: TSH wnl now. Denies any constipation, diarrhea, recent weight or appetite change or recent skin or nail changes.  CVA: Takes Plavix and statin. She also takes Aspirin. Denies any focal numbness or weakness.   Past Medical History:  Diagnosis Date   Accidental fall 12/19/2013   "tripped over pallet in store; briefly lost consciousness" (12/20/2013)   Anemia    Arthritis    "knees, fingers" (12/19/2013)   Closed fracture of facial bones ([place]) 12/19/2013   Concussion with brief LOC 12/19/2013   Head injury, closed, with concussion 12/19/2013   Hyperlipidemia    Hypothyroidism    Stroke ([place]) 12/2011   residual "left sided weakness" (12/19/2013)    Past Surgical History:  Procedure Laterality Date   NO PAST SURGERIES      History reviewed. No pertinent family history.  Social History   Socioeconomic History   Marital status: Widowed    Spouse name: Not on file   Number of children: 4   Years of education: Not on file   Highest education level: 11th grade  Occupational History   Not on file  Tobacco Use   Smoking status: Former Smoker    Packs/day: 0.50    Years: 30.00    Pack years: 15.00    Types: Cigarettes   Smokeless tobacco: Never Used   Tobacco comment: 12/19/2013 "quit smoking in ~ 1994"  Substance and Sexual Activity   Alcohol use: Yes    Comment: 12/19/2013 "drank a little when I was  young"   Drug use: No   Sexual activity: Yes  Other Topics Concern   Not on file  Social History Narrative   Lives alone   4 children: Sheldon Silvan       Enjoys: church, relaxing, read, watch tv      Diet: eats all foods groups   Caffeine: 1-2 cups coffee   Water: 6-8 cups daily      Wears seat belt-does not drive    Smoke detectors    No weapons   Social Determinants of Radio broadcast assistant Strain: Low Risk    Difficulty of Paying Living Expenses: Not very hard  Food Insecurity: No Food Insecurity   Worried About Charity fundraiser in the Last Year: Never true   Arboriculturist in the Last Year: Never true  Transportation Needs: No Transportation Needs   Lack of Transportation (Medical): No   Lack of Transportation (Non-Medical): No  Physical Activity: Sufficiently Active   Days of Exercise per Week: 3 days   Minutes of Exercise per Session: 60 min  Stress: No Stress Concern Present   Feeling of Stress : Not at all  Social Connections: Moderately Isolated   Frequency of Communication with Friends and Family: More than three times a week   Frequency of Social Gatherings  with Friends and Family: Twice a week   Attends Religious Services: More than 4 times per year   Active Member of Clubs or Organizations: No   Attends Archivist Meetings: Never   Marital Status: Widowed  Human resources officer Violence: Not At Risk   Fear of Current or Ex-Partner: No   Emotionally Abused: No   Physically Abused: No   Sexually Abused: No    Outpatient Medications Prior to Visit  Medication Sig Dispense Refill   acetaminophen (TYLENOL) 500 MG tablet Take 1,000 mg by mouth every 6 (six) hours as needed. For pain     aspirin 325 MG tablet Take 325 mg by mouth daily.     atorvastatin (LIPITOR) 40 MG tablet TAKE 1 TABLET BY MOUTH ONCE DAILY IN THE MORNING 90 tablet 0   clopidogrel (PLAVIX) 75 MG tablet Take 1 tablet by mouth once daily with breakfast 90 tablet  0   levothyroxine (SYNTHROID) 88 MCG tablet TAKE 1 TABLET BY MOUTH ONCE DAILY BEFORE BREAKFAST 90 tablet 0   lisinopril-hydrochlorothiazide (ZESTORETIC) 10-12.5 MG tablet Take 1 tablet by mouth daily. 90 tablet 0   No facility-administered medications prior to visit.    No Known Allergies  ROS Review of Systems  Constitutional:  Negative for chills and fever.  HENT:  Negative for congestion, sinus pressure, sinus pain and sore throat.   Eyes:  Negative for pain and discharge.  Respiratory:  Negative for cough and shortness of breath.   Cardiovascular:  Negative for chest pain and palpitations.  Gastrointestinal:  Negative for abdominal pain, constipation, diarrhea, nausea and vomiting.  Endocrine: Negative for polydipsia and polyuria.  Genitourinary:  Negative for dysuria and hematuria.  Musculoskeletal:  Negative for neck pain and neck stiffness.  Skin:  Negative for rash.  Neurological:  Negative for dizziness and weakness.  Psychiatric/Behavioral:  Negative for agitation and behavioral problems.      Objective:    Physical Exam Vitals reviewed.  Constitutional:      General: She is not in acute distress.    Appearance: She is not diaphoretic.  HENT:     Head: Normocephalic and atraumatic.     Nose: Nose normal. No congestion.     Mouth/Throat:     Mouth: Mucous membranes are moist.     Pharynx: No posterior oropharyngeal erythema.  Eyes:     General: No scleral icterus.    Extraocular Movements: Extraocular movements intact.  Cardiovascular:     Rate and Rhythm: Normal rate and regular rhythm.     Pulses: Normal pulses.     Heart sounds: Normal heart sounds. No murmur heard. Pulmonary:     Breath sounds: Normal breath sounds. No wheezing or rales.  Musculoskeletal:     Cervical back: Neck supple. No tenderness.     Right lower leg: No edema.     Left lower leg: No edema.  Skin:    General: Skin is warm.     Findings: No rash.  Neurological:     General: No  focal deficit present.     Mental Status: She is alert and oriented to person, place, and time.  Psychiatric:        Mood and Affect: Mood normal.        Behavior: Behavior normal.    BP (!) 148/72 (BP Location: Right Arm, Patient Position: Sitting, Cuff Size: Normal)   Pulse 84   Resp 18   Ht $R'5\' 6"'no$  (1.676 m)   Wt 191 lb 6.4  oz (86.8 kg)   SpO2 100%   BMI 30.89 kg/m  Wt Readings from Last 3 Encounters:  02/16/21 191 lb 6.4 oz (86.8 kg)  11/19/20 192 lb (87.1 kg)  09/01/20 193 lb (87.5 kg)     There are no preventive care reminders to display for this patient.  There are no preventive care reminders to display for this patient.  Lab Results  Component Value Date   TSH 1.930 02/07/2021   Lab Results  Component Value Date   WBC 8.5 02/07/2021   HGB 12.1 02/07/2021   HCT 38.3 02/07/2021   MCV 87 02/07/2021   PLT 301 02/07/2021   Lab Results  Component Value Date   NA 137 02/07/2021   K 4.2 02/07/2021   CO2 21 02/07/2021   GLUCOSE 94 02/07/2021   BUN 10 02/07/2021   CREATININE 0.85 02/07/2021   BILITOT 0.3 02/07/2021   ALKPHOS 99 02/07/2021   AST 15 02/07/2021   ALT 12 02/07/2021   PROT 7.3 02/07/2021   ALBUMIN 4.6 02/07/2021   CALCIUM 9.7 02/07/2021   ANIONGAP 8 01/03/2016   EGFR 71 02/07/2021   Lab Results  Component Value Date   CHOL 184 02/07/2021   Lab Results  Component Value Date   HDL 72 02/07/2021   Lab Results  Component Value Date   LDLCALC 98 02/07/2021   Lab Results  Component Value Date   TRIG 75 02/07/2021   Lab Results  Component Value Date   CHOLHDL 2.6 02/07/2021   Lab Results  Component Value Date   HGBA1C 6.4 (H) 02/07/2021      Assessment & Plan:   Problem List Items Addressed This Visit       Cardiovascular and Mediastinum   Essential hypertension - Primary    BP Readings from Last 1 Encounters:  02/16/21 (!) 148/72   Overall well-controlled with Lisinopril-HCTZ Home BP readings reviewed ~  130s/60s Counseled for compliance with the medications Advised DASH diet and moderate exercise/walking, at least 150 mins/week      Relevant Medications   atorvastatin (LIPITOR) 40 MG tablet   lisinopril-hydrochlorothiazide (ZESTORETIC) 10-12.5 MG tablet     Endocrine   Hypothyroidism    Lab Results  Component Value Date   TSH 1.930 02/07/2021   On Levothyroxine 88 mcg QD      Relevant Medications   levothyroxine (SYNTHROID) 88 MCG tablet     Other   Hyperlipidemia    Reviewed lipid profile On Atorvastatin      Relevant Medications   atorvastatin (LIPITOR) 40 MG tablet   lisinopril-hydrochlorothiazide (ZESTORETIC) 10-12.5 MG tablet   History of CVA (cerebrovascular accident)    No focal neurologic deficits On Plavix and statin DC Aspirin      Relevant Medications   clopidogrel (PLAVIX) 75 MG tablet   Prediabetes    Lab Results  Component Value Date   HGBA1C 6.4 (H) 02/07/2021   Advised to follow low carb diet for now She admits she takes sweets, but will reduce it. Will recheck HbA1C in the next visit          Meds ordered this encounter  Medications   atorvastatin (LIPITOR) 40 MG tablet    Sig: Take 1 tablet (40 mg total) by mouth every morning.    Dispense:  90 tablet    Refill:  3   levothyroxine (SYNTHROID) 88 MCG tablet    Sig: Take 1 tablet (88 mcg total) by mouth daily before breakfast.    Dispense:  90 tablet    Refill:  1   lisinopril-hydrochlorothiazide (ZESTORETIC) 10-12.5 MG tablet    Sig: Take 1 tablet by mouth daily.    Dispense:  90 tablet    Refill:  1   clopidogrel (PLAVIX) 75 MG tablet    Sig: Take 1 tablet (75 mg total) by mouth daily with breakfast.    Dispense:  90 tablet    Refill:  1    Follow-up: Return in about 6 months (around 08/19/2021) for HTN and prediabetes, HbA1C check.    Lindell Spar, MD

## 2021-02-16 NOTE — Patient Instructions (Addendum)
Please stop taking Aspirin.  Continue taking other medications as prescribed.  Please follow DASH diet and perform moderate exercise/walking at least 150 mins/week. [email or web address]">  DASH Eating Plan DASH stands for Dietary Approaches to Stop Hypertension. The DASH eating plan is a healthy eating plan that has been shown to:  Reduce high blood pressure (hypertension).  Reduce your risk for type 2 diabetes, heart disease, and stroke.  Help with weight loss. What are tips for following this plan? Reading food labels  Check food labels for the amount of salt (sodium) per serving. Choose foods with less than 5 percent of the Daily Value of sodium. Generally, foods with less than 300 milligrams (mg) of sodium per serving fit into this eating plan.  To find whole grains, look for the word "whole" as the first word in the ingredient list. Shopping  Buy products labeled as "low-sodium" or "no salt added."  Buy fresh foods. Avoid canned foods and pre-made or frozen meals. Cooking  Avoid adding salt when cooking. Use salt-free seasonings or herbs instead of table salt or sea salt. Check with your health care provider or pharmacist before using salt substitutes.  Do not fry foods. Cook foods using healthy methods such as baking, boiling, grilling, roasting, and broiling instead.  Cook with heart-healthy oils, such as olive, canola, avocado, soybean, or sunflower oil. Meal planning  Eat a balanced diet that includes: ? 4 or more servings of fruits and 4 or more servings of vegetables each day. Try to fill one-half of your plate with fruits and vegetables. ? 6-8 servings of whole grains each day. ? Less than 6 oz (170 g) of lean meat, poultry, or fish each day. A 3-oz (85-g) serving of meat is about the same size as a deck of cards. One egg equals 1 oz (28 g). ? 2-3 servings of low-fat dairy each day. One serving is 1 cup (237 mL). ? 1  serving of nuts, seeds, or beans 5 times each week. ? 2-3 servings of heart-healthy fats. Healthy fats called omega-3 fatty acids are found in foods such as walnuts, flaxseeds, fortified milks, and eggs. These fats are also found in cold-water fish, such as sardines, salmon, and mackerel.  Limit how much you eat of: ? Canned or prepackaged foods. ? Food that is high in trans fat, such as some fried foods. ? Food that is high in saturated fat, such as fatty meat. ? Desserts and other sweets, sugary drinks, and other foods with added sugar. ? Full-fat dairy products.  Do not salt foods before eating.  Do not eat more than 4 egg yolks a week.  Try to eat at least 2 vegetarian meals a week.  Eat more home-cooked food and less restaurant, buffet, and fast food.   Lifestyle  When eating at a restaurant, ask that your food be prepared with less salt or no salt, if possible.  If you drink alcohol: ? Limit how much you use to:  0-1 drink a day for women who are not pregnant.  0-2 drinks a day for men. ? Be aware of how much alcohol is in your drink. In the [place] one drink equals one 12 oz bottle of beer (355 mL), one 5 oz glass of wine (148 mL), or one 1 oz glass of hard liquor (44 mL). General information  Avoid eating more than 2,300 mg of salt a day. If you have hypertension, you may need to reduce your sodium intake to 1,500 mg a day.  Work with your health care provider to maintain a healthy body weight or to lose weight. Ask what an ideal weight is for you.  Get at least 30 minutes of exercise that causes your heart to beat faster (aerobic exercise) most days of the week. Activities may include walking, swimming, or biking.  Work with your health care provider or dietitian to adjust your eating plan to your individual calorie needs. What foods should I eat? Fruits All fresh, dried, or frozen fruit. Canned fruit in natural juice (without added sugar). Vegetables Fresh or frozen  vegetables (raw, steamed, roasted, or grilled). Low-sodium or reduced-sodium tomato and vegetable juice. Low-sodium or reduced-sodium tomato sauce and tomato paste. Low-sodium or reduced-sodium canned vegetables. Grains Whole-grain or whole-wheat bread. Whole-grain or whole-wheat pasta. Brown rice. Orpah Cobb. Bulgur. Whole-grain and low-sodium cereals. Pita bread. Low-fat, low-sodium crackers. Whole-wheat flour tortillas. Meats and other proteins Skinless chicken or [place]. Ground chicken or [place]. Pork with fat trimmed off. Fish and seafood. Egg whites. Dried beans, peas, or lentils. Unsalted nuts, nut butters, and seeds. Unsalted canned beans. Lean cuts of beef with fat trimmed off. Low-sodium, lean precooked or cured meat, such as sausages or meat loaves. Dairy Low-fat (1%) or fat-free (skim) milk. Reduced-fat, low-fat, or fat-free cheeses. Nonfat, low-sodium ricotta or cottage cheese. Low-fat or nonfat yogurt. Low-fat, low-sodium cheese. Fats and oils Soft margarine without trans fats. Vegetable oil. Reduced-fat, low-fat, or light mayonnaise and salad dressings (reduced-sodium). Canola, safflower, olive, avocado, soybean, and sunflower oils. Avocado. Seasonings and condiments Herbs. Spices. Seasoning mixes without salt. Other foods Unsalted popcorn and pretzels. Fat-free sweets. The items listed above may not be a complete list of foods and beverages you can eat. Contact a dietitian for more information. What foods should I avoid? Fruits Canned fruit in a light or heavy syrup. Fried fruit. Fruit in cream or butter sauce. Vegetables Creamed or fried vegetables. Vegetables in a cheese sauce. Regular canned vegetables (not low-sodium or reduced-sodium). Regular canned tomato sauce and paste (not low-sodium or reduced-sodium). Regular tomato and vegetable juice (not low-sodium or reduced-sodium). Rosita Fire. Olives. Grains Baked goods made with fat, such as croissants, muffins, or some  breads. Dry pasta or rice meal packs. Meats and other proteins Fatty cuts of meat. Ribs. Fried meat. Tomasa Blase. Bologna, salami, and other precooked or cured meats, such as sausages or meat loaves. Fat from the back of a pig (fatback). Bratwurst. Salted nuts and seeds. Canned beans with added salt. Canned or smoked fish. Whole eggs or egg yolks. Chicken or [place] with skin. Dairy Whole or 2% milk, cream, and half-and-half. Whole or full-fat cream cheese. Whole-fat or sweetened yogurt. Full-fat cheese. Nondairy creamers. Whipped toppings. Processed cheese and cheese spreads. Fats and oils Butter. Stick margarine. Lard. Shortening. Ghee. Bacon fat. Tropical oils, such as coconut, palm kernel, or palm oil. Seasonings and condiments Onion salt, garlic salt, seasoned salt, table salt, and sea salt. Worcestershire sauce. Tartar sauce. Barbecue sauce. Teriyaki sauce. Soy sauce, including reduced-sodium. Steak sauce. Canned and packaged gravies. Fish sauce. Oyster sauce. Cocktail sauce. Store-bought horseradish. Ketchup. Mustard. Meat flavorings and tenderizers. Bouillon cubes. Hot sauces. Pre-made or packaged marinades. Pre-made or packaged taco seasonings. Relishes. Regular salad dressings. Other foods Salted popcorn and pretzels. The items listed above may not be a complete list of foods and beverages you should avoid. Contact a dietitian for more information. Where to find more information  National Heart, Lung, and Blood Institute: PopSteam.is  American Heart Association: www.heart.org  Academy of Nutrition and  Dietetics: www.eatright.org  National Kidney Foundation: www.kidney.org Summary  The DASH eating plan is a healthy eating plan that has been shown to reduce high blood pressure (hypertension). It may also reduce your risk for type 2 diabetes, heart disease, and stroke.  When on the DASH eating plan, aim to eat more fresh fruits and vegetables, whole grains, lean proteins, low-fat  dairy, and heart-healthy fats.  With the DASH eating plan, you should limit salt (sodium) intake to 2,300 mg a day. If you have hypertension, you may need to reduce your sodium intake to 1,500 mg a day.  Work with your health care provider or dietitian to adjust your eating plan to your individual calorie needs. This information is not intended to replace advice given to you by your health care provider. Make sure you discuss any questions you have with your health care provider. Document Revised: 09/05/2019 Document Reviewed: 09/05/2019 Elsevier Patient Education  2021 [employer].

## 2021-04-21 ENCOUNTER — Other Ambulatory Visit: Payer: Self-pay | Admitting: Family Medicine

## 2021-04-21 DIAGNOSIS — E785 Hyperlipidemia, unspecified: Secondary | ICD-10-CM

## 2021-07-17 ENCOUNTER — Other Ambulatory Visit: Payer: Self-pay | Admitting: Internal Medicine

## 2021-07-17 DIAGNOSIS — E785 Hyperlipidemia, unspecified: Secondary | ICD-10-CM

## 2021-07-23 ENCOUNTER — Other Ambulatory Visit: Payer: Self-pay | Admitting: Internal Medicine

## 2021-07-23 DIAGNOSIS — I10 Essential (primary) hypertension: Secondary | ICD-10-CM

## 2021-08-10 ENCOUNTER — Ambulatory Visit (INDEPENDENT_AMBULATORY_CARE_PROVIDER_SITE_OTHER): Payer: Medicare Other

## 2021-08-10 ENCOUNTER — Telehealth: Payer: Self-pay

## 2021-08-10 ENCOUNTER — Other Ambulatory Visit: Payer: Self-pay

## 2021-08-10 VITALS — Ht 66.5 in | Wt 191.0 lb

## 2021-08-10 DIAGNOSIS — Z0000 Encounter for general adult medical examination without abnormal findings: Secondary | ICD-10-CM | POA: Diagnosis not present

## 2021-08-10 NOTE — Telephone Encounter (Signed)
Called pt for AWV. Has follow up appointment with you on 08/19/2021. Pt asks if she needs to come in for bloodwork prior to that appointment. Thank you!

## 2021-08-10 NOTE — Progress Notes (Signed)
Subjective:   Alexis Ross is a 75 y.o. female who presents for Medicare Annual (Subsequent) preventive examination. Virtual Visit via Telephone Note  I connected with  Alexis Ross on 08/10/21 at 10:20 AM EDT by telephone and verified that I am speaking with the correct person using two identifiers.  Location: Patient: Home  Provider: [facility] Persons participating in the virtual visit: patient/Nurse Health Advisor   I discussed the limitations, risks, security and privacy concerns of performing an evaluation and management service by telephone and the availability of in person appointments. The patient expressed understanding and agreed to proceed.  Interactive audio and video telecommunications were attempted between this nurse and patient, however failed, due to patient having technical difficulties OR patient did not have access to video capability.  We continued and completed visit with audio only.  Some vital signs may be absent or patient reported.   Darral Dash, LPN  Review of Systems     Cardiac Risk Factors include: advanced age (>47men, >38 women);hypertension;dyslipidemia;sedentary lifestyle;obesity (BMI >30kg/m2)     Objective:    Today's Vitals   08/10/21 1427  Weight: 191 lb (86.6 kg)  Height: 5' 6.5" (1.689 m)   Body mass index is 30.37 kg/m.  Advanced Directives 08/10/2021 08/10/2020 01/03/2016 12/19/2013 12/24/2011  Does Patient Have a Medical Advance Directive? No No No Patient does not have advance directive;Patient would not like information Patient does not have advance directive;Patient would not like information  Would patient like information on creating a medical advance directive? No - Patient declined No - Patient declined No - patient declined information - -  Pre-existing out of facility DNR order (yellow form or pink MOST form) - - - - No    Current Medications (verified) Outpatient Encounter Medications as of 08/10/2021   Medication Sig   acetaminophen (TYLENOL) 500 MG tablet Take 1,000 mg by mouth every 6 (six) hours as needed. For pain   atorvastatin (LIPITOR) 40 MG tablet TAKE 1 TABLET BY MOUTH ONCE DAILY IN THE MORNING   clopidogrel (PLAVIX) 75 MG tablet Take 1 tablet (75 mg total) by mouth daily with breakfast.   levothyroxine (SYNTHROID) 88 MCG tablet Take 1 tablet (88 mcg total) by mouth daily before breakfast.   lisinopril-hydrochlorothiazide (ZESTORETIC) 10-12.5 MG tablet Take 1 tablet by mouth once daily   No facility-administered encounter medications on file as of 08/10/2021.    Allergies (verified) Patient has no known allergies.   History: Past Medical History:  Diagnosis Date   Accidental fall 12/19/2013   "tripped over pallet in store; briefly lost consciousness" (12/20/2013)   Anemia    Arthritis    "knees, fingers" (12/19/2013)   Closed fracture of facial bones (HCC) 12/19/2013   Concussion with brief LOC 12/19/2013   Head injury, closed, with concussion 12/19/2013   Hyperlipidemia    Hypothyroidism    Stroke (HCC) 12/2011   residual "left sided weakness" (12/19/2013)   Past Surgical History:  Procedure Laterality Date   NO PAST SURGERIES     No family history on file. Social History   Socioeconomic History   Marital status: Widowed    Spouse name: Not on file   Number of children: 4   Years of education: Not on file   Highest education level: 11th grade  Occupational History   Not on file  Tobacco Use   Smoking status: Former    Packs/day: 0.50    Years: 30.00    Pack years: 15.00  Types: Cigarettes   Smokeless tobacco: Never   Tobacco comments:    12/19/2013 "quit smoking in ~ 1994"  Substance and Sexual Activity   Alcohol use: Yes    Comment: 12/19/2013 "drank a little when I was young"   Drug use: No   Sexual activity: Yes  Other Topics Concern   Not on file  Social History Narrative   Lives alone   4 children: [name]       Enjoys: church,  relaxing, read, watch tv      Diet: eats all foods groups   Caffeine: 1-2 cups coffee   Water: 6-8 cups daily      Wears seat belt-does not drive    Smoke detectors    No weapons   Social Determinants of Corporate investment banker Strain: Low Risk    Difficulty of Paying Living Expenses: Not hard at all  Food Insecurity: No Food Insecurity   Worried About Programme researcher, broadcasting/film/video in the Last Year: Never true   Barista in the Last Year: Never true  Transportation Needs: No Transportation Needs   Lack of Transportation (Medical): No   Lack of Transportation (Non-Medical): No  Physical Activity: Sufficiently Active   Days of Exercise per Week: 5 days   Minutes of Exercise per Session: 30 min  Stress: No Stress Concern Present   Feeling of Stress : Not at all  Social Connections: Moderately Integrated   Frequency of Communication with Friends and Family: More than three times a week   Frequency of Social Gatherings with Friends and Family: More than three times a week   Attends Religious Services: More than 4 times per year   Active Member of [employer] or Organizations: Yes   Attends Banker Meetings: More than 4 times per year   Marital Status: Widowed    Tobacco Counseling Counseling given: Not Answered Tobacco comments: 12/19/2013 "quit smoking in ~ 1994"   Clinical Intake:  Pre-visit preparation completed: Yes  Pain : No/denies pain     BMI - recorded: 30.37 Nutritional Status: BMI > 30  Obese Nutritional Risks: None Diabetes: No  How often do you need to have someone help you when you read instructions, pamphlets, or other written materials from your doctor or pharmacy?: 1 - Never  Diabetic?no  Interpreter Needed?: No  Information entered by :: MJ Alexis Mizuno, LPN   Activities of Daily Living In your present state of health, do you have any difficulty performing the following activities: 08/10/2021 08/10/2020  Hearing? N N  Vision? N N   Difficulty concentrating or making decisions? N N  Walking or climbing stairs? N N  Dressing or bathing? N N  Doing errands, shopping? N N  Preparing Food and eating ? N N  Using the Toilet? N N  In the past six months, have you accidently leaked urine? N N  Do you have problems with loss of bowel control? N N  Managing your Medications? N N  Managing your Finances? N N  Housekeeping or managing your Housekeeping? N N  Some recent data might be hidden    Patient Care Team: Anabel Halon, MD as PCP - General (Internal Medicine)  Indicate any recent Medical Services you may have received from other than [facility] providers in the past year (date may be approximate).     Assessment:   This is a routine wellness examination for [facility].  Hearing/Vision screen Hearing Screening - Comments::  No hearing issues. Vision Screening - Comments:: Readers. Declines eye exam.   Dietary issues and exercise activities discussed: Current Exercise Habits: Home exercise routine, Type of exercise: walking, Time (Minutes): 30, Frequency (Times/Week): 5, Weekly Exercise (Minutes/Week): 150, Intensity: Mild, Exercise limited by: cardiac condition(s)   Goals Addressed             This Visit's Progress    Protect My Health   On track    Why is this important?   Screening tests can find diseases early when they are easier to treat.  Your doctor or nurse will talk with you about which tests are important for you.  Getting shots for common diseases like the flu and shingles will help prevent them.     Notes:        Depression Screen PHQ 2/9 Scores 08/10/2021 02/16/2021 11/19/2020 09/01/2020 08/10/2020 08/10/2020 07/21/2020  PHQ - 2 Score 0 0 0 0 0 0 0  PHQ- 9 Score - - - - - - -    Fall Risk Fall Risk  08/10/2021 02/16/2021 11/19/2020 09/01/2020 08/10/2020  Falls in the past year? 0 0 0 0 0  Number falls in past yr: 0 0 0 0 0  Injury with Fall? 0 0 0 0 0  Risk for fall due to : Impaired  vision;Impaired balance/gait No Fall Risks No Fall Risks No Fall Risks No Fall Risks  Follow up Falls prevention discussed Falls evaluation completed Falls evaluation completed Falls evaluation completed Falls evaluation completed    FALL RISK PREVENTION PERTAINING TO THE HOME:  Any stairs in or around the home? No  If so, are there any without handrails? No  Home free of loose throw rugs in walkways, pet beds, electrical cords, etc? Yes  Adequate lighting in your home to reduce risk of falls? Yes   ASSISTIVE DEVICES UTILIZED TO PREVENT FALLS:  Life alert? No  Use of a cane, walker or w/c? No  Grab bars in the bathroom? Yes  Shower chair or bench in shower? Yes  Elevated toilet seat or a handicapped toilet? Yes   TIMED UP AND GO:  Was the test performed? No . Phone visit   Cognitive Function:     6CIT Screen 08/10/2021 08/10/2020  What Year? 0 points 0 points  What month? 0 points 0 points  What time? 0 points 0 points  Count back from 20 0 points 4 points  Months in reverse 4 points 4 points  Repeat phrase 2 points 0 points  Total Score 6 8    Immunizations Immunization History  Administered Date(s) Administered   Fluad Quad(high Dose 65+) 06/23/2020   Influenza-Unspecified 07/16/2013   Moderna Sars-Covid-2 Vaccination 12/10/2019, 01/07/2020, 09/18/2020   Pneumococcal Polysaccharide-23 12/25/2011    TDAP status: Due, Education has been provided regarding the importance of this vaccine. Advised may receive this vaccine at local pharmacy or Health Dept. Aware to provide a copy of the vaccination record if obtained from local pharmacy or Health Dept. Verbalized acceptance and understanding.  Flu Vaccine status: Due, Education has been provided regarding the importance of this vaccine. Advised may receive this vaccine at local pharmacy or Health Dept. Aware to provide a copy of the vaccination record if obtained from local pharmacy or Health Dept. Verbalized acceptance  and understanding.  Pneumococcal vaccine status: Due, Education has been provided regarding the importance of this vaccine. Advised may receive this vaccine at local pharmacy or Health Dept. Aware to provide a copy of the vaccination  record if obtained from local pharmacy or Health Dept. Verbalized acceptance and understanding.  Covid-19 vaccine status: Completed vaccines  Qualifies for Shingles Vaccine? Yes   Zostavax completed No   Shingrix Completed?: No.    Education has been provided regarding the importance of this vaccine. Patient has been advised to call insurance company to determine out of pocket expense if they have not yet received this vaccine. Advised may also receive vaccine at local pharmacy or Health Dept. Verbalized acceptance and understanding.  Screening Tests Health Maintenance  Topic Date Due   Hepatitis C Screening  Never done   TETANUS/TDAP  Never done   Zoster Vaccines- Shingrix (1 of 2) Never done   Pneumonia Vaccine 53+ Years old (2 - PCV) 12/24/2012   COVID-19 Vaccine (4 - Booster for Moderna series) 11/13/2020   INFLUENZA VACCINE  05/16/2021   DEXA SCAN  Completed   HPV VACCINES  Aged Out    Health Maintenance  Health Maintenance Due  Topic Date Due   Hepatitis C Screening  Never done   TETANUS/TDAP  Never done   Zoster Vaccines- Shingrix (1 of 2) Never done   Pneumonia Vaccine 65+ Years old (2 - PCV) 12/24/2012   COVID-19 Vaccine (4 - Booster for Moderna series) 11/13/2020   INFLUENZA VACCINE  05/16/2021    Colorectal cancer screening: No longer required.   Mammogram status: No longer required due to AGE.  Bone Density status: Completed 718/2018. Results reflect: Bone density results: NORMAL. Repeat every 2 years.  Lung Cancer Screening: (Low Dose CT Chest recommended if Age 68-80 years, 30 pack-year currently smoking OR have quit w/in 15years.) does not qualify.    Additional Screening:  Hepatitis C Screening: does qualify; Completed Not  done  Vision Screening: Recommended annual ophthalmology exams for early detection of glaucoma and other disorders of the eye. Is the patient up to date with their annual eye exam?  No  Who is the provider or what is the name of the office in which the patient attends annual eye exams? Does not have one per pt.  If pt is not established with a provider, would they like to be referred to a provider to establish care? No .   Dental Screening: Recommended annual dental exams for proper oral hygiene  Community Resource Referral / Chronic Care Management: CRR required this visit?  No   CCM required this visit?  No      Plan:     I have personally reviewed and noted the following in the patient's chart:   Medical and social history Use of alcohol, tobacco or illicit drugs  Current medications and supplements including opioid prescriptions.  Functional ability and status Nutritional status Physical activity Advanced directives List of other physicians Hospitalizations, surgeries, and ER visits in previous 12 months Vitals Screenings to include cognitive, depression, and falls Referrals and appointments  In addition, I have reviewed and discussed with patient certain preventive protocols, quality metrics, and best practice recommendations. A written personalized care plan for preventive services as well as general preventive health recommendations were provided to patient.     Darral Dash, LPN   [id number]   Nurse Notes: Pt states she is doing well. Pt declines scheduling bone density at this time. No longer gets mammograms due to age. Discussed shingles vaccine. Has received 4 Covid vaccines. 6 CIT score of 6.

## 2021-08-10 NOTE — Patient Instructions (Signed)
[name] , Thank you for taking time to come for your Medicare Wellness Visit. I appreciate your ongoing commitment to your health goals. Please review the following plan we discussed and let me know if I can assist you in the future.   Screening recommendations/referrals: Colonoscopy: No longer required due to age.  Mammogram: Done 10/20/2003. Bone Density: Done 05/02/2017 Repeat every 2 years  Recommended yearly ophthalmology/optometry visit for glaucoma screening and checkup Recommended yearly dental visit for hygiene and checkup  Vaccinations: Influenza vaccine: Due Repeat annually  Pneumococcal vaccine: Done 12/25/2011 Tdap vaccine: Due Repeat in 10 years  Shingles vaccine: Shingrix discussed. Please contact your pharmacy for coverage information.     Covid-19:Done 12/10/2019, 01/07/2020, 09/18/2020 and 05/11/2021.  Advanced directives: Advance directive discussed with you today. Even though you declined this today, please call our office should you change your mind, and we can give you the proper paperwork for you to fill out.   Conditions/risks identified: Aim for 30 minutes of exercise or brisk walking each day, drink 6-8 glasses of water and eat lots of fruits and vegetables.   Next appointment: Follow up in one year for your annual wellness visit 2023.   Preventive Care 77 Years and Older, Female Preventive care refers to lifestyle choices and visits with your health care provider that can promote health and wellness. What does preventive care include? A yearly physical exam. This is also called an annual well check. Dental exams once or twice a year. Routine eye exams. Ask your health care provider how often you should have your eyes checked. Personal lifestyle choices, including: Daily care of your teeth and gums. Regular physical activity. Eating a healthy diet. Avoiding tobacco and drug use. Limiting alcohol use. Practicing safe sex. Taking low-dose aspirin every  day. Taking vitamin and mineral supplements as recommended by your health care provider. What happens during an annual well check? The services and screenings done by your health care provider during your annual well check will depend on your age, overall health, lifestyle risk factors, and family history of disease. Counseling  Your health care provider may ask you questions about your: Alcohol use. Tobacco use. Drug use. Emotional well-being. Home and relationship well-being. Sexual activity. Eating habits. History of falls. Memory and ability to understand (cognition). Work and work Astronomer. Reproductive health. Screening  You may have the following tests or measurements: Height, weight, and BMI. Blood pressure. Lipid and cholesterol levels. These may be checked every 5 years, or more frequently if you are over 39 years old. Skin check. Lung cancer screening. You may have this screening every year starting at age 36 if you have a 30-pack-year history of smoking and currently smoke or have quit within the past 15 years. Fecal occult blood test (FOBT) of the stool. You may have this test every year starting at age 29. Flexible sigmoidoscopy or colonoscopy. You may have a sigmoidoscopy every 5 years or a colonoscopy every 10 years starting at age 41. Hepatitis C blood test. Hepatitis B blood test. Sexually transmitted disease (STD) testing. Diabetes screening. This is done by checking your blood sugar (glucose) after you have not eaten for a while (fasting). You may have this done every 1-3 years. Bone density scan. This is done to screen for osteoporosis. You may have this done starting at age 25. Mammogram. This may be done every 1-2 years. Talk to your health care provider about how often you should have regular mammograms. Talk with your health care provider  about your test results, treatment options, and if necessary, the need for more tests. Vaccines  Your health care  provider may recommend certain vaccines, such as: Influenza vaccine. This is recommended every year. Tetanus, diphtheria, and acellular pertussis (Tdap, Td) vaccine. You may need a Td booster every 10 years. Zoster vaccine. You may need this after age 12. Pneumococcal 13-valent conjugate (PCV13) vaccine. One dose is recommended after age 66. Pneumococcal polysaccharide (PPSV23) vaccine. One dose is recommended after age 74. Talk to your health care provider about which screenings and vaccines you need and how often you need them. This information is not intended to replace advice given to you by your health care provider. Make sure you discuss any questions you have with your health care provider. Document Released: 10/29/2015 Document Revised: 06/21/2016 Document Reviewed: 08/03/2015 Elsevier Interactive Patient Education  2017 [place] Prevention in the Home Falls can cause injuries. They can happen to people of all ages. There are many things you can do to make your home safe and to help prevent falls. What can I do on the outside of my home? Regularly fix the edges of walkways and driveways and fix any cracks. Remove anything that might make you trip as you walk through a door, such as a raised step or threshold. Trim any bushes or trees on the path to your home. Use bright outdoor lighting. Clear any walking paths of anything that might make someone trip, such as rocks or tools. Regularly check to see if handrails are loose or broken. Make sure that both sides of any steps have handrails. Any raised decks and porches should have guardrails on the edges. Have any leaves, snow, or ice cleared regularly. Use sand or salt on walking paths during winter. Clean up any spills in your garage right away. This includes oil or grease spills. What can I do in the bathroom? Use night lights. Install grab bars by the toilet and in the tub and shower. Do not use towel bars as grab  bars. Use non-skid mats or decals in the tub or shower. If you need to sit down in the shower, use a plastic, non-slip stool. Keep the floor dry. Clean up any water that spills on the floor as soon as it happens. Remove soap buildup in the tub or shower regularly. Attach bath mats securely with double-sided non-slip rug tape. Do not have throw rugs and other things on the floor that can make you trip. What can I do in the bedroom? Use night lights. Make sure that you have a light by your bed that is easy to reach. Do not use any sheets or blankets that are too big for your bed. They should not hang down onto the floor. Have a firm chair that has side arms. You can use this for support while you get dressed. Do not have throw rugs and other things on the floor that can make you trip. What can I do in the kitchen? Clean up any spills right away. Avoid walking on wet floors. Keep items that you use a lot in easy-to-reach places. If you need to reach something above you, use a strong step stool that has a grab bar. Keep electrical cords out of the way. Do not use floor polish or wax that makes floors slippery. If you must use wax, use non-skid floor wax. Do not have throw rugs and other things on the floor that can make you trip. What can I do  with my stairs? Do not leave any items on the stairs. Make sure that there are handrails on both sides of the stairs and use them. Fix handrails that are broken or loose. Make sure that handrails are as long as the stairways. Check any carpeting to make sure that it is firmly attached to the stairs. Fix any carpet that is loose or worn. Avoid having throw rugs at the top or bottom of the stairs. If you do have throw rugs, attach them to the floor with carpet tape. Make sure that you have a light switch at the top of the stairs and the bottom of the stairs. If you do not have them, ask someone to add them for you. What else can I do to help prevent  falls? Wear shoes that: Do not have high heels. Have rubber bottoms. Are comfortable and fit you well. Are closed at the toe. Do not wear sandals. If you use a stepladder: Make sure that it is fully opened. Do not climb a closed stepladder. Make sure that both sides of the stepladder are locked into place. Ask someone to hold it for you, if possible. Clearly mark and make sure that you can see: Any grab bars or handrails. First and last steps. Where the edge of each step is. Use tools that help you move around (mobility aids) if they are needed. These include: Canes. Walkers. Scooters. Crutches. Turn on the lights when you go into a dark area. Replace any light bulbs as soon as they burn out. Set up your furniture so you have a clear path. Avoid moving your furniture around. If any of your floors are uneven, fix them. If there are any pets around you, be aware of where they are. Review your medicines with your doctor. Some medicines can make you feel dizzy. This can increase your chance of falling. Ask your doctor what other things that you can do to help prevent falls. This information is not intended to replace advice given to you by your health care provider. Make sure you discuss any questions you have with your health care provider. Document Released: 07/29/2009 Document Revised: 03/09/2016 Document Reviewed: 11/06/2014 Elsevier Interactive Patient Education  2017 [employer].

## 2021-08-19 ENCOUNTER — Ambulatory Visit (INDEPENDENT_AMBULATORY_CARE_PROVIDER_SITE_OTHER): Payer: Medicare Other | Admitting: Internal Medicine

## 2021-08-19 ENCOUNTER — Encounter: Payer: Self-pay | Admitting: Internal Medicine

## 2021-08-19 ENCOUNTER — Other Ambulatory Visit: Payer: Self-pay

## 2021-08-19 ENCOUNTER — Encounter (INDEPENDENT_AMBULATORY_CARE_PROVIDER_SITE_OTHER): Payer: Self-pay

## 2021-08-19 VITALS — BP 140/76 | HR 75 | Temp 97.8°F | Resp 18 | Ht 66.0 in | Wt 191.1 lb

## 2021-08-19 DIAGNOSIS — E785 Hyperlipidemia, unspecified: Secondary | ICD-10-CM | POA: Diagnosis not present

## 2021-08-19 DIAGNOSIS — Z23 Encounter for immunization: Secondary | ICD-10-CM

## 2021-08-19 DIAGNOSIS — R7303 Prediabetes: Secondary | ICD-10-CM

## 2021-08-19 DIAGNOSIS — I10 Essential (primary) hypertension: Secondary | ICD-10-CM

## 2021-08-19 DIAGNOSIS — Z1159 Encounter for screening for other viral diseases: Secondary | ICD-10-CM | POA: Diagnosis not present

## 2021-08-19 DIAGNOSIS — E039 Hypothyroidism, unspecified: Secondary | ICD-10-CM

## 2021-08-19 DIAGNOSIS — Z8673 Personal history of transient ischemic attack (TIA), and cerebral infarction without residual deficits: Secondary | ICD-10-CM | POA: Diagnosis not present

## 2021-08-19 MED ORDER — ATORVASTATIN CALCIUM 40 MG PO TABS: 40.0000 mg | ORAL_TABLET | Freq: Every morning | ORAL | 1 refills | Status: DC

## 2021-08-19 MED ORDER — LISINOPRIL-HYDROCHLOROTHIAZIDE 10-12.5 MG PO TABS: 1.0000 | ORAL_TABLET | Freq: Every day | ORAL | 1 refills | Status: DC

## 2021-08-19 MED ORDER — CLOPIDOGREL BISULFATE 75 MG PO TABS: 75.0000 mg | ORAL_TABLET | Freq: Every day | ORAL | 1 refills | Status: DC

## 2021-08-19 NOTE — Assessment & Plan Note (Signed)
Lab Results  Component Value Date   TSH 1.930 02/07/2021   On Levothyroxine 88 mcg QD

## 2021-08-19 NOTE — Assessment & Plan Note (Addendum)
BP Readings from Last 1 Encounters:  08/19/21 140/76   Overall well-controlled with Lisinopril-HCTZ Home BP readings reviewed ~ 130s/60s Counseled for compliance with the medications Advised DASH diet and moderate exercise/walking, at least 150 mins/week

## 2021-08-19 NOTE — Assessment & Plan Note (Signed)
Lab Results  Component Value Date   HGBA1C 6.4 (H) 02/07/2021   Advised to follow low carb diet for now She admits she takes sweets, but will reduce it. Check HbA1C

## 2021-08-19 NOTE — Patient Instructions (Signed)
Please continue taking medications as prescribed.  Please continue to follow low salt diet and ambulate as tolerated. 

## 2021-08-19 NOTE — Assessment & Plan Note (Signed)
No focal neurologic deficits On Plavix and statin 

## 2021-08-19 NOTE — Assessment & Plan Note (Signed)
On Atorvastatin 

## 2021-08-19 NOTE — Progress Notes (Signed)
Established Patient Office Visit  Subjective:  Patient ID: Alexis Ross, female    DOB: 02/22/1946  Age: 75 y.o. MRN: [id number]  CC:  Chief Complaint  Patient presents with   Follow-up    6 month follow up needs refill sent on all medications     HPI Alexis Ross is a 75 y.o. female with past medical history of HTN, CVA and HLD who presents for follow up of her chronic medical conditions.  HTN: Her BP was elevated today, but she has not had her medication yet. She has brought BP readings from home, which ranges around 130s/60s. She denies any headache, dizziness, chest pain, dyspnea or palpitations.   Hypothyroidism: TSH wnl now. Denies any constipation, diarrhea, recent weight or appetite change or recent skin or nail changes.   CVA: Takes Plavix and statin. She has stopped taking Aspirin after last visit as instructed. Denies any focal numbness or weakness.  Prediabetes: Her last HbA1C was 6.4. She has cut down sweet intake.  Past Medical History:  Diagnosis Date   Accidental fall 12/19/2013   "tripped over pallet in store; briefly lost consciousness" (12/20/2013)   Anemia    Arthritis    "knees, fingers" (12/19/2013)   Closed fracture of facial bones ([place]) 12/19/2013   Concussion with brief LOC 12/19/2013   Head injury, closed, with concussion 12/19/2013   Hyperlipidemia    Hypothyroidism    Stroke ([place]) 12/2011   residual "left sided weakness" (12/19/2013)    Past Surgical History:  Procedure Laterality Date   NO PAST SURGERIES      History reviewed. No pertinent family history.  Social History   Socioeconomic History   Marital status: Widowed    Spouse name: Not on file   Number of children: 4   Years of education: Not on file   Highest education level: 11th grade  Occupational History   Not on file  Tobacco Use   Smoking status: Former    Packs/day: 0.50    Years: 30.00    Pack years: 15.00    Types: Cigarettes   Smokeless tobacco: Never   Tobacco  comments:    12/19/2013 "quit smoking in ~ 1994"  Substance and Sexual Activity   Alcohol use: Yes    Comment: 12/19/2013 "drank a little when I was young"   Drug use: No   Sexual activity: Yes  Other Topics Concern   Not on file  Social History Narrative   Lives alone   4 children: Sheldon Silvan       Enjoys: church, relaxing, read, watch tv      Diet: eats all foods groups   Caffeine: 1-2 cups coffee   Water: 6-8 cups daily      Wears seat belt-does not drive    Smoke detectors    No weapons   Social Determinants of Radio broadcast assistant Strain: Low Risk    Difficulty of Paying Living Expenses: Not hard at all  Food Insecurity: No Food Insecurity   Worried About Charity fundraiser in the Last Year: Never true   Arboriculturist in the Last Year: Never true  Transportation Needs: No Transportation Needs   Lack of Transportation (Medical): No   Lack of Transportation (Non-Medical): No  Physical Activity: Sufficiently Active   Days of Exercise per Week: 5 days   Minutes of Exercise per Session: 30 min  Stress: No Stress Concern Present   Feeling of Stress :  Not at all  Social Connections: Moderately Integrated   Frequency of Communication with Friends and Family: More than three times a week   Frequency of Social Gatherings with Friends and Family: More than three times a week   Attends Religious Services: More than 4 times per year   Active Member of [employer] or Organizations: Yes   Attends Archivist Meetings: More than 4 times per year   Marital Status: Widowed  Human resources officer Violence: Not At Risk   Fear of Current or Ex-Partner: No   Emotionally Abused: No   Physically Abused: No   Sexually Abused: No    Outpatient Medications Prior to Visit  Medication Sig Dispense Refill   acetaminophen (TYLENOL) 500 MG tablet Take 1,000 mg by mouth every 6 (six) hours as needed. For pain     levothyroxine (SYNTHROID) 88 MCG tablet Take 1 tablet  (88 mcg total) by mouth daily before breakfast. 90 tablet 1   atorvastatin (LIPITOR) 40 MG tablet TAKE 1 TABLET BY MOUTH ONCE DAILY IN THE MORNING 90 tablet 0   clopidogrel (PLAVIX) 75 MG tablet Take 1 tablet (75 mg total) by mouth daily with breakfast. 90 tablet 1   lisinopril-hydrochlorothiazide (ZESTORETIC) 10-12.5 MG tablet Take 1 tablet by mouth once daily 90 tablet 0   No facility-administered medications prior to visit.    No Known Allergies  ROS Review of Systems  Constitutional:  Negative for chills and fever.  HENT:  Negative for congestion, sinus pressure, sinus pain and sore throat.   Eyes:  Negative for pain and discharge.  Respiratory:  Negative for cough and shortness of breath.   Cardiovascular:  Negative for chest pain and palpitations.  Gastrointestinal:  Negative for abdominal pain, constipation, diarrhea, nausea and vomiting.  Endocrine: Negative for polydipsia and polyuria.  Genitourinary:  Negative for dysuria and hematuria.  Musculoskeletal:  Negative for neck pain and neck stiffness.  Skin:  Negative for rash.  Neurological:  Negative for dizziness and weakness.  Psychiatric/Behavioral:  Negative for agitation and behavioral problems.      Objective:    Physical Exam Vitals reviewed.  Constitutional:      General: She is not in acute distress.    Appearance: She is not diaphoretic.  HENT:     Head: Normocephalic and atraumatic.     Nose: Nose normal. No congestion.     Mouth/Throat:     Mouth: Mucous membranes are moist.     Pharynx: No posterior oropharyngeal erythema.  Eyes:     General: No scleral icterus.    Extraocular Movements: Extraocular movements intact.  Cardiovascular:     Rate and Rhythm: Normal rate and regular rhythm.     Pulses: Normal pulses.     Heart sounds: Normal heart sounds. No murmur heard. Pulmonary:     Breath sounds: Normal breath sounds. No wheezing or rales.  Musculoskeletal:     Cervical back: Neck supple. No  tenderness.     Right lower leg: No edema.     Left lower leg: No edema.  Skin:    General: Skin is warm.     Findings: No rash.  Neurological:     General: No focal deficit present.     Mental Status: She is alert and oriented to person, place, and time.     Sensory: No sensory deficit.     Motor: No weakness.  Psychiatric:        Mood and Affect: Mood normal.  Behavior: Behavior normal.    BP 140/76 (BP Location: Right Arm, Cuff Size: Normal)   Pulse 75   Temp 97.8 F (36.6 C) (Oral)   Resp 18   Ht $R'5\' 6"'tj$  (1.676 m)   Wt 191 lb 1.3 oz (86.7 kg)   SpO2 99%   BMI 30.84 kg/m  Wt Readings from Last 3 Encounters:  08/19/21 191 lb 1.3 oz (86.7 kg)  08/10/21 191 lb (86.6 kg)  02/16/21 191 lb 6.4 oz (86.8 kg)    Lab Results  Component Value Date   TSH 1.930 02/07/2021   Lab Results  Component Value Date   WBC 8.5 02/07/2021   HGB 12.1 02/07/2021   HCT 38.3 02/07/2021   MCV 87 02/07/2021   PLT 301 02/07/2021   Lab Results  Component Value Date   NA 137 02/07/2021   K 4.2 02/07/2021   CO2 21 02/07/2021   GLUCOSE 94 02/07/2021   BUN 10 02/07/2021   CREATININE 0.85 02/07/2021   BILITOT 0.3 02/07/2021   ALKPHOS 99 02/07/2021   AST 15 02/07/2021   ALT 12 02/07/2021   PROT 7.3 02/07/2021   ALBUMIN 4.6 02/07/2021   CALCIUM 9.7 02/07/2021   ANIONGAP 8 01/03/2016   EGFR 71 02/07/2021   Lab Results  Component Value Date   CHOL 184 02/07/2021   Lab Results  Component Value Date   HDL 72 02/07/2021   Lab Results  Component Value Date   LDLCALC 98 02/07/2021   Lab Results  Component Value Date   TRIG 75 02/07/2021   Lab Results  Component Value Date   CHOLHDL 2.6 02/07/2021   Lab Results  Component Value Date   HGBA1C 6.4 (H) 02/07/2021      Assessment & Plan:   Problem List Items Addressed This Visit       Cardiovascular and Mediastinum   Essential hypertension - Primary    BP Readings from Last 1 Encounters:  08/19/21 140/76  Overall  well-controlled with Lisinopril-HCTZ Home BP readings reviewed ~ 130s/60s Counseled for compliance with the medications Advised DASH diet and moderate exercise/walking, at least 150 mins/week      Relevant Medications   lisinopril-hydrochlorothiazide (ZESTORETIC) 10-12.5 MG tablet   atorvastatin (LIPITOR) 40 MG tablet   Other Relevant Orders   CMP14+EGFR     Endocrine   Hypothyroidism    Lab Results  Component Value Date   TSH 1.930 02/07/2021  On Levothyroxine 88 mcg QD      Relevant Orders   TSH + free T4     Other   Hyperlipidemia    On Atorvastatin       Relevant Medications   lisinopril-hydrochlorothiazide (ZESTORETIC) 10-12.5 MG tablet   atorvastatin (LIPITOR) 40 MG tablet   History of CVA (cerebrovascular accident)    No focal neurologic deficits On Plavix and statin      Relevant Medications   clopidogrel (PLAVIX) 75 MG tablet   Need for immunization against influenza   Relevant Orders   Flu Vaccine QUAD High Dose(Fluad) (Completed)   Prediabetes    Lab Results  Component Value Date   HGBA1C 6.4 (H) 02/07/2021  Advised to follow low carb diet for now She admits she takes sweets, but will reduce it. Check HbA1C      Relevant Orders   CMP14+EGFR   Hemoglobin A1c   Other Visit Diagnoses     Need for hepatitis C screening test       Relevant Orders   Hepatitis C  Antibody       Meds ordered this encounter  Medications   lisinopril-hydrochlorothiazide (ZESTORETIC) 10-12.5 MG tablet    Sig: Take 1 tablet by mouth daily.    Dispense:  90 tablet    Refill:  1   clopidogrel (PLAVIX) 75 MG tablet    Sig: Take 1 tablet (75 mg total) by mouth daily with breakfast.    Dispense:  90 tablet    Refill:  1   atorvastatin (LIPITOR) 40 MG tablet    Sig: Take 1 tablet (40 mg total) by mouth every morning.    Dispense:  90 tablet    Refill:  1    Follow-up: Return in about 6 months (around 02/16/2022) for Annual physical.    Lindell Spar, MD

## 2021-08-20 LAB — CMP14+EGFR
ALT: 10 IU/L (ref 0–32)
AST: 16 IU/L (ref 0–40)
Albumin/Globulin Ratio: 2 (ref 1.2–2.2)
Albumin: 4.7 g/dL (ref 3.7–4.7)
Alkaline Phosphatase: 83 IU/L (ref 44–121)
BUN/Creatinine Ratio: 13 (ref 12–28)
BUN: 12 mg/dL (ref 8–27)
Bilirubin Total: 0.4 mg/dL (ref 0.0–1.2)
CO2: 24 mmol/L (ref 20–29)
Calcium: 9.9 mg/dL (ref 8.7–10.3)
Chloride: 99 mmol/L (ref 96–106)
Creatinine, Ser: 0.94 mg/dL (ref 0.57–1.00)
Globulin, Total: 2.4 g/dL (ref 1.5–4.5)
Glucose: 87 mg/dL (ref 70–99)
Potassium: 4.5 mmol/L (ref 3.5–5.2)
Sodium: 136 mmol/L (ref 134–144)
Total Protein: 7.1 g/dL (ref 6.0–8.5)
eGFR: 62 mL/min/{1.73_m2} (ref >59)

## 2021-08-20 LAB — HEMOGLOBIN A1C
Est. average glucose Bld gHb Est-mCnc: 137 mg/dL
Hgb A1c MFr Bld: 6.4 % — ABNORMAL HIGH (ref 4.8–5.6)

## 2021-08-20 LAB — HEPATITIS C ANTIBODY: Hep C Virus Ab: <0.1 s/co ratio (ref 0.0–0.9)

## 2021-08-20 LAB — TSH+FREE T4
Free T4: 1.27 ng/dL (ref 0.82–1.77)
TSH: 1.58 u[IU]/mL (ref 0.450–4.500)

## 2021-08-22 ENCOUNTER — Other Ambulatory Visit: Payer: Self-pay | Admitting: Internal Medicine

## 2021-08-22 DIAGNOSIS — I10 Essential (primary) hypertension: Secondary | ICD-10-CM

## 2021-11-06 ENCOUNTER — Other Ambulatory Visit: Payer: Self-pay | Admitting: Internal Medicine

## 2021-11-06 DIAGNOSIS — E039 Hypothyroidism, unspecified: Secondary | ICD-10-CM

## 2022-02-01 ENCOUNTER — Other Ambulatory Visit: Payer: Self-pay | Admitting: Internal Medicine

## 2022-02-01 DIAGNOSIS — E039 Hypothyroidism, unspecified: Secondary | ICD-10-CM

## 2022-02-21 ENCOUNTER — Ambulatory Visit (INDEPENDENT_AMBULATORY_CARE_PROVIDER_SITE_OTHER): Payer: Medicare Other | Admitting: Internal Medicine

## 2022-02-21 ENCOUNTER — Encounter: Payer: Self-pay | Admitting: Internal Medicine

## 2022-02-21 VITALS — BP 134/68 | HR 61 | Resp 18 | Ht 66.0 in | Wt 192.8 lb

## 2022-02-21 DIAGNOSIS — E559 Vitamin D deficiency, unspecified: Secondary | ICD-10-CM

## 2022-02-21 DIAGNOSIS — I10 Essential (primary) hypertension: Secondary | ICD-10-CM

## 2022-02-21 DIAGNOSIS — R7303 Prediabetes: Secondary | ICD-10-CM

## 2022-02-21 DIAGNOSIS — Z0001 Encounter for general adult medical examination with abnormal findings: Secondary | ICD-10-CM | POA: Insufficient documentation

## 2022-02-21 DIAGNOSIS — E785 Hyperlipidemia, unspecified: Secondary | ICD-10-CM | POA: Diagnosis not present

## 2022-02-21 DIAGNOSIS — E039 Hypothyroidism, unspecified: Secondary | ICD-10-CM | POA: Diagnosis not present

## 2022-02-21 DIAGNOSIS — Z23 Encounter for immunization: Secondary | ICD-10-CM | POA: Diagnosis not present

## 2022-02-21 DIAGNOSIS — Z8673 Personal history of transient ischemic attack (TIA), and cerebral infarction without residual deficits: Secondary | ICD-10-CM

## 2022-02-21 MED ORDER — LEVOTHYROXINE SODIUM 88 MCG PO TABS: 88.0000 ug | ORAL_TABLET | Freq: Every day | ORAL | 1 refills | Status: DC

## 2022-02-21 MED ORDER — ATORVASTATIN CALCIUM 40 MG PO TABS: 40.0000 mg | ORAL_TABLET | Freq: Every morning | ORAL | 1 refills | Status: DC

## 2022-02-21 MED ORDER — LISINOPRIL-HYDROCHLOROTHIAZIDE 10-12.5 MG PO TABS: 1.0000 | ORAL_TABLET | Freq: Every day | ORAL | 1 refills | Status: DC

## 2022-02-21 MED ORDER — CLOPIDOGREL BISULFATE 75 MG PO TABS: 75.0000 mg | ORAL_TABLET | Freq: Every day | ORAL | 1 refills | Status: DC

## 2022-02-21 NOTE — Assessment & Plan Note (Signed)
Lab Results  ?Component Value Date  ? TSH 1.580 08/19/2021  ? ?On Levothyroxine 88 mcg QD ?

## 2022-02-21 NOTE — Assessment & Plan Note (Signed)
On Atorvastatin 

## 2022-02-21 NOTE — Progress Notes (Signed)
? ?Established Patient Office Visit ? ?Subjective:  ?Patient ID: Alexis Ross, female    DOB: 07-Nov-1945  Age: 76 y.o. MRN: [id number] ? ?CC:  ?Chief Complaint  ?Patient presents with  ? Annual Exam  ?  Annual exam   ? ? ?HPI ?Alexis Ross is a 76 y.o. female with past medical history of HTN, CVA and HLD who presents for annual physical. ? ?HTN: Her BP was elevated today, but she has not had her medication yet. She has brought BP readings from home, which ranges around 130s/60s. She denies any headache, dizziness, chest pain, dyspnea or palpitations. ?  ?Hypothyroidism: TSH wnl now. Denies any constipation, diarrhea, recent weight or appetite change or recent skin or nail changes. ?  ?CVA: Takes Plavix and statin. She has stopped taking Aspirin as advised. Denies any focal numbness or weakness. ? ?Prediabetes: Her last HbA1C was 6.4. She has cut down sweet intake. ? ?She received PCV20 in the office today. ? ? ?Past Medical History:  ?Diagnosis Date  ? Accidental fall 12/19/2013  ? "tripped over pallet in store; briefly lost consciousness" (12/20/2013)  ? Anemia   ? Arthritis   ? "knees, fingers" (12/19/2013)  ? Closed fracture of facial bones ([place]) 12/19/2013  ? Concussion with brief LOC 12/19/2013  ? Head injury, closed, with concussion 12/19/2013  ? Hyperlipidemia   ? Hypothyroidism   ? Stroke [facility]) 12/2011  ? residual "left sided weakness" (12/19/2013)  ? ? ?Past Surgical History:  ?Procedure Laterality Date  ? NO PAST SURGERIES    ? ? ?History reviewed. No pertinent family history. ? ?Social History  ? ?Socioeconomic History  ? Marital status: Widowed  ?  Spouse name: Not on file  ? Number of children: 4  ? Years of education: Not on file  ? Highest education level: 11th grade  ?Occupational History  ? Not on file  ?Tobacco Use  ? Smoking status: Former  ?  Packs/day: 0.50  ?  Years: 30.00  ?  Pack years: 15.00  ?  Types: Cigarettes  ? Smokeless tobacco: Never  ? Tobacco comments:  ?  12/19/2013 "quit smoking in ~  1994"  ?Substance and Sexual Activity  ? Alcohol use: Yes  ?  Comment: 12/19/2013 "drank a little when I was young"  ? Drug use: No  ? Sexual activity: Yes  ?Other Topics Concern  ? Not on file  ?Social History Narrative  ? Lives alone  ? 4 children: Sheldon Silvan   ?   ? Enjoys: church, relaxing, read, watch tv  ?   ? Diet: eats all foods groups  ? Caffeine: 1-2 cups coffee  ? Water: 6-8 cups daily  ?   ? Wears seat belt-does not drive   ? Smoke detectors   ? No weapons  ? ?Social Determinants of Health  ? ?Financial Resource Strain: Low Risk   ? Difficulty of Paying Living Expenses: Not hard at all  ?Food Insecurity: No Food Insecurity  ? Worried About Charity fundraiser in the Last Year: Never true  ? Ran Out of Food in the Last Year: Never true  ?Transportation Needs: No Transportation Needs  ? Lack of Transportation (Medical): No  ? Lack of Transportation (Non-Medical): No  ?Physical Activity: Sufficiently Active  ? Days of Exercise per Week: 5 days  ? Minutes of Exercise per Session: 30 min  ?Stress: No Stress Concern Present  ? Feeling of Stress : Not at all  ?Social  Connections: Moderately Integrated  ? Frequency of Communication with Friends and Family: More than three times a week  ? Frequency of Social Gatherings with Friends and Family: More than three times a week  ? Attends Religious Services: More than 4 times per year  ? Active Member of Clubs or Organizations: Yes  ? Attends Archivist Meetings: More than 4 times per year  ? Marital Status: Widowed  ?Intimate Partner Violence: Not At Risk  ? Fear of Current or Ex-Partner: No  ? Emotionally Abused: No  ? Physically Abused: No  ? Sexually Abused: No  ? ? ?Outpatient Medications Prior to Visit  ?Medication Sig Dispense Refill  ? acetaminophen (TYLENOL) 500 MG tablet Take 1,000 mg by mouth every 6 (six) hours as needed. For pain    ? atorvastatin (LIPITOR) 40 MG tablet Take 1 tablet (40 mg total) by mouth every morning. 90 tablet  1  ? clopidogrel (PLAVIX) 75 MG tablet Take 1 tablet (75 mg total) by mouth daily with breakfast. 90 tablet 1  ? levothyroxine (SYNTHROID) 88 MCG tablet TAKE 1 TABLET BY MOUTH ONCE DAILY BEFORE BREAKFAST 90 tablet 0  ? lisinopril-hydrochlorothiazide (ZESTORETIC) 10-12.5 MG tablet Take 1 tablet by mouth daily. 90 tablet 1  ? ?No facility-administered medications prior to visit.  ? ? ?No Known Allergies ? ?ROS ?Review of Systems  ?Constitutional:  Negative for chills and fever.  ?HENT:  Negative for congestion, sinus pressure, sinus pain and sore throat.   ?Eyes:  Negative for pain and discharge.  ?Respiratory:  Negative for cough and shortness of breath.   ?Cardiovascular:  Negative for chest pain and palpitations.  ?Gastrointestinal:  Negative for abdominal pain, constipation, diarrhea, nausea and vomiting.  ?Endocrine: Negative for polydipsia and polyuria.  ?Genitourinary:  Negative for dysuria and hematuria.  ?Musculoskeletal:  Negative for neck pain and neck stiffness.  ?Skin:  Negative for rash.  ?Neurological:  Negative for dizziness and weakness.  ?Psychiatric/Behavioral:  Negative for agitation and behavioral problems.   ? ?  ?Objective:  ?  ?Physical Exam ?Vitals reviewed.  ?Constitutional:   ?   General: She is not in acute distress. ?   Appearance: She is obese. She is not diaphoretic.  ?HENT:  ?   Head: Normocephalic and atraumatic.  ?   Nose: Nose normal. No congestion.  ?   Mouth/Throat:  ?   Mouth: Mucous membranes are moist.  ?   Pharynx: No posterior oropharyngeal erythema.  ?Eyes:  ?   General: No scleral icterus. ?   Extraocular Movements: Extraocular movements intact.  ?Neck:  ?   Vascular: No carotid bruit.  ?Cardiovascular:  ?   Rate and Rhythm: Normal rate and regular rhythm.  ?   Pulses: Normal pulses.  ?   Heart sounds: Normal heart sounds. No murmur heard. ?Pulmonary:  ?   Breath sounds: Normal breath sounds. No wheezing or rales.  ?Abdominal:  ?   Palpations: Abdomen is soft.  ?    Tenderness: There is no abdominal tenderness.  ?Musculoskeletal:  ?   Cervical back: Neck supple. No tenderness.  ?   Right lower leg: No edema.  ?   Left lower leg: No edema.  ?Skin: ?   General: Skin is warm.  ?   Findings: No rash.  ?Neurological:  ?   General: No focal deficit present.  ?   Mental Status: She is alert and oriented to person, place, and time.  ?   Cranial Nerves: No cranial  nerve deficit.  ?   Sensory: No sensory deficit.  ?   Motor: No weakness.  ?Psychiatric:     ?   Mood and Affect: Mood normal.     ?   Behavior: Behavior normal.  ? ? ?BP 134/68 (BP Location: Left Arm, Cuff Size: Normal)   Pulse 61   Resp 18   Ht _0  (1.676 m)   Wt 192 lb 12.8 oz (87.5 kg)   SpO2 98%   BMI 31.12 kg/m?  ?Wt Readings from Last 3 Encounters:  ?02/21/22 192 lb 12.8 oz (87.5 kg)  ?08/19/21 191 lb 1.3 oz (86.7 kg)  ?08/10/21 191 lb (86.6 kg)  ? ? ?Lab Results  ?Component Value Date  ? TSH 1.580 08/19/2021  ? ?Lab Results  ?Component Value Date  ? WBC 8.5 02/07/2021  ? HGB 12.1 02/07/2021  ? HCT 38.3 02/07/2021  ? MCV 87 02/07/2021  ? PLT 301 02/07/2021  ? ?Lab Results  ?Component Value Date  ? NA 136 08/19/2021  ? K 4.5 08/19/2021  ? CO2 24 08/19/2021  ? GLUCOSE 87 08/19/2021  ? BUN 12 08/19/2021  ? CREATININE 0.94 08/19/2021  ? BILITOT 0.4 08/19/2021  ? ALKPHOS 83 08/19/2021  ? AST 16 08/19/2021  ? ALT 10 08/19/2021  ? PROT 7.1 08/19/2021  ? ALBUMIN 4.7 08/19/2021  ? CALCIUM 9.9 08/19/2021  ? ANIONGAP 8 01/03/2016  ? EGFR 62 08/19/2021  ? ?Lab Results  ?Component Value Date  ? CHOL 184 02/07/2021  ? ?Lab Results  ?Component Value Date  ? HDL 72 02/07/2021  ? ?Lab Results  ?Component Value Date  ? [place] 98 02/07/2021  ? ?Lab Results  ?Component Value Date  ? TRIG 75 02/07/2021  ? ?Lab Results  ?Component Value Date  ? CHOLHDL 2.6 02/07/2021  ? ?Lab Results  ?Component Value Date  ? HGBA1C 6.4 (H) 08/19/2021  ? ? ?  ?Assessment & Plan:  ? ?Problem List Items Addressed This Visit   ? ?  ? Cardiovascular and  Mediastinum  ? Essential hypertension  ?  BP Readings from Last 1 Encounters:  ?02/21/22 134/68  ?Overall well-controlled with Lisinopril-HCTZ ?Home BP readings reviewed ~ 130s/60s ?Counseled for compliance with the med

## 2022-02-21 NOTE — Assessment & Plan Note (Signed)
BP Readings from Last 1 Encounters:  ?02/21/22 134/68  ? ?Overall well-controlled with Lisinopril-HCTZ ?Home BP readings reviewed ~ 130s/60s ?Counseled for compliance with the medications ?Advised DASH diet and moderate exercise/walking, at least 150 mins/week ?

## 2022-02-21 NOTE — Assessment & Plan Note (Signed)
No focal neurologic deficits On Plavix and statin 

## 2022-02-21 NOTE — Assessment & Plan Note (Signed)

## 2022-02-21 NOTE — Assessment & Plan Note (Signed)
Lab Results  ?Component Value Date  ? HGBA1C 6.4 (H) 08/19/2021  ? ?Advised to follow low carb diet for now ?She admits she takes sweets, but will reduce it. ?Check HbA1C ?

## 2022-02-21 NOTE — Patient Instructions (Signed)
Please continue taking medications as prescribed.  Please continue to follow low salt diet and ambulate as tolerated.  Please consider getting Shingrix and Tdap vaccines at your local pharmacy. 

## 2022-02-22 ENCOUNTER — Other Ambulatory Visit: Payer: Self-pay | Admitting: Internal Medicine

## 2022-02-22 DIAGNOSIS — E559 Vitamin D deficiency, unspecified: Secondary | ICD-10-CM

## 2022-02-22 LAB — CBC WITH DIFFERENTIAL/PLATELET
Basophils Absolute: 0 10*3/uL (ref 0.0–0.2)
Basos: 0 %
EOS (ABSOLUTE): 0.1 10*3/uL (ref 0.0–0.4)
Eos: 1 %
Hematocrit: 37.4 % (ref 34.0–46.6)
Hemoglobin: 12 g/dL (ref 11.1–15.9)
Immature Grans (Abs): 0 10*3/uL (ref 0.0–0.1)
Immature Granulocytes: 0 %
Lymphocytes Absolute: 2.5 10*3/uL (ref 0.7–3.1)
Lymphs: 31 %
MCH: 27.8 pg (ref 26.6–33.0)
MCHC: 32.1 g/dL (ref 31.5–35.7)
MCV: 87 fL (ref 79–97)
Monocytes Absolute: 0.7 10*3/uL (ref 0.1–0.9)
Monocytes: 9 %
Neutrophils Absolute: 4.7 10*3/uL (ref 1.4–7.0)
Neutrophils: 59 %
Platelets: 277 10*3/uL (ref 150–450)
RBC: 4.31 x10E6/uL (ref 3.77–5.28)
RDW: 13.5 % (ref 11.7–15.4)
WBC: 8 10*3/uL (ref 3.4–10.8)

## 2022-02-22 LAB — TSH+FREE T4
Free T4: 1.1 ng/dL (ref 0.82–1.77)
TSH: 2.19 u[IU]/mL (ref 0.450–4.500)

## 2022-02-22 LAB — CMP14+EGFR
ALT: 12 IU/L (ref 0–32)
AST: 14 IU/L (ref 0–40)
Albumin/Globulin Ratio: 1.8 (ref 1.2–2.2)
Albumin: 4.4 g/dL (ref 3.7–4.7)
Alkaline Phosphatase: 78 IU/L (ref 44–121)
BUN/Creatinine Ratio: 13 (ref 12–28)
BUN: 12 mg/dL (ref 8–27)
Bilirubin Total: 0.4 mg/dL (ref 0.0–1.2)
CO2: 23 mmol/L (ref 20–29)
Calcium: 9.6 mg/dL (ref 8.7–10.3)
Chloride: 99 mmol/L (ref 96–106)
Creatinine, Ser: 0.96 mg/dL (ref 0.57–1.00)
Globulin, Total: 2.4 g/dL (ref 1.5–4.5)
Glucose: 85 mg/dL (ref 70–99)
Potassium: 4.3 mmol/L (ref 3.5–5.2)
Sodium: 134 mmol/L (ref 134–144)
Total Protein: 6.8 g/dL (ref 6.0–8.5)
eGFR: 61 mL/min/{1.73_m2} (ref >59)

## 2022-02-22 LAB — LIPID PANEL
Chol/HDL Ratio: 3 ratio (ref 0.0–4.4)
Cholesterol, Total: 186 mg/dL (ref 100–199)
HDL: 63 mg/dL (ref >39)
LDL Chol Calc (NIH): 109 mg/dL — ABNORMAL HIGH (ref 0–99)
Triglycerides: 77 mg/dL (ref 0–149)
VLDL Cholesterol Cal: 14 mg/dL (ref 5–40)

## 2022-02-22 LAB — HEMOGLOBIN A1C
Est. average glucose Bld gHb Est-mCnc: 134 mg/dL
Hgb A1c MFr Bld: 6.3 % — ABNORMAL HIGH (ref 4.8–5.6)

## 2022-02-22 LAB — VITAMIN D 25 HYDROXY (VIT D DEFICIENCY, FRACTURES): Vit D, 25-Hydroxy: 8 ng/mL — ABNORMAL LOW (ref 30.0–100.0)

## 2022-02-22 MED ORDER — VITAMIN D (ERGOCALCIFEROL) 1.25 MG (50000 UNIT) PO CAPS
50000.0000 [IU] | ORAL_CAPSULE | ORAL | 1 refills | Status: DC
Start: 2022-02-22 — End: 2022-07-24

## 2022-04-11 ENCOUNTER — Other Ambulatory Visit: Payer: Self-pay | Admitting: *Deleted

## 2022-04-11 DIAGNOSIS — Z8673 Personal history of transient ischemic attack (TIA), and cerebral infarction without residual deficits: Secondary | ICD-10-CM

## 2022-04-11 MED ORDER — CLOPIDOGREL BISULFATE 75 MG PO TABS: 75.0000 mg | ORAL_TABLET | Freq: Every day | ORAL | 1 refills | Status: DC

## 2022-07-24 ENCOUNTER — Other Ambulatory Visit: Payer: Self-pay | Admitting: Internal Medicine

## 2022-07-24 DIAGNOSIS — E559 Vitamin D deficiency, unspecified: Secondary | ICD-10-CM

## 2022-07-29 ENCOUNTER — Other Ambulatory Visit: Payer: Self-pay | Admitting: Internal Medicine

## 2022-07-29 DIAGNOSIS — E785 Hyperlipidemia, unspecified: Secondary | ICD-10-CM

## 2022-08-15 ENCOUNTER — Encounter: Payer: Medicare Other | Admitting: Internal Medicine

## 2022-08-24 ENCOUNTER — Encounter: Payer: Self-pay | Admitting: Internal Medicine

## 2022-08-24 ENCOUNTER — Ambulatory Visit: Payer: Medicare Other | Admitting: Internal Medicine

## 2022-08-24 ENCOUNTER — Ambulatory Visit (INDEPENDENT_AMBULATORY_CARE_PROVIDER_SITE_OTHER): Payer: Medicare Other | Admitting: Internal Medicine

## 2022-08-24 VITALS — BP 134/72 | HR 94 | Ht 66.0 in | Wt 195.0 lb

## 2022-08-24 DIAGNOSIS — Z8673 Personal history of transient ischemic attack (TIA), and cerebral infarction without residual deficits: Secondary | ICD-10-CM

## 2022-08-24 DIAGNOSIS — I10 Essential (primary) hypertension: Secondary | ICD-10-CM

## 2022-08-24 DIAGNOSIS — Z23 Encounter for immunization: Secondary | ICD-10-CM

## 2022-08-24 DIAGNOSIS — E782 Mixed hyperlipidemia: Secondary | ICD-10-CM | POA: Diagnosis not present

## 2022-08-24 DIAGNOSIS — R7303 Prediabetes: Secondary | ICD-10-CM

## 2022-08-24 DIAGNOSIS — E039 Hypothyroidism, unspecified: Secondary | ICD-10-CM | POA: Diagnosis not present

## 2022-08-24 MED ORDER — CLOPIDOGREL BISULFATE 75 MG PO TABS: 75.0000 mg | ORAL_TABLET | Freq: Every day | ORAL | 3 refills | Status: DC

## 2022-08-24 MED ORDER — LISINOPRIL-HYDROCHLOROTHIAZIDE 10-12.5 MG PO TABS: 1.0000 | ORAL_TABLET | Freq: Every day | ORAL | 3 refills | Status: DC

## 2022-08-24 NOTE — Assessment & Plan Note (Signed)
On Atorvastatin 

## 2022-08-24 NOTE — Assessment & Plan Note (Signed)
BP Readings from Last 1 Encounters:  08/24/22 134/72   Overall well-controlled with Lisinopril-HCTZ Home BP readings reviewed ~ 130s/60s Counseled for compliance with the medications Advised DASH diet and moderate exercise/walking, at least 150 mins/week

## 2022-08-24 NOTE — Assessment & Plan Note (Signed)
Lab Results  Component Value Date   TSH 2.190 02/21/2022   On Levothyroxine 88 mcg QD

## 2022-08-24 NOTE — Progress Notes (Signed)
Established Patient Office Visit  Subjective:  Patient ID: Alexis Ross, female    DOB: 1946/09/01  Age: 76 y.o. MRN: [id number]  CC:  Chief Complaint  Patient presents with   Follow-up    Follow up   Hypertension   Hyperlipidemia    HPI Alexis Ross is a 76 y.o. female with past medical history of HTN, CVA and HLD who presents for follow up of her chronic medical conditions.  HTN: Her BP is well controlled. She has brought BP readings from home, which ranges around 130s/60s. She denies any headache, dizziness, chest pain, dyspnea or palpitations.  Hypothyroidism: TSH wnl now. Denies any constipation, diarrhea, recent weight or appetite change or recent skin or nail changes.   CVA: Takes Plavix and statin. Denies any focal numbness or weakness.  Prediabetes: Her last HbA1C was 6.3. She has cut down sweet intake.  Past Medical History:  Diagnosis Date   Accidental fall 12/19/2013   "tripped over pallet in store; briefly lost consciousness" (12/20/2013)   Anemia    Arthritis    "knees, fingers" (12/19/2013)   Closed fracture of facial bones ([place]) 12/19/2013   Concussion with brief LOC 12/19/2013   Head injury, closed, with concussion 12/19/2013   Hyperlipidemia    Hypothyroidism    Stroke ([place]) 12/2011   residual "left sided weakness" (12/19/2013)    Past Surgical History:  Procedure Laterality Date   NO PAST SURGERIES      History reviewed. No pertinent family history.  Social History   Socioeconomic History   Marital status: Widowed    Spouse name: Not on file   Number of children: 4   Years of education: Not on file   Highest education level: 11th grade  Occupational History   Not on file  Tobacco Use   Smoking status: Former    Packs/day: 0.50    Years: 30.00    Total pack years: 15.00    Types: Cigarettes   Smokeless tobacco: Never   Tobacco comments:    12/19/2013 "quit smoking in ~ 1994"  Substance and Sexual Activity   Alcohol use: Yes    Comment:  12/19/2013 "drank a little when I was young"   Drug use: No   Sexual activity: Yes  Other Topics Concern   Not on file  Social History Narrative   Lives alone   4 children: Sheldon Silvan       Enjoys: church, relaxing, read, watch tv      Diet: eats all foods groups   Caffeine: 1-2 cups coffee   Water: 6-8 cups daily      Wears seat belt-does not drive    Smoke detectors    No weapons   Social Determinants of Health   Financial Resource Strain: Low Risk  (08/10/2021)   Overall Financial Resource Strain (CARDIA)    Difficulty of Paying Living Expenses: Not hard at all  Food Insecurity: No Food Insecurity (08/10/2021)   Hunger Vital Sign    Worried About Running Out of Food in the Last Year: Never true    [place] in the Last Year: Never true  Transportation Needs: No Transportation Needs (08/10/2021)   PRAPARE - Hydrologist (Medical): No    Lack of Transportation (Non-Medical): No  Physical Activity: Sufficiently Active (08/10/2021)   Exercise Vital Sign    Days of Exercise per Week: 5 days    Minutes of Exercise per Session: 30  min  Stress: No Stress Concern Present (08/10/2021)   [place]    Feeling of Stress : Not at all  Social Connections: Moderately Integrated (08/10/2021)   Social Connection and Isolation Panel [NHANES]    Frequency of Communication with Friends and Family: More than three times a week    Frequency of Social Gatherings with Friends and Family: More than three times a week    Attends Religious Services: More than 4 times per year    Active Member of [employer] or Organizations: Yes    Attends Archivist Meetings: More than 4 times per year    Marital Status: Widowed  Intimate Partner Violence: Not At Risk (08/10/2021)   Humiliation, Afraid, Rape, and Kick questionnaire    Fear of Current or Ex-Partner: No    Emotionally  Abused: No    Physically Abused: No    Sexually Abused: No    Outpatient Medications Prior to Visit  Medication Sig Dispense Refill   acetaminophen (TYLENOL) 500 MG tablet Take 1,000 mg by mouth every 6 (six) hours as needed. For pain     atorvastatin (LIPITOR) 40 MG tablet TAKE 1 TABLET BY MOUTH ONCE DAILY IN THE MORNING 90 tablet 0   levothyroxine (SYNTHROID) 88 MCG tablet Take 1 tablet (88 mcg total) by mouth daily before breakfast. 90 tablet 1   Vitamin D, Ergocalciferol, (DRISDOL) 1.25 MG (50000 UNIT) CAPS capsule Take 1 capsule by mouth once a week 12 capsule 0   clopidogrel (PLAVIX) 75 MG tablet Take 1 tablet (75 mg total) by mouth daily with breakfast. 90 tablet 1   lisinopril-hydrochlorothiazide (ZESTORETIC) 10-12.5 MG tablet Take 1 tablet by mouth daily. 90 tablet 1   No facility-administered medications prior to visit.    No Known Allergies  ROS Review of Systems  Constitutional:  Negative for chills and fever.  HENT:  Negative for congestion, sinus pressure, sinus pain and sore throat.   Eyes:  Negative for pain and discharge.  Respiratory:  Negative for cough and shortness of breath.   Cardiovascular:  Negative for chest pain and palpitations.  Gastrointestinal:  Negative for abdominal pain, constipation, diarrhea, nausea and vomiting.  Endocrine: Negative for polydipsia and polyuria.  Genitourinary:  Negative for dysuria and hematuria.  Musculoskeletal:  Negative for neck pain and neck stiffness.  Skin:  Negative for rash.  Neurological:  Negative for dizziness and weakness.  Psychiatric/Behavioral:  Negative for agitation and behavioral problems.       Objective:    Physical Exam Vitals reviewed.  Constitutional:      General: She is not in acute distress.    Appearance: She is not diaphoretic.  HENT:     Head: Normocephalic and atraumatic.     Nose: Nose normal. No congestion.     Mouth/Throat:     Mouth: Mucous membranes are moist.     Pharynx: No  posterior oropharyngeal erythema.  Eyes:     General: No scleral icterus.    Extraocular Movements: Extraocular movements intact.  Cardiovascular:     Rate and Rhythm: Normal rate and regular rhythm.     Pulses: Normal pulses.     Heart sounds: Normal heart sounds. No murmur heard. Pulmonary:     Breath sounds: Normal breath sounds. No wheezing or rales.  Musculoskeletal:     Cervical back: Neck supple. No tenderness.     Right lower leg: No edema.     Left lower leg: No  edema.  Skin:    General: Skin is warm.     Findings: No rash.  Neurological:     General: No focal deficit present.     Mental Status: She is alert and oriented to person, place, and time.     Sensory: No sensory deficit.     Motor: No weakness.  Psychiatric:        Mood and Affect: Mood normal.        Behavior: Behavior normal.     BP 134/72 (BP Location: Left Arm, Patient Position: Sitting, Cuff Size: Normal)   Pulse 94   Ht _0  (1.676 m)   Wt 195 lb (88.5 kg)   SpO2 96%   BMI 31.47 kg/m  Wt Readings from Last 3 Encounters:  08/24/22 195 lb (88.5 kg)  02/21/22 192 lb 12.8 oz (87.5 kg)  08/19/21 191 lb 1.3 oz (86.7 kg)    Lab Results  Component Value Date   TSH 2.190 02/21/2022   Lab Results  Component Value Date   WBC 8.0 02/21/2022   HGB 12.0 02/21/2022   HCT 37.4 02/21/2022   MCV 87 02/21/2022   PLT 277 02/21/2022   Lab Results  Component Value Date   NA 134 02/21/2022   K 4.3 02/21/2022   CO2 23 02/21/2022   GLUCOSE 85 02/21/2022   BUN 12 02/21/2022   CREATININE 0.96 02/21/2022   BILITOT 0.4 02/21/2022   ALKPHOS 78 02/21/2022   AST 14 02/21/2022   ALT 12 02/21/2022   PROT 6.8 02/21/2022   ALBUMIN 4.4 02/21/2022   CALCIUM 9.6 02/21/2022   ANIONGAP 8 01/03/2016   EGFR 61 02/21/2022   Lab Results  Component Value Date   CHOL 186 02/21/2022   Lab Results  Component Value Date   HDL 63 02/21/2022   Lab Results  Component Value Date   LDLCALC 109 (H) 02/21/2022    Lab Results  Component Value Date   TRIG 77 02/21/2022   Lab Results  Component Value Date   CHOLHDL 3.0 02/21/2022   Lab Results  Component Value Date   HGBA1C 6.3 (H) 02/21/2022      Assessment & Plan:   Problem List Items Addressed This Visit       Cardiovascular and Mediastinum   Essential hypertension - Primary    BP Readings from Last 1 Encounters:  08/24/22 134/72  Overall well-controlled with Lisinopril-HCTZ Home BP readings reviewed ~ 130s/60s Counseled for compliance with the medications Advised DASH diet and moderate exercise/walking, at least 150 mins/week      Relevant Medications   lisinopril-hydrochlorothiazide (ZESTORETIC) 10-12.5 MG tablet   Other Relevant Orders   CMP14+EGFR   Hemoglobin A1c     Endocrine   Hypothyroidism    Lab Results  Component Value Date   TSH 2.190 02/21/2022  On Levothyroxine 88 mcg QD      Relevant Orders   TSH + free T4     Other   Hyperlipidemia    On Atorvastatin      Relevant Medications   lisinopril-hydrochlorothiazide (ZESTORETIC) 10-12.5 MG tablet   Other Relevant Orders   Lipid Profile   History of CVA (cerebrovascular accident)    No focal neurologic deficits On Plavix and statin      Relevant Medications   clopidogrel (PLAVIX) 75 MG tablet   Prediabetes    Lab Results  Component Value Date   HGBA1C 6.3 (H) 02/21/2022  Advised to follow low carb diet for now She admits  she takes sweets, but will reduce it. Check HbA1C      Relevant Orders   CMP14+EGFR   Hemoglobin A1c   Other Visit Diagnoses     Need for immunization against influenza       Relevant Orders   Flu Vaccine QUAD High Dose(Fluad) (Completed)       Meds ordered this encounter  Medications   lisinopril-hydrochlorothiazide (ZESTORETIC) 10-12.5 MG tablet    Sig: Take 1 tablet by mouth daily.    Dispense:  90 tablet    Refill:  3   clopidogrel (PLAVIX) 75 MG tablet    Sig: Take 1 tablet (75 mg total) by mouth daily  with breakfast.    Dispense:  90 tablet    Refill:  3    Follow-up: Return in about 6 months (around 02/22/2023) for Annual physical (after 05/09).    Lindell Spar, MD

## 2022-08-24 NOTE — Patient Instructions (Signed)
Please continue taking medications as prescribed.  Please continue to follow low carb diet and ambulate as tolerated.  Please consider getting Shingrix and Tdap vaccine at local pharmacy. 

## 2022-08-24 NOTE — Assessment & Plan Note (Signed)
Lab Results  Component Value Date   HGBA1C 6.3 (H) 02/21/2022   Advised to follow low carb diet for now She admits she takes sweets, but will reduce it. Check HbA1C

## 2022-08-24 NOTE — Assessment & Plan Note (Signed)
No focal neurologic deficits On Plavix and statin

## 2022-08-25 LAB — HEMOGLOBIN A1C
Est. average glucose Bld gHb Est-mCnc: 143 mg/dL
Hgb A1c MFr Bld: 6.6 % — ABNORMAL HIGH (ref 4.8–5.6)

## 2022-08-25 LAB — CMP14+EGFR
ALT: 12 IU/L (ref 0–32)
AST: 16 IU/L (ref 0–40)
Albumin/Globulin Ratio: 1.8 (ref 1.2–2.2)
Albumin: 4.6 g/dL (ref 3.8–4.8)
Alkaline Phosphatase: 75 IU/L (ref 44–121)
BUN/Creatinine Ratio: 11 — ABNORMAL LOW (ref 12–28)
BUN: 11 mg/dL (ref 8–27)
Bilirubin Total: 0.5 mg/dL (ref 0.0–1.2)
CO2: 20 mmol/L (ref 20–29)
Calcium: 9.3 mg/dL (ref 8.7–10.3)
Chloride: 98 mmol/L (ref 96–106)
Creatinine, Ser: 0.96 mg/dL (ref 0.57–1.00)
Globulin, Total: 2.5 g/dL (ref 1.5–4.5)
Glucose: 97 mg/dL (ref 70–99)
Potassium: 3.9 mmol/L (ref 3.5–5.2)
Sodium: 136 mmol/L (ref 134–144)
Total Protein: 7.1 g/dL (ref 6.0–8.5)
eGFR: 61 mL/min/{1.73_m2} (ref >59)

## 2022-08-25 LAB — LIPID PANEL
Chol/HDL Ratio: 2.6 ratio (ref 0.0–4.4)
Cholesterol, Total: 183 mg/dL (ref 100–199)
HDL: 70 mg/dL (ref >39)
LDL Chol Calc (NIH): 99 mg/dL (ref 0–99)
Triglycerides: 74 mg/dL (ref 0–149)
VLDL Cholesterol Cal: 14 mg/dL (ref 5–40)

## 2022-08-25 LAB — TSH+FREE T4
Free T4: 1.07 ng/dL (ref 0.82–1.77)
TSH: 0.996 u[IU]/mL (ref 0.450–4.500)

## 2022-08-28 ENCOUNTER — Ambulatory Visit (INDEPENDENT_AMBULATORY_CARE_PROVIDER_SITE_OTHER): Payer: Medicare Other | Admitting: Internal Medicine

## 2022-08-28 ENCOUNTER — Encounter: Payer: Self-pay | Admitting: Internal Medicine

## 2022-08-28 DIAGNOSIS — Z0000 Encounter for general adult medical examination without abnormal findings: Secondary | ICD-10-CM

## 2022-08-28 NOTE — Progress Notes (Signed)
Subjective:  This is a telephone encounter between Alexis Ross and Alexis Ross on 08/28/2022 for AWV. The visit was conducted with the patient located at home and Alexis Ross at [employer]. The patient's identity was confirmed using their DOB and current address. The patient has consented to being evaluated through a telephone encounter and understands the associated risks (an examination cannot be done and the patient may need to come in for an appointment) / benefits (allows the patient to remain at home, decreasing exposure to coronavirus).     Alexis Ross is a 76 y.o. female who presents for Medicare Annual (Subsequent) preventive examination.  Review of Systems    Review of Systems  All other systems reviewed and are negative.    Objective:    There were no vitals filed for this visit. There is no height or weight on file to calculate BMI.     08/28/2022   11:07 AM 08/10/2021    2:32 PM 08/10/2020    2:10 PM 01/03/2016    6:18 PM 12/19/2013   11:59 PM 12/24/2011    4:11 PM  Advanced Directives  Does Patient Have a Medical Advance Directive? No No No No Patient does not have advance directive;Patient would not like information Patient does not have advance directive;Patient would not like information  Would patient like information on creating a medical advance directive?  No - Patient declined No - Patient declined No - patient declined information    Pre-existing out of facility DNR order (yellow form or pink MOST form)      No    Current Medications (verified) Outpatient Encounter Medications as of 08/28/2022  Medication Sig   acetaminophen (TYLENOL) 500 MG tablet Take 1,000 mg by mouth every 6 (six) hours as needed. For pain   atorvastatin (LIPITOR) 40 MG tablet TAKE 1 TABLET BY MOUTH ONCE DAILY IN THE MORNING   clopidogrel (PLAVIX) 75 MG tablet Take 1 tablet (75 mg total) by mouth daily with breakfast.   levothyroxine (SYNTHROID) 88 MCG tablet Take 1 tablet (88  mcg total) by mouth daily before breakfast.   lisinopril-hydrochlorothiazide (ZESTORETIC) 10-12.5 MG tablet Take 1 tablet by mouth daily.   Vitamin D, Ergocalciferol, (DRISDOL) 1.25 MG (50000 UNIT) CAPS capsule Take 1 capsule by mouth once a week   No facility-administered encounter medications on file as of 08/28/2022.    Allergies (verified) Patient has no known allergies.   History: Past Medical History:  Diagnosis Date   Accidental fall 12/19/2013   "tripped over pallet in store; briefly lost consciousness" (12/20/2013)   Anemia    Arthritis    "knees, fingers" (12/19/2013)   Closed fracture of facial bones (HCC) 12/19/2013   Concussion with brief LOC 12/19/2013   Head injury, closed, with concussion 12/19/2013   Hyperlipidemia    Hypothyroidism    Stroke (HCC) 12/2011   residual "left sided weakness" (12/19/2013)   Past Surgical History:  Procedure Laterality Date   NO PAST SURGERIES     No family history on file. Social History   Socioeconomic History   Marital status: Widowed    Spouse name: Not on file   Number of children: 4   Years of education: Not on file   Highest education level: 11th grade  Occupational History   Not on file  Tobacco Use   Smoking status: Former    Packs/day: 0.50    Years: 30.00    Total pack years: 15.00    Types: Cigarettes  Smokeless tobacco: Never   Tobacco comments:    12/19/2013 "quit smoking in ~ 1994"  Substance and Sexual Activity   Alcohol use: Yes    Comment: 12/19/2013 "drank a little when I was young"   Drug use: No   Sexual activity: Yes  Other Topics Concern   Not on file  Social History Narrative   Lives alone   4 children: [name]       Enjoys: church, relaxing, read, watch tv      Diet: eats all foods groups   Caffeine: 1-2 cups coffee   Water: 6-8 cups daily      Wears seat belt-does not drive    Smoke detectors    No weapons   Social Determinants of Health   Financial Resource Strain: Low  Risk  (08/10/2021)   Overall Financial Resource Strain (CARDIA)    Difficulty of Paying Living Expenses: Not hard at all  Food Insecurity: No Food Insecurity (08/10/2021)   Hunger Vital Sign    Worried About Running Out of Food in the Last Year: Never true    Ran Out of Food in the Last Year: Never true  Transportation Needs: No Transportation Needs (08/10/2021)   PRAPARE - Administrator, Civil Service (Medical): No    Lack of Transportation (Non-Medical): No  Physical Activity: Sufficiently Active (08/10/2021)   Exercise Vital Sign    Days of Exercise per Week: 5 days    Minutes of Exercise per Session: 30 min  Stress: No Stress Concern Present (08/10/2021)   [employer] of Occupational Health - Occupational Stress Questionnaire    Feeling of Stress : Not at all  Social Connections: Moderately Integrated (08/10/2021)   Social Connection and Isolation Panel [NHANES]    Frequency of Communication with Friends and Family: More than three times a week    Frequency of Social Gatherings with Friends and Family: More than three times a week    Attends Religious Services: More than 4 times per year    Active Member of [employer] or Organizations: Yes    Attends Banker Meetings: More than 4 times per year    Marital Status: Widowed    Tobacco Counseling Counseling given: Not Answered Tobacco comments: 12/19/2013 "quit smoking in ~ 1994"   Clinical Intake:  Pre-visit preparation completed: Yes  Pain : No/denies pain     Diabetes: No  How often do you need to have someone help you when you read instructions, pamphlets, or other written materials from your doctor or pharmacy?: 3 - Sometimes What is the last grade level you completed in school?: 9th grade    Activities of Daily Living     No data to display          Patient Care Team: Anabel Halon, MD as PCP - General (Internal Medicine)  Indicate any recent Medical Services you may have  received from other than [facility] providers in the past year (date may be approximate).     Assessment:   This is a routine wellness examination for [facility].  Hearing/Vision screen No results found.  Dietary issues and exercise activities discussed:     Goals Addressed   None    Depression Screen    08/28/2022   11:10 AM 08/24/2022   10:49 AM 02/21/2022    9:52 AM 08/19/2021    9:41 AM 08/10/2021    2:30 PM 02/16/2021    9:48 AM 11/19/2020    9:30  AM  PHQ 2/9 Scores  PHQ - 2 Score 0 0 0 0 0 0 0    Fall Risk    08/28/2022   11:10 AM 08/24/2022   10:48 AM 02/21/2022    9:52 AM 08/19/2021    9:40 AM 08/10/2021    2:33 PM  Fall Risk   Falls in the past year? 0 0 0 0 0  Number falls in past yr: 0 0 0 0 0  Injury with Fall? 0 0 0 0 0  Risk for fall due to :  No Fall Risks No Fall Risks No Fall Risks Impaired vision;Impaired balance/gait  Follow up  Falls evaluation completed Falls evaluation completed Falls evaluation completed Falls prevention discussed    FALL RISK PREVENTION PERTAINING TO THE HOME:  Any stairs in or around the home? No  If so, are there any without handrails? No  Home free of loose throw rugs in walkways, pet beds, electrical cords, etc? Yes  Adequate lighting in your home to reduce risk of falls? Yes   ASSISTIVE DEVICES UTILIZED TO PREVENT FALLS:  Life alert? Yes  Use of a cane, walker or w/c? No  Grab bars in the bathroom? Yes  Shower chair or bench in shower? Yes  Elevated toilet seat or a handicapped toilet? Yes   Cognitive Function:        08/28/2022   11:10 AM 08/10/2021    2:35 PM 08/10/2020    2:15 PM  6CIT Screen  What Year? 0 points 0 points 0 points  What month? 0 points 0 points 0 points  What time? 0 points 0 points 0 points  Count back from 20 0 points 0 points 4 points  Months in reverse 0 points 4 points 4 points  Repeat phrase 0 points 2 points 0 points  Total Score 0 points 6 points 8 points    Immunizations Immunization  History  Administered Date(s) Administered   Fluad Quad(high Dose 65+) 06/23/2020, 08/19/2021, 08/24/2022   Influenza-Unspecified 07/16/2013   Moderna Covid-19 Vaccine Bivalent Booster 51yrs & up 10/25/2021   Moderna Sars-Covid-2 Vaccination 12/10/2019, 01/07/2020, 09/18/2020   PNEUMOCOCCAL CONJUGATE-20 02/21/2022   Pneumococcal Polysaccharide-23 12/25/2011    TDAP status: Due, Education has been provided regarding the importance of this vaccine. Advised may receive this vaccine at local pharmacy or Health Dept. Aware to provide a copy of the vaccination record if obtained from local pharmacy or Health Dept. Verbalized acceptance and understanding.  Flu Vaccine status: Up to date  Pneumococcal vaccine status: Up to date  Covid-19 vaccine status: Completed vaccines  Qualifies for Shingles Vaccine? Yes   Zostavax completed No   Shingrix Completed?: No.    Education has been provided regarding the importance of this vaccine. Patient has been advised to call insurance company to determine out of pocket expense if they have not yet received this vaccine. Advised may also receive vaccine at local pharmacy or Health Dept. Verbalized acceptance and understanding.  Screening Tests Health Maintenance  Topic Date Due   TETANUS/TDAP  Never done   Zoster Vaccines- Shingrix (1 of 2) Never done   COVID-19 Vaccine (5 - Moderna series) 02/22/2022   Medicare Annual Wellness (AWV)  08/10/2022   Pneumonia Vaccine 52+ Years old  Completed   INFLUENZA VACCINE  Completed   DEXA SCAN  Completed   Hepatitis C Screening  Completed   HPV VACCINES  Aged Out    Health Maintenance  Health Maintenance Due  Topic Date Due  TETANUS/TDAP  Never done   Zoster Vaccines- Shingrix (1 of 2) Never done   COVID-19 Vaccine (5 - Moderna series) 02/22/2022   Medicare Annual Wellness (AWV)  08/10/2022    Colorectal cancer screening: No longer required. No records on file, but patient reports negative screening  previously.   Mammogram status: No longer required due to age.  Bone Density status: Completed 05/02/2017. Results reflect: Bone density results: OSTEOPENIA.   Lung Cancer Screening: (Low Dose CT Chest recommended if Age 22-80 years, 30 pack-year currently smoking OR have quit w/in 15years.) does not qualify.    Additional Screening:  Hepatitis C Screening: does not qualify  Vision Screening: Recommended annual ophthalmology exams for early detection of glaucoma and other disorders of the eye. Is the patient up to date with their annual eye exam?  No  Who is the provider or what is the name of the office in which the patient attends annual eye exams?  If pt is not established with a provider, would they like to be referred to a provider to establish care? No .   Dental Screening: Recommended annual dental exams for proper oral hygiene  Community Resource Referral / Chronic Care Management: CRR required this visit?  No   CCM required this visit?  No      Plan:     I have personally reviewed and noted the following in the patient's chart:   Medical and social history Use of alcohol, tobacco or illicit drugs  Current medications and supplements including opioid prescriptions. Patient is not currently taking opioid prescriptions. Functional ability and status Nutritional status Physical activity Advanced directives List of other physicians Hospitalizations, surgeries, and ER visits in previous 12 months Vitals Screenings to include cognitive, depression, and falls Referrals and appointments  In addition, I have reviewed and discussed with patient certain preventive protocols, quality metrics, and best practice recommendations. A written personalized care plan for preventive services as well as general preventive health recommendations were provided to patient.     Alexis BanisterJeffrey Josefine Fuhr, MD   08/28/2022

## 2022-08-28 NOTE — Patient Instructions (Signed)
  Alexis Ross , Thank you for taking time to come for your Medicare Wellness Visit. I appreciate your ongoing commitment to your health goals. Please review the following plan we discussed and let me know if I can assist you in the future.   These are the goals we discussed: No specific goals. She would like to enjoy her time with her grandchildren and great grandchildren.   This is a list of the screening recommended for you and due dates:  Health Maintenance  Topic Date Due   Zoster (Shingles) Vaccine (1 of 2) Never done   Medicare Annual Wellness Visit  08/10/2022   Tetanus Vaccine  08/29/2023*   COVID-19 Vaccine (5 - Moderna series) 10/25/2022   Pneumonia Vaccine  Completed   Flu Shot  Completed   DEXA scan (bone density measurement)  Completed   Hepatitis C Screening: USPSTF Recommendation to screen - Ages 64-79 yo.  Completed   HPV Vaccine  Aged Out  *Topic was postponed. The date shown is not the original due date.

## 2022-10-24 ENCOUNTER — Other Ambulatory Visit: Payer: Self-pay | Admitting: Internal Medicine

## 2022-10-24 DIAGNOSIS — E785 Hyperlipidemia, unspecified: Secondary | ICD-10-CM

## 2022-10-26 ENCOUNTER — Other Ambulatory Visit: Payer: Self-pay | Admitting: Internal Medicine

## 2022-10-26 DIAGNOSIS — E559 Vitamin D deficiency, unspecified: Secondary | ICD-10-CM

## 2022-11-01 ENCOUNTER — Other Ambulatory Visit: Payer: Self-pay | Admitting: Internal Medicine

## 2022-11-01 DIAGNOSIS — E039 Hypothyroidism, unspecified: Secondary | ICD-10-CM

## 2023-01-26 ENCOUNTER — Other Ambulatory Visit: Payer: Self-pay | Admitting: Internal Medicine

## 2023-01-26 DIAGNOSIS — E039 Hypothyroidism, unspecified: Secondary | ICD-10-CM

## 2023-03-01 ENCOUNTER — Ambulatory Visit (INDEPENDENT_AMBULATORY_CARE_PROVIDER_SITE_OTHER): Payer: Medicare Other | Admitting: Internal Medicine

## 2023-03-01 ENCOUNTER — Encounter: Payer: Self-pay | Admitting: Internal Medicine

## 2023-03-01 VITALS — BP 134/60 | Ht 66.0 in | Wt 195.6 lb

## 2023-03-01 DIAGNOSIS — Z0001 Encounter for general adult medical examination with abnormal findings: Secondary | ICD-10-CM | POA: Diagnosis not present

## 2023-03-01 DIAGNOSIS — E039 Hypothyroidism, unspecified: Secondary | ICD-10-CM

## 2023-03-01 DIAGNOSIS — E782 Mixed hyperlipidemia: Secondary | ICD-10-CM

## 2023-03-01 DIAGNOSIS — E1169 Type 2 diabetes mellitus with other specified complication: Secondary | ICD-10-CM

## 2023-03-01 DIAGNOSIS — E119 Type 2 diabetes mellitus without complications: Secondary | ICD-10-CM | POA: Insufficient documentation

## 2023-03-01 DIAGNOSIS — E559 Vitamin D deficiency, unspecified: Secondary | ICD-10-CM

## 2023-03-01 DIAGNOSIS — I10 Essential (primary) hypertension: Secondary | ICD-10-CM | POA: Diagnosis not present

## 2023-03-01 DIAGNOSIS — Z7984 Long term (current) use of oral hypoglycemic drugs: Secondary | ICD-10-CM | POA: Diagnosis not present

## 2023-03-01 NOTE — Patient Instructions (Signed)
Please continue to take medications as prescribed. ? ?Please continue to follow low carb diet and perform moderate exercise/walking at least 150 mins/week. ?

## 2023-03-01 NOTE — Progress Notes (Signed)
Established Patient Office Visit  Subjective:  Patient ID: Alexis Ross, female    DOB: 1946/05/13  Age: 77 y.o. MRN: [id number]  CC:  Chief Complaint  Patient presents with   Annual Exam    HPI Alexis Ross is a 77 y.o. female with past medical history of HTN, CVA and HLD who presents for annual physical.  HTN: Her BP was wnl today. She has brought BP readings from home, which ranges around 130s/60s. She denies any headache, dizziness, chest pain, dyspnea or palpitations.  Hypothyroidism: TSH wnl now. Denies any constipation, diarrhea, recent weight or appetite change or recent skin or nail changes.   CVA: Takes Plavix and statin. She has stopped taking Aspirin as advised. Denies any focal numbness or weakness.  Type II DM with HLD: Her last HbA1C was 6.6. She has cut down sweet intake.  She denies any polyuria or polyphagia currently.   Past Medical History:  Diagnosis Date   Accidental fall 12/19/2013   "tripped over pallet in store; briefly lost consciousness" (12/20/2013)   Anemia    Arthritis    "knees, fingers" (12/19/2013)   Closed fracture of facial bones (HCC) 12/19/2013   Concussion with brief LOC 12/19/2013   Head injury, closed, with concussion 12/19/2013   Hyperlipidemia    Hypothyroidism    Stroke (HCC) 12/2011   residual "left sided weakness" (12/19/2013)    Past Surgical History:  Procedure Laterality Date   NO PAST SURGERIES      History reviewed. No pertinent family history.  Social History   Socioeconomic History   Marital status: Widowed    Spouse name: Not on file   Number of children: 4   Years of education: Not on file   Highest education level: 11th grade  Occupational History   Not on file  Tobacco Use   Smoking status: Former    Packs/day: 0.50    Years: 30.00    Additional pack years: 0.00    Total pack years: 15.00    Types: Cigarettes   Smokeless tobacco: Never   Tobacco comments:    12/19/2013 "quit smoking in ~ 1994"   Substance and Sexual Activity   Alcohol use: Yes    Comment: 12/19/2013 "drank a little when I was young"   Drug use: No   Sexual activity: Yes  Other Topics Concern   Not on file  Social History Narrative   Lives alone   4 children: Alexis Ross       Enjoys: church, relaxing, read, watch tv      Diet: eats all foods groups   Caffeine: 1-2 cups coffee   Water: 6-8 cups daily      Wears seat belt-does not drive    Smoke detectors    No weapons   Social Determinants of Health   Financial Resource Strain: Low Risk  (08/10/2021)   Overall Financial Resource Strain (CARDIA)    Difficulty of Paying Living Expenses: Not hard at all  Food Insecurity: No Food Insecurity (08/10/2021)   Hunger Vital Sign    Worried About Running Out of Food in the Last Year: Never true    Ran Out of Food in the Last Year: Never true  Transportation Needs: No Transportation Needs (08/10/2021)   PRAPARE - Administrator, Civil Service (Medical): No    Lack of Transportation (Non-Medical): No  Physical Activity: Sufficiently Active (08/10/2021)   Exercise Vital Sign    Days of Exercise per  Week: 5 days    Minutes of Exercise per Session: 30 min  Stress: No Stress Concern Present (08/10/2021)   [employer] of Occupational Health - Occupational Stress Questionnaire    Feeling of Stress : Not at all  Social Connections: Moderately Integrated (08/10/2021)   Social Connection and Isolation Panel [NHANES]    Frequency of Communication with Friends and Family: More than three times a week    Frequency of Social Gatherings with Friends and Family: More than three times a week    Attends Religious Services: More than 4 times per year    Active Member of [employer] or Organizations: Yes    Attends Banker Meetings: More than 4 times per year    Marital Status: Widowed  Intimate Partner Violence: Not At Risk (08/10/2021)   Humiliation, Afraid, Rape, and Kick  questionnaire    Fear of Current or Ex-Partner: No    Emotionally Abused: No    Physically Abused: No    Sexually Abused: No    Outpatient Medications Prior to Visit  Medication Sig Dispense Refill   acetaminophen (TYLENOL) 500 MG tablet Take 1,000 mg by mouth every 6 (six) hours as needed. For pain     atorvastatin (LIPITOR) 40 MG tablet TAKE 1 TABLET BY MOUTH ONCE DAILY IN THE MORNING 90 tablet 0   clopidogrel (PLAVIX) 75 MG tablet Take 1 tablet (75 mg total) by mouth daily with breakfast. 90 tablet 3   levothyroxine (SYNTHROID) 88 MCG tablet TAKE 1 TABLET BY MOUTH ONCE DAILY BEFORE BREAKFAST 90 tablet 0   lisinopril-hydrochlorothiazide (ZESTORETIC) 10-12.5 MG tablet Take 1 tablet by mouth daily. 90 tablet 3   Vitamin D, Ergocalciferol, (DRISDOL) 1.25 MG (50000 UNIT) CAPS capsule Take 1 capsule by mouth once a week 12 capsule 1   No facility-administered medications prior to visit.    No Known Allergies  ROS Review of Systems  Constitutional:  Negative for chills and fever.  HENT:  Negative for congestion, sinus pressure, sinus pain and sore throat.   Eyes:  Negative for pain and discharge.  Respiratory:  Negative for cough and shortness of breath.   Cardiovascular:  Negative for chest pain and palpitations.  Gastrointestinal:  Negative for abdominal pain, constipation, diarrhea, nausea and vomiting.  Endocrine: Negative for polydipsia and polyuria.  Genitourinary:  Negative for dysuria and hematuria.  Musculoskeletal:  Negative for neck pain and neck stiffness.  Skin:  Negative for rash.  Neurological:  Negative for dizziness and weakness.  Psychiatric/Behavioral:  Negative for agitation and behavioral problems.       Objective:    Physical Exam Vitals reviewed.  Constitutional:      General: She is not in acute distress.    Appearance: She is obese. She is not diaphoretic.  HENT:     Head: Normocephalic and atraumatic.     Nose: Nose normal. No congestion.      Mouth/Throat:     Mouth: Mucous membranes are moist.     Pharynx: No posterior oropharyngeal erythema.  Eyes:     General: No scleral icterus.    Extraocular Movements: Extraocular movements intact.  Neck:     Vascular: No carotid bruit.  Cardiovascular:     Rate and Rhythm: Normal rate and regular rhythm.     Heart sounds: Normal heart sounds. No murmur heard. Pulmonary:     Breath sounds: Normal breath sounds. No wheezing or rales.  Abdominal:     Palpations: Abdomen is soft.  Tenderness: There is no abdominal tenderness.  Musculoskeletal:     Cervical back: Neck supple. No tenderness.     Right lower leg: No edema.     Left lower leg: No edema.  Skin:    General: Skin is warm.     Findings: No rash.     Comments: Skin tags over neck area  Neurological:     General: No focal deficit present.     Mental Status: She is alert and oriented to person, place, and time.     Cranial Nerves: No cranial nerve deficit.     Sensory: No sensory deficit.     Motor: No weakness.  Psychiatric:        Mood and Affect: Mood normal.        Behavior: Behavior normal.     BP 134/60 (BP Location: Left Arm)   Ht 5\' 6"  (1.676 m)   Wt 195 lb 9.6 oz (88.7 kg)   SpO2 95%   BMI 31.57 kg/m  Wt Readings from Last 3 Encounters:  03/01/23 195 lb 9.6 oz (88.7 kg)  08/24/22 195 lb (88.5 kg)  02/21/22 192 lb 12.8 oz (87.5 kg)    Lab Results  Component Value Date   TSH 0.996 08/24/2022   Lab Results  Component Value Date   WBC 8.0 02/21/2022   HGB 12.0 02/21/2022   HCT 37.4 02/21/2022   MCV 87 02/21/2022   PLT 277 02/21/2022   Lab Results  Component Value Date   NA 136 08/24/2022   K 3.9 08/24/2022   CO2 20 08/24/2022   GLUCOSE 97 08/24/2022   BUN 11 08/24/2022   CREATININE 0.96 08/24/2022   BILITOT 0.5 08/24/2022   ALKPHOS 75 08/24/2022   AST 16 08/24/2022   ALT 12 08/24/2022   PROT 7.1 08/24/2022   ALBUMIN 4.6 08/24/2022   CALCIUM 9.3 08/24/2022   ANIONGAP 8 01/03/2016    EGFR 61 08/24/2022   Lab Results  Component Value Date   CHOL 183 08/24/2022   Lab Results  Component Value Date   HDL 70 08/24/2022   Lab Results  Component Value Date   LDLCALC 99 08/24/2022   Lab Results  Component Value Date   TRIG 74 08/24/2022   Lab Results  Component Value Date   CHOLHDL 2.6 08/24/2022   Lab Results  Component Value Date   HGBA1C 6.6 (H) 08/24/2022      Assessment & Plan:   Problem List Items Addressed This Visit       Cardiovascular and Mediastinum   Essential hypertension    BP Readings from Last 1 Encounters:  03/01/23 134/60  Overall well-controlled with Lisinopril-HCTZ Home BP readings reviewed ~ 130s/60s Counseled for compliance with the medications Advised DASH diet and moderate exercise/walking, at least 150 mins/week      Relevant Orders   CMP14+EGFR   CBC with Differential/Platelet     Endocrine   Hypothyroidism    Lab Results  Component Value Date   TSH 0.996 08/24/2022  On Levothyroxine 88 mcg QD      Relevant Orders   TSH + free T4   Diabetes mellitus (HCC)    Lab Results  Component Value Date   HGBA1C 6.6 (H) 08/24/2022   New onset Diet modification was advised, plan to start Metformin if HbA1C stays above 6.5 Advised to follow diabetic diet On ACEi and statin F/u CMP and lipid panel Diabetic foot exam: Today Diabetic eye exam: Advised to follow up with Ophthalmology  for diabetic eye exam       Relevant Orders   Hemoglobin A1c   CMP14+EGFR   Urine Microalbumin w/creat. ratio     Other   Hyperlipidemia   Relevant Orders   Lipid panel   Encounter for general adult medical examination with abnormal findings - Primary    Physical exam as documented. Counseling done  re healthy lifestyle involving commitment to 150 minutes exercise per week, heart healthy diet, and attaining healthy weight.The importance of adequate sleep also discussed. Changes in health habits are decided on by the patient with  goals and time frames  set for achieving them. Immunization and cancer screening needs are specifically addressed at this visit.      Vitamin D deficiency    Last vitamin D Lab Results  Component Value Date   VD25OH 8.0 (L) 02/21/2022  On vitamin D supplement      Relevant Orders   VITAMIN D 25 Hydroxy (Vit-D Deficiency, Fractures)    No orders of the defined types were placed in this encounter.   Follow-up: Return in about 4 months (around 07/02/2023) for DM and HTN.    Anabel Halon, MD

## 2023-03-01 NOTE — Assessment & Plan Note (Signed)

## 2023-03-01 NOTE — Assessment & Plan Note (Signed)
Last vitamin D Lab Results  Component Value Date   VD25OH 8.0 (L) 02/21/2022   On vitamin D supplement

## 2023-03-01 NOTE — Assessment & Plan Note (Addendum)
Lab Results  Component Value Date   HGBA1C 6.6 (H) 08/24/2022   New onset Diet modification was advised, plan to start Metformin if HbA1C stays above 6.5 Advised to follow diabetic diet On ACEi and statin F/u CMP and lipid panel Diabetic foot exam: Today Diabetic eye exam: Advised to follow up with Ophthalmology for diabetic eye exam

## 2023-03-01 NOTE — Assessment & Plan Note (Signed)
BP Readings from Last 1 Encounters:  03/01/23 134/60   Overall well-controlled with Lisinopril-HCTZ Home BP readings reviewed ~ 130s/60s Counseled for compliance with the medications Advised DASH diet and moderate exercise/walking, at least 150 mins/week

## 2023-03-01 NOTE — Assessment & Plan Note (Signed)
Lab Results  Component Value Date   TSH 0.996 08/24/2022   On Levothyroxine 88 mcg QD

## 2023-03-02 ENCOUNTER — Other Ambulatory Visit: Payer: Self-pay | Admitting: Internal Medicine

## 2023-03-02 DIAGNOSIS — E039 Hypothyroidism, unspecified: Secondary | ICD-10-CM

## 2023-03-02 LAB — CMP14+EGFR
BUN: 11 mg/dL (ref 8–27)
Calcium: 9.8 mg/dL (ref 8.7–10.3)
Creatinine, Ser: 0.9 mg/dL (ref 0.57–1.00)
Globulin, Total: 2.7 g/dL (ref 1.5–4.5)
Glucose: 82 mg/dL (ref 70–99)
eGFR: 65 mL/min/{1.73_m2} (ref >59)

## 2023-03-02 LAB — CBC WITH DIFFERENTIAL/PLATELET
Eos: 1 %
Hemoglobin: 12.2 g/dL (ref 11.1–15.9)
Immature Grans (Abs): 0 10*3/uL (ref 0.0–0.1)
Lymphs: 31 %
Monocytes: 8 %
Neutrophils Absolute: 5.4 10*3/uL (ref 1.4–7.0)
WBC: 9 10*3/uL (ref 3.4–10.8)

## 2023-03-02 LAB — MICROALBUMIN / CREATININE URINE RATIO

## 2023-03-02 LAB — LIPID PANEL
Cholesterol, Total: 189 mg/dL (ref 100–199)
VLDL Cholesterol Cal: 16 mg/dL (ref 5–40)

## 2023-03-02 LAB — TSH+FREE T4: TSH: 5.22 u[IU]/mL — ABNORMAL HIGH (ref 0.450–4.500)

## 2023-03-02 MED ORDER — LEVOTHYROXINE SODIUM 100 MCG PO TABS: 100.0000 ug | ORAL_TABLET | Freq: Every day | ORAL | 1 refills | Status: DC

## 2023-03-03 LAB — CMP14+EGFR
ALT: 13 IU/L (ref 0–32)
AST: 17 IU/L (ref 0–40)
Albumin/Globulin Ratio: 1.7 (ref 1.2–2.2)
Albumin: 4.5 g/dL (ref 3.8–4.8)
Alkaline Phosphatase: 85 IU/L (ref 44–121)
BUN/Creatinine Ratio: 12 (ref 12–28)
Bilirubin Total: 0.3 mg/dL (ref 0.0–1.2)
CO2: 21 mmol/L (ref 20–29)
Chloride: 102 mmol/L (ref 96–106)
Potassium: 4.4 mmol/L (ref 3.5–5.2)
Sodium: 140 mmol/L (ref 134–144)
Total Protein: 7.2 g/dL (ref 6.0–8.5)

## 2023-03-03 LAB — CBC WITH DIFFERENTIAL/PLATELET
Basophils Absolute: 0 10*3/uL (ref 0.0–0.2)
Basos: 0 %
EOS (ABSOLUTE): 0.1 10*3/uL (ref 0.0–0.4)
Hematocrit: 37.6 % (ref 34.0–46.6)
Immature Granulocytes: 0 %
Lymphocytes Absolute: 2.8 10*3/uL (ref 0.7–3.1)
MCH: 28.4 pg (ref 26.6–33.0)
MCHC: 32.4 g/dL (ref 31.5–35.7)
MCV: 87 fL (ref 79–97)
Monocytes Absolute: 0.7 10*3/uL (ref 0.1–0.9)
Neutrophils: 60 %
Platelets: 280 10*3/uL (ref 150–450)
RBC: 4.3 x10E6/uL (ref 3.77–5.28)
RDW: 12.9 % (ref 11.7–15.4)

## 2023-03-03 LAB — VITAMIN D 25 HYDROXY (VIT D DEFICIENCY, FRACTURES): Vit D, 25-Hydroxy: 44.9 ng/mL (ref 30.0–100.0)

## 2023-03-03 LAB — MICROALBUMIN / CREATININE URINE RATIO
Microalb/Creat Ratio: <7 mg/g creat (ref 0–29)
Microalbumin, Urine: <3 ug/mL

## 2023-03-03 LAB — HEMOGLOBIN A1C
Est. average glucose Bld gHb Est-mCnc: 140 mg/dL
Hgb A1c MFr Bld: 6.5 % — ABNORMAL HIGH (ref 4.8–5.6)

## 2023-03-03 LAB — LIPID PANEL
Chol/HDL Ratio: 2.9 ratio (ref 0.0–4.4)
HDL: 66 mg/dL (ref >39)
LDL Chol Calc (NIH): 107 mg/dL — ABNORMAL HIGH (ref 0–99)
Triglycerides: 86 mg/dL (ref 0–149)

## 2023-03-03 LAB — TSH+FREE T4: Free T4: 0.95 ng/dL (ref 0.82–1.77)

## 2023-04-04 ENCOUNTER — Other Ambulatory Visit: Payer: Self-pay | Admitting: Internal Medicine

## 2023-04-04 DIAGNOSIS — E559 Vitamin D deficiency, unspecified: Secondary | ICD-10-CM

## 2023-04-13 ENCOUNTER — Other Ambulatory Visit: Payer: Self-pay | Admitting: Internal Medicine

## 2023-04-13 DIAGNOSIS — E785 Hyperlipidemia, unspecified: Secondary | ICD-10-CM

## 2023-06-29 ENCOUNTER — Other Ambulatory Visit: Payer: Self-pay | Admitting: Internal Medicine

## 2023-06-29 DIAGNOSIS — E785 Hyperlipidemia, unspecified: Secondary | ICD-10-CM

## 2023-07-03 ENCOUNTER — Encounter: Payer: Self-pay | Admitting: Internal Medicine

## 2023-07-03 ENCOUNTER — Ambulatory Visit (INDEPENDENT_AMBULATORY_CARE_PROVIDER_SITE_OTHER): Payer: Medicare Other | Admitting: Internal Medicine

## 2023-07-03 VITALS — BP 136/64 | HR 79 | Ht 66.0 in | Wt 193.4 lb

## 2023-07-03 DIAGNOSIS — E782 Mixed hyperlipidemia: Secondary | ICD-10-CM | POA: Diagnosis not present

## 2023-07-03 DIAGNOSIS — E1169 Type 2 diabetes mellitus with other specified complication: Secondary | ICD-10-CM

## 2023-07-03 DIAGNOSIS — Z23 Encounter for immunization: Secondary | ICD-10-CM | POA: Diagnosis not present

## 2023-07-03 DIAGNOSIS — E039 Hypothyroidism, unspecified: Secondary | ICD-10-CM

## 2023-07-03 DIAGNOSIS — I10 Essential (primary) hypertension: Secondary | ICD-10-CM | POA: Diagnosis not present

## 2023-07-03 DIAGNOSIS — R221 Localized swelling, mass and lump, neck: Secondary | ICD-10-CM | POA: Diagnosis not present

## 2023-07-03 NOTE — Assessment & Plan Note (Addendum)
Lab Results  Component Value Date   TSH 5.220 (H) 03/01/2023   On Levothyroxine 100 mcg QD, dose was increased from 88 mcg after last TSH check

## 2023-07-03 NOTE — Assessment & Plan Note (Addendum)
On Atorvastatin 40 mg QD LDL not at goal, needs to follow low carb diet

## 2023-07-03 NOTE — Assessment & Plan Note (Addendum)
BP Readings from Last 1 Encounters:  07/03/23 136/64   Overall well-controlled with Lisinopril-HCTZ Home BP readings reviewed ~ 130s/60s Counseled for compliance with the medications Advised DASH diet and moderate exercise/walking, at least 150 mins/week

## 2023-07-03 NOTE — Progress Notes (Signed)
Established Patient Office Visit  Subjective:  Patient ID: Alexis Ross, female    DOB: 23-Mar-1946  Age: 77 y.o. MRN: [id number]  CC:  Chief Complaint  Patient presents with   Diabetes    Follow up    Hypertension    Follow up    Cyst    Knot on patient neck     HPI Alexis Ross is a 77 y.o. female with past medical history of HTN, CVA and HLD who presents for follow up of her chronic medical conditions.  HTN: Her BP was wnl today. She has brought BP readings from home, which ranges around 130s/60s. She denies any headache, dizziness, chest pain, dyspnea or palpitations.   Hypothyroidism: TSH was elevated in 05/24. Her dose of levothyroxine was increased to 100 mcg QD from 88 mcg.  Denies any constipation, diarrhea, recent weight or appetite change or recent skin or nail changes.  CVA: Takes Plavix and statin. She has stopped taking Aspirin as advised. Denies any focal numbness or weakness.  Type II DM with HLD: Her last HbA1C was 6.5. She has cut down sweet intake.  She denies any polyuria or polyphagia currently.  Neck mass: She has noticed a neck mass on the posterior side for the last 2 weeks, which was hot initially and has been regressing in size.  Denies any recent URTI symptoms.  Denies any local rash.  Past Medical History:  Diagnosis Date   Accidental fall 12/19/2013   "tripped over pallet in store; briefly lost consciousness" (12/20/2013)   Anemia    Arthritis    "knees, fingers" (12/19/2013)   Closed fracture of facial bones (HCC) 12/19/2013   Concussion with brief LOC 12/19/2013   Head injury, closed, with concussion 12/19/2013   Hyperlipidemia    Hypothyroidism    Stroke (HCC) 12/2011   residual "left sided weakness" (12/19/2013)    Past Surgical History:  Procedure Laterality Date   NO PAST SURGERIES      History reviewed. No pertinent family history.  Social History   Socioeconomic History   Marital status: Widowed    Spouse name: Not on file    Number of children: 4   Years of education: Not on file   Highest education level: 11th grade  Occupational History   Not on file  Tobacco Use   Smoking status: Former    Current packs/day: 0.50    Average packs/day: 0.5 packs/day for 30.0 years (15.0 ttl pk-yrs)    Types: Cigarettes   Smokeless tobacco: Never   Tobacco comments:    12/19/2013 "quit smoking in ~ 1994"  Substance and Sexual Activity   Alcohol use: Yes    Comment: 12/19/2013 "drank a little when I was young"   Drug use: No   Sexual activity: Yes  Other Topics Concern   Not on file  Social History Narrative   Lives alone   4 children: [name]       Enjoys: church, relaxing, read, watch tv      Diet: eats all foods groups   Caffeine: 1-2 cups coffee   Water: 6-8 cups daily      Wears seat belt-does not drive    Smoke detectors    No weapons   Social Determinants of Health   Financial Resource Strain: Low Risk  (08/10/2021)   Overall Financial Resource Strain (CARDIA)    Difficulty of Paying Living Expenses: Not hard at all  Food Insecurity: No Food Insecurity (08/10/2021)  Hunger Vital Sign    Worried About Running Out of Food in the Last Year: Never true    Ran Out of Food in the Last Year: Never true  Transportation Needs: No Transportation Needs (08/10/2021)   PRAPARE - Administrator, Civil Service (Medical): No    Lack of Transportation (Non-Medical): No  Physical Activity: Sufficiently Active (08/10/2021)   Exercise Vital Sign    Days of Exercise per Week: 5 days    Minutes of Exercise per Session: 30 min  Stress: No Stress Concern Present (08/10/2021)   [employer] of Occupational Health - Occupational Stress Questionnaire    Feeling of Stress : Not at all  Social Connections: Moderately Integrated (08/10/2021)   Social Connection and Isolation Panel [NHANES]    Frequency of Communication with Friends and Family: More than three times a week    Frequency of  Social Gatherings with Friends and Family: More than three times a week    Attends Religious Services: More than 4 times per year    Active Member of [employer] or Organizations: Yes    Attends Banker Meetings: More than 4 times per year    Marital Status: Widowed  Intimate Partner Violence: Not At Risk (08/10/2021)   Humiliation, Afraid, Rape, and Kick questionnaire    Fear of Current or Ex-Partner: No    Emotionally Abused: No    Physically Abused: No    Sexually Abused: No    Outpatient Medications Prior to Visit  Medication Sig Dispense Refill   acetaminophen (TYLENOL) 500 MG tablet Take 1,000 mg by mouth every 6 (six) hours as needed. For pain     atorvastatin (LIPITOR) 40 MG tablet TAKE 1 TABLET BY MOUTH ONCE DAILY IN THE MORNING 80 tablet 0   clopidogrel (PLAVIX) 75 MG tablet Take 1 tablet (75 mg total) by mouth daily with breakfast. 90 tablet 3   levothyroxine (SYNTHROID) 100 MCG tablet Take 1 tablet (100 mcg total) by mouth daily before breakfast. 90 tablet 1   lisinopril-hydrochlorothiazide (ZESTORETIC) 10-12.5 MG tablet Take 1 tablet by mouth daily. 90 tablet 3   Vitamin D, Ergocalciferol, (DRISDOL) 1.25 MG (50000 UNIT) CAPS capsule Take 1 capsule by mouth once a week 12 capsule 1   No facility-administered medications prior to visit.    No Known Allergies  ROS Review of Systems  Constitutional:  Negative for chills and fever.  HENT:  Negative for congestion, sinus pressure, sinus pain and sore throat.   Eyes:  Negative for pain and discharge.  Respiratory:  Negative for cough and shortness of breath.   Cardiovascular:  Negative for chest pain and palpitations.  Gastrointestinal:  Negative for abdominal pain, constipation, diarrhea, nausea and vomiting.  Endocrine: Negative for polydipsia and polyuria.  Genitourinary:  Negative for dysuria and hematuria.  Musculoskeletal:  Negative for neck pain and neck stiffness.  Skin:  Negative for rash.  Neurological:   Negative for dizziness and weakness.  Psychiatric/Behavioral:  Negative for agitation and behavioral problems.       Objective:    Physical Exam Vitals reviewed.  Constitutional:      General: She is not in acute distress.    Appearance: She is not diaphoretic.  HENT:     Head: Normocephalic and atraumatic.     Nose: Nose normal. No congestion.     Mouth/Throat:     Mouth: Mucous membranes are moist.     Pharynx: No posterior oropharyngeal erythema.  Eyes:  General: No scleral icterus.    Extraocular Movements: Extraocular movements intact.  Neck:     Comments: Neck mass over right posterior side, about 2 cm in diameter, nontender Cardiovascular:     Rate and Rhythm: Normal rate and regular rhythm.     Pulses: Normal pulses.     Heart sounds: Normal heart sounds. No murmur heard. Pulmonary:     Breath sounds: Normal breath sounds. No wheezing or rales.  Musculoskeletal:     Cervical back: Neck supple. No tenderness.     Right lower leg: No edema.     Left lower leg: No edema.  Skin:    General: Skin is warm.     Findings: No rash.  Neurological:     General: No focal deficit present.     Mental Status: She is alert and oriented to person, place, and time.     Sensory: No sensory deficit.     Motor: No weakness.  Psychiatric:        Mood and Affect: Mood normal.        Behavior: Behavior normal.     BP 136/64 (BP Location: Left Arm)   Pulse 79   Ht 5\' 6"  (1.676 m)   Wt 193 lb 6.4 oz (87.7 kg)   SpO2 95%   BMI 31.22 kg/m  Wt Readings from Last 3 Encounters:  07/03/23 193 lb 6.4 oz (87.7 kg)  03/01/23 195 lb 9.6 oz (88.7 kg)  08/24/22 195 lb (88.5 kg)    Lab Results  Component Value Date   TSH 5.220 (H) 03/01/2023   Lab Results  Component Value Date   WBC 9.0 03/01/2023   HGB 12.2 03/01/2023   HCT 37.6 03/01/2023   MCV 87 03/01/2023   PLT 280 03/01/2023   Lab Results  Component Value Date   NA 140 03/01/2023   K 4.4 03/01/2023   CO2 21  03/01/2023   GLUCOSE 82 03/01/2023   BUN 11 03/01/2023   CREATININE 0.90 03/01/2023   BILITOT 0.3 03/01/2023   ALKPHOS 85 03/01/2023   AST 17 03/01/2023   ALT 13 03/01/2023   PROT 7.2 03/01/2023   ALBUMIN 4.5 03/01/2023   CALCIUM 9.8 03/01/2023   ANIONGAP 8 01/03/2016   EGFR 65 03/01/2023   Lab Results  Component Value Date   CHOL 189 03/01/2023   Lab Results  Component Value Date   HDL 66 03/01/2023   Lab Results  Component Value Date   LDLCALC 107 (H) 03/01/2023   Lab Results  Component Value Date   TRIG 86 03/01/2023   Lab Results  Component Value Date   CHOLHDL 2.9 03/01/2023   Lab Results  Component Value Date   HGBA1C 6.5 (H) 03/01/2023      Assessment & Plan:   Problem List Items Addressed This Visit       Cardiovascular and Mediastinum   Essential hypertension - Primary    BP Readings from Last 1 Encounters:  07/03/23 136/64   Overall well-controlled with Lisinopril-HCTZ Home BP readings reviewed ~ 130s/60s Counseled for compliance with the medications Advised DASH diet and moderate exercise/walking, at least 150 mins/week      Relevant Orders   CMP14+EGFR     Endocrine   Hypothyroidism    Lab Results  Component Value Date   TSH 5.220 (H) 03/01/2023   On Levothyroxine 100 mcg QD, dose was increased from 88 mcg after last TSH check      Relevant Orders   TSH +  free T4   Diabetes mellitus (HCC)    Lab Results  Component Value Date   HGBA1C 6.5 (H) 03/01/2023   Diet controlled Associated with HTN and HLD Diet modification was advised, plan to start Metformin if HbA1C stays above 6.5 Advised to follow diabetic diet On ACEi and statin F/u CMP and lipid panel Diabetic eye exam: Advised to follow up with Ophthalmology for diabetic eye exam      Relevant Orders   CMP14+EGFR   Hemoglobin A1c     Other   Hyperlipidemia    On Atorvastatin 40 mg QD LDL not at goal, needs to follow low carb diet      Neck mass    Unclear  etiology, she is unclear about local insect bite Could be lipoma as well Since size has been regressing, will wait - if increase in size or new symptoms, obtain imaging      Other Visit Diagnoses     Encounter for immunization       Relevant Orders   Flu Vaccine Trivalent High Dose (Fluad) (Completed)        No orders of the defined types were placed in this encounter.   Follow-up: Return in about 4 months (around 11/02/2023) for DM and HTN.    Anabel Halon, MD

## 2023-07-03 NOTE — Assessment & Plan Note (Addendum)
Unclear etiology, she is unclear about local insect bite Could be lipoma as well Since size has been regressing, will wait - if increase in size or new symptoms, obtain imaging

## 2023-07-03 NOTE — Assessment & Plan Note (Addendum)
Lab Results  Component Value Date   HGBA1C 6.5 (H) 03/01/2023   Diet controlled Associated with HTN and HLD Diet modification was advised, plan to start Metformin if HbA1C stays above 6.5 Advised to follow diabetic diet On ACEi and statin F/u CMP and lipid panel Diabetic eye exam: Advised to follow up with Ophthalmology for diabetic eye exam

## 2023-07-03 NOTE — Patient Instructions (Signed)
Please continue to take medications as prescribed. ? ?Please continue to follow low carb diet and perform moderate exercise/walking at least 150 mins/week. ?

## 2023-07-16 ENCOUNTER — Ambulatory Visit (INDEPENDENT_AMBULATORY_CARE_PROVIDER_SITE_OTHER): Payer: Medicare Other

## 2023-07-16 DIAGNOSIS — E1169 Type 2 diabetes mellitus with other specified complication: Secondary | ICD-10-CM

## 2023-07-16 DIAGNOSIS — R7303 Prediabetes: Secondary | ICD-10-CM

## 2023-07-16 LAB — HM DIABETES EYE EXAM

## 2023-07-16 NOTE — Progress Notes (Signed)
Alexis Ross arrived 07/16/2023 and has given verbal consent to obtain images and complete their overdue diabetic retinal screening.  The images have been sent to an ophthalmologist or optometrist for review and interpretation.  Results will be sent back to Anabel Halon, MD for review.  Patient has been informed they will be contacted when we receive the results via telephone or MyChart

## 2023-08-20 ENCOUNTER — Other Ambulatory Visit: Payer: Self-pay | Admitting: Internal Medicine

## 2023-08-20 DIAGNOSIS — E039 Hypothyroidism, unspecified: Secondary | ICD-10-CM

## 2023-09-14 ENCOUNTER — Other Ambulatory Visit: Payer: Self-pay | Admitting: Internal Medicine

## 2023-09-14 DIAGNOSIS — E785 Hyperlipidemia, unspecified: Secondary | ICD-10-CM

## 2023-09-25 ENCOUNTER — Other Ambulatory Visit: Payer: Self-pay | Admitting: Internal Medicine

## 2023-09-25 DIAGNOSIS — Z8673 Personal history of transient ischemic attack (TIA), and cerebral infarction without residual deficits: Secondary | ICD-10-CM

## 2023-10-30 ENCOUNTER — Encounter: Payer: Self-pay | Admitting: Internal Medicine

## 2023-10-30 ENCOUNTER — Ambulatory Visit (INDEPENDENT_AMBULATORY_CARE_PROVIDER_SITE_OTHER): Payer: Medicare Other | Admitting: Internal Medicine

## 2023-10-30 VITALS — BP 132/72 | HR 80 | Ht 66.0 in | Wt 190.4 lb

## 2023-10-30 DIAGNOSIS — E039 Hypothyroidism, unspecified: Secondary | ICD-10-CM | POA: Diagnosis not present

## 2023-10-30 DIAGNOSIS — E782 Mixed hyperlipidemia: Secondary | ICD-10-CM | POA: Diagnosis not present

## 2023-10-30 DIAGNOSIS — E785 Hyperlipidemia, unspecified: Secondary | ICD-10-CM

## 2023-10-30 DIAGNOSIS — R221 Localized swelling, mass and lump, neck: Secondary | ICD-10-CM

## 2023-10-30 DIAGNOSIS — E559 Vitamin D deficiency, unspecified: Secondary | ICD-10-CM

## 2023-10-30 DIAGNOSIS — I10 Essential (primary) hypertension: Secondary | ICD-10-CM

## 2023-10-30 DIAGNOSIS — Z8673 Personal history of transient ischemic attack (TIA), and cerebral infarction without residual deficits: Secondary | ICD-10-CM

## 2023-10-30 DIAGNOSIS — E1169 Type 2 diabetes mellitus with other specified complication: Secondary | ICD-10-CM | POA: Diagnosis not present

## 2023-10-30 MED ORDER — LISINOPRIL-HYDROCHLOROTHIAZIDE 10-12.5 MG PO TABS: 1.0000 | ORAL_TABLET | Freq: Every day | ORAL | 3 refills | Status: DC

## 2023-10-30 MED ORDER — LEVOTHYROXINE SODIUM 100 MCG PO TABS: 100.0000 ug | ORAL_TABLET | Freq: Every day | ORAL | 1 refills | Status: DC

## 2023-10-30 MED ORDER — ATORVASTATIN CALCIUM 40 MG PO TABS: 40.0000 mg | ORAL_TABLET | Freq: Every day | ORAL | 3 refills | Status: AC

## 2023-10-30 MED ORDER — CLOPIDOGREL BISULFATE 75 MG PO TABS: 75.0000 mg | ORAL_TABLET | Freq: Every day | ORAL | 3 refills | Status: AC

## 2023-10-30 NOTE — Assessment & Plan Note (Signed)
 Lab Results  Component Value Date   HGBA1C 6.4 (H) 07/03/2023   Diet controlled Associated with HTN and HLD Diet modification was advised, plan to start Metformin if HbA1C stays above 6.5 Advised to follow diabetic diet On ACEi and statin F/u CMP and lipid panel Diabetic eye exam: Advised to follow up with Ophthalmology for diabetic eye exam

## 2023-10-30 NOTE — Patient Instructions (Addendum)
 Please schedule Medicare Annual Wellness visit.  Please continue to take medications as prescribed.  Please continue to follow low carb diet and perform moderate exercise/walking at least 150 mins/week.  Please get fasting blood tests done before the next visit.

## 2023-10-30 NOTE — Assessment & Plan Note (Signed)
On Atorvastatin 40 mg QD LDL not at goal, needs to follow low carb diet

## 2023-10-30 NOTE — Assessment & Plan Note (Signed)
No focal neurologic deficits On Plavix and statin 

## 2023-10-30 NOTE — Assessment & Plan Note (Signed)
 BP Readings from Last 1 Encounters:  10/30/23 132/72   Overall well-controlled with Lisinopril -HCTZ Home BP readings reviewed ~ 130s/60s Counseled for compliance with the medications Advised DASH diet and moderate exercise/walking, at least 150 mins/week

## 2023-10-30 NOTE — Assessment & Plan Note (Signed)
 Last vitamin D Lab Results  Component Value Date   VD25OH 44.9 03/01/2023   On vitamin D supplement

## 2023-10-30 NOTE — Progress Notes (Signed)
 Established Patient Office Visit  Subjective:  Patient ID: Alexis Ross, female    DOB: Apr 03, 1946  Age: 78 y.o. MRN: [id number]  CC:  Chief Complaint  Patient presents with   Hypertension   Hyperlipidemia   Diabetes    HPI Alexis Ross is a 78 y.o. female with past medical history of HTN, CVA and HLD who presents for follow up of her chronic medical conditions.  HTN: Her BP was wnl today. She has brought BP readings from home, which ranges around 130s/60s. She denies any headache, dizziness, chest pain, dyspnea or palpitations.   Hypothyroidism: TSH was wnl in 09/24. She takes levothyroxine  100 mcg QD.  Denies any constipation, diarrhea, recent weight or appetite change or recent skin or nail changes.  CVA: Takes Plavix  and statin. She has stopped taking Aspirin  as advised. Denies any focal numbness or weakness.  Type II DM with HLD: Diet controlled. Her last HbA1C was 6.4 today. She has cut down sweet intake.  She denies any polyuria or polyphagia currently.  Neck mass: She had noticed a neck mass on the posterior side in 09/24, which was hot initially and has been regressing in size. It appears smaller than prior today. Denies any recent URTI symptoms.  Denies any local rash.  Past Medical History:  Diagnosis Date   Accidental fall 12/19/2013   tripped over pallet in store; briefly lost consciousness (12/20/2013)   Anemia    Arthritis    knees, fingers (12/19/2013)   Closed fracture of facial bones (HCC) 12/19/2013   Concussion with brief LOC 12/19/2013   Head injury, closed, with concussion 12/19/2013   Hyperlipidemia    Hypothyroidism    Stroke (HCC) 12/2011   residual left sided weakness (12/19/2013)    Past Surgical History:  Procedure Laterality Date   NO PAST SURGERIES      History reviewed. No pertinent family history.  Social History   Socioeconomic History   Marital status: Widowed    Spouse name: Not on file   Number of children: 4   Years of  education: Not on file   Highest education level: 11th grade  Occupational History   Not on file  Tobacco Use   Smoking status: Former    Current packs/day: 0.50    Average packs/day: 0.5 packs/day for 30.0 years (15.0 ttl pk-yrs)    Types: Cigarettes   Smokeless tobacco: Never   Tobacco comments:    12/19/2013 quit smoking in ~ 1994  Substance and Sexual Activity   Alcohol use: Yes    Comment: 12/19/2013 drank a little when I was young   Drug use: No   Sexual activity: Yes  Other Topics Concern   Not on file  Social History Narrative   Lives alone   4 children: Dickey Orie Jackee [place]       Enjoys: church, relaxing, read, watch tv      Diet: eats all foods groups   Caffeine: 1-2 cups coffee   Water : 6-8 cups daily      Wears seat belt-does not drive    Smoke detectors    No weapons   Social Drivers of Health   Financial Resource Strain: Low Risk  (08/10/2021)   Overall Financial Resource Strain (CARDIA)    Difficulty of Paying Living Expenses: Not hard at all  Food Insecurity: No Food Insecurity (08/10/2021)   Hunger Vital Sign    Worried About Running Out of Food in the Last Year: Never true  Ran Out of Food in the Last Year: Never true  Transportation Needs: No Transportation Needs (08/10/2021)   PRAPARE - Administrator, Civil Service (Medical): No    Lack of Transportation (Non-Medical): No  Physical Activity: Sufficiently Active (08/10/2021)   Exercise Vital Sign    Days of Exercise per Week: 5 days    Minutes of Exercise per Session: 30 min  Stress: No Stress Concern Present (08/10/2021)   [employer] of Occupational Health - Occupational Stress Questionnaire    Feeling of Stress : Not at all  Social Connections: Moderately Integrated (08/10/2021)   Social Connection and Isolation Panel [NHANES]    Frequency of Communication with Friends and Family: More than three times a week    Frequency of Social Gatherings with Friends and  Family: More than three times a week    Attends Religious Services: More than 4 times per year    Active Member of [employer] or Organizations: Yes    Attends Banker Meetings: More than 4 times per year    Marital Status: Widowed  Intimate Partner Violence: Not At Risk (08/10/2021)   Humiliation, Afraid, Rape, and Kick questionnaire    Fear of Current or Ex-Partner: No    Emotionally Abused: No    Physically Abused: No    Sexually Abused: No    Outpatient Medications Prior to Visit  Medication Sig Dispense Refill   acetaminophen  (TYLENOL ) 500 MG tablet Take 1,000 mg by mouth every 6 (six) hours as needed. For pain     Vitamin D , Ergocalciferol , (DRISDOL ) 1.25 MG (50000 UNIT) CAPS capsule Take 1 capsule by mouth once a week 12 capsule 1   atorvastatin  (LIPITOR) 40 MG tablet TAKE 1 TABLET BY MOUTH ONCE DAILY IN THE MORNING 80 tablet 0   clopidogrel  (PLAVIX ) 75 MG tablet Take 1 tablet by mouth once daily with breakfast 90 tablet 0   levothyroxine  (SYNTHROID ) 100 MCG tablet TAKE 1 TABLET BY MOUTH ONCE DAILY BEFORE BREAKFAST - DOSE CHANGE 03/02/23 90 tablet 0   lisinopril -hydrochlorothiazide  (ZESTORETIC ) 10-12.5 MG tablet Take 1 tablet by mouth daily. 90 tablet 3   No facility-administered medications prior to visit.    No Known Allergies  ROS Review of Systems  Constitutional:  Negative for chills and fever.  HENT:  Negative for congestion, sinus pressure, sinus pain and sore throat.   Eyes:  Negative for pain and discharge.  Respiratory:  Negative for cough and shortness of breath.   Cardiovascular:  Negative for chest pain and palpitations.  Gastrointestinal:  Negative for abdominal pain, diarrhea, nausea and vomiting.  Endocrine: Negative for polydipsia and polyuria.  Genitourinary:  Negative for dysuria and hematuria.  Musculoskeletal:  Negative for neck pain and neck stiffness.  Skin:  Negative for rash.  Neurological:  Negative for dizziness and weakness.   Psychiatric/Behavioral:  Negative for agitation and behavioral problems.       Objective:    Physical Exam Vitals reviewed.  Constitutional:      General: She is not in acute distress.    Appearance: She is not diaphoretic.  HENT:     Head: Normocephalic and atraumatic.     Nose: Nose normal. No congestion.     Mouth/Throat:     Mouth: Mucous membranes are moist.     Pharynx: No posterior oropharyngeal erythema.  Eyes:     General: No scleral icterus.    Extraocular Movements: Extraocular movements intact.  Neck:     Comments: Neck mass  over right posterior side, about 1.5 cm in diameter, nontender Cardiovascular:     Rate and Rhythm: Normal rate and regular rhythm.     Pulses: Normal pulses.     Heart sounds: Normal heart sounds. No murmur heard. Pulmonary:     Breath sounds: Normal breath sounds. No wheezing or rales.  Musculoskeletal:     Cervical back: Neck supple. No tenderness.     Right lower leg: No edema.     Left lower leg: No edema.  Skin:    General: Skin is warm.     Findings: No rash.  Neurological:     General: No focal deficit present.     Mental Status: She is alert and oriented to person, place, and time.     Sensory: No sensory deficit.     Motor: No weakness.  Psychiatric:        Mood and Affect: Mood normal.        Behavior: Behavior normal.     BP 132/72 (BP Location: Left Arm)   Pulse 80   Ht 5' 6 (1.676 m)   Wt 190 lb 6.4 oz (86.4 kg)   SpO2 98%   BMI 30.73 kg/m  Wt Readings from Last 3 Encounters:  10/30/23 190 lb 6.4 oz (86.4 kg)  07/03/23 193 lb 6.4 oz (87.7 kg)  03/01/23 195 lb 9.6 oz (88.7 kg)    Lab Results  Component Value Date   TSH 1.510 07/03/2023   Lab Results  Component Value Date   WBC 9.0 03/01/2023   HGB 12.2 03/01/2023   HCT 37.6 03/01/2023   MCV 87 03/01/2023   PLT 280 03/01/2023   Lab Results  Component Value Date   NA 138 07/03/2023   K 4.4 07/03/2023   CO2 24 07/03/2023   GLUCOSE 94 07/03/2023    BUN 10 07/03/2023   CREATININE 0.90 07/03/2023   BILITOT 0.4 07/03/2023   ALKPHOS 73 07/03/2023   AST 17 07/03/2023   ALT 14 07/03/2023   PROT 6.7 07/03/2023   ALBUMIN 4.5 07/03/2023   CALCIUM  10.1 07/03/2023   ANIONGAP 8 01/03/2016   EGFR 65 07/03/2023   Lab Results  Component Value Date   CHOL 189 03/01/2023   Lab Results  Component Value Date   HDL 66 03/01/2023   Lab Results  Component Value Date   LDLCALC 107 (H) 03/01/2023   Lab Results  Component Value Date   TRIG 86 03/01/2023   Lab Results  Component Value Date   CHOLHDL 2.9 03/01/2023   Lab Results  Component Value Date   HGBA1C 6.4 (H) 07/03/2023      Assessment & Plan:   Problem List Items Addressed This Visit       Cardiovascular and Mediastinum   Essential hypertension - Primary   BP Readings from Last 1 Encounters:  10/30/23 132/72   Overall well-controlled with Lisinopril -HCTZ Home BP readings reviewed ~ 130s/60s Counseled for compliance with the medications Advised DASH diet and moderate exercise/walking, at least 150 mins/week      Relevant Medications   atorvastatin  (LIPITOR) 40 MG tablet   lisinopril -hydrochlorothiazide  (ZESTORETIC ) 10-12.5 MG tablet   Other Relevant Orders   CMP14+EGFR   CBC with Differential/Platelet     Endocrine   Hypothyroidism   Lab Results  Component Value Date   TSH 1.510 07/03/2023   On Levothyroxine  100 mcg once daily Check TSH and free T4      Relevant Medications   levothyroxine  (SYNTHROID ) 100 MCG  tablet   Other Relevant Orders   TSH + free T4   Type 2 Diabetes mellitus with other specified complication   Lab Results  Component Value Date   HGBA1C 6.4 (H) 07/03/2023   Diet controlled Associated with HTN and HLD Diet modification was advised, plan to start Metformin if HbA1C stays above 6.5 Advised to follow diabetic diet On ACEi and statin F/u CMP and lipid panel Diabetic eye exam: Advised to follow up with Ophthalmology for  diabetic eye exam      Relevant Medications   atorvastatin  (LIPITOR) 40 MG tablet   lisinopril -hydrochlorothiazide  (ZESTORETIC ) 10-12.5 MG tablet   Other Relevant Orders   Hemoglobin A1c   CMP14+EGFR   Urine Microalbumin w/creat. ratio   Bayer DCA Hb A1c Waived     Other   Hyperlipidemia   On Atorvastatin  40 mg QD LDL not at goal, needs to follow low carb diet      Relevant Medications   atorvastatin  (LIPITOR) 40 MG tablet   lisinopril -hydrochlorothiazide  (ZESTORETIC ) 10-12.5 MG tablet   Other Relevant Orders   Lipid panel   History of CVA (cerebrovascular accident)   No focal neurologic deficits On Plavix  and statin      Relevant Medications   clopidogrel  (PLAVIX ) 75 MG tablet   Vitamin D  deficiency   Last vitamin D  Lab Results  Component Value Date   VD25OH 44.9 03/01/2023   On vitamin D  supplement      Relevant Orders   VITAMIN D  25 Hydroxy (Vit-D Deficiency, Fractures)   Neck mass   Unclear etiology, could be epidermal cyst vs lipoma Since size has been regressing, will wait - if increase in size or new symptoms, obtain imaging         Meds ordered this encounter  Medications   atorvastatin  (LIPITOR) 40 MG tablet    Sig: Take 1 tablet (40 mg total) by mouth daily at 6 PM.    Dispense:  90 tablet    Refill:  3   levothyroxine  (SYNTHROID ) 100 MCG tablet    Sig: Take 1 tablet (100 mcg total) by mouth daily before breakfast.    Dispense:  90 tablet    Refill:  1   clopidogrel  (PLAVIX ) 75 MG tablet    Sig: Take 1 tablet (75 mg total) by mouth daily with breakfast.    Dispense:  90 tablet    Refill:  3   lisinopril -hydrochlorothiazide  (ZESTORETIC ) 10-12.5 MG tablet    Sig: Take 1 tablet by mouth daily.    Dispense:  90 tablet    Refill:  3    Follow-up: Return in about 4 months (around 02/27/2024) for Annual physical (after 02/29/24).    Suzzane MARLA Blanch, MD

## 2023-10-30 NOTE — Assessment & Plan Note (Signed)
 Unclear etiology, could be epidermal cyst vs lipoma Since size has been regressing, will wait - if increase in size or new symptoms, obtain imaging

## 2023-10-30 NOTE — Assessment & Plan Note (Signed)
 Lab Results  Component Value Date   TSH 1.510 07/03/2023   On Levothyroxine 100 mcg once daily Check TSH and free T4

## 2023-10-31 LAB — BAYER DCA HB A1C WAIVED: HB A1C (BAYER DCA - WAIVED): 6.4 % — ABNORMAL HIGH (ref 4.8–5.6)

## 2023-12-24 ENCOUNTER — Ambulatory Visit (INDEPENDENT_AMBULATORY_CARE_PROVIDER_SITE_OTHER): Payer: Medicare Other

## 2023-12-24 VITALS — BP 134/60 | Ht 66.0 in | Wt 190.0 lb

## 2023-12-24 DIAGNOSIS — Z0000 Encounter for general adult medical examination without abnormal findings: Secondary | ICD-10-CM

## 2023-12-24 DIAGNOSIS — Z5320 Procedure and treatment not carried out because of patient's decision for unspecified reasons: Secondary | ICD-10-CM

## 2023-12-24 NOTE — Progress Notes (Signed)
 Because this visit was a virtual/telehealth visit,  certain criteria was not obtained, such a blood pressure, CBG if applicable, and timed get up and go. Any medications not marked as "taking" were not mentioned during the medication reconciliation part of the visit. Any vitals not documented were not able to be obtained due to this being a telehealth visit or patient was unable to self-report a recent blood pressure reading due to a lack of equipment at home via telehealth. Vitals that have been documented are verbally provided by the patient.   Subjective:   Alexis Ross is a 78 y.o. who presents for a Medicare Wellness preventive visit.  Visit Complete: Virtual I connected with  Alexis Ross on 12/24/23 by a audio enabled telemedicine application and verified that I am speaking with the correct person using two identifiers.  Patient Location: Home  Provider Location: Home Office  I discussed the limitations of evaluation and management by telemedicine. The patient expressed understanding and agreed to proceed.  Vital Signs: Because this visit was a virtual/telehealth visit, some criteria may be missing or patient reported. Any vitals not documented were not able to be obtained and vitals that have been documented are patient reported.  VideoDeclined- This patient declined Librarian, academic. Therefore the visit was completed with audio only.  AWV Questionnaire: No: Patient Medicare AWV questionnaire was not completed prior to this visit.  Cardiac Risk Factors include: advanced age (>57men, >70 women);dyslipidemia;hypertension;obesity (BMI >30kg/m2);sedentary lifestyle     Objective:    Today's Vitals   12/24/23 1354 12/24/23 1358  BP: 134/60   Weight: 190 lb (86.2 kg)   Height: 5\' 6"  (1.676 m)   PainSc:  0-No pain   Body mass index is 30.67 kg/m.     12/24/2023    2:11 PM 08/28/2022   11:07 AM 08/10/2021    2:32 PM 08/10/2020    2:10 PM  01/03/2016    6:18 PM 12/19/2013   11:59 PM 12/24/2011    4:11 PM  Advanced Directives  Does Patient Have a Medical Advance Directive? No No No No No Patient does not have advance directive;Patient would not like information Patient does not have advance directive;Patient would not like information  Would patient like information on creating a medical advance directive? No - Patient declined  No - Patient declined No - Patient declined No - patient declined information    Pre-existing out of facility DNR order (yellow form or pink MOST form)       No    Current Medications (verified) Outpatient Encounter Medications as of 12/24/2023  Medication Sig   acetaminophen (TYLENOL) 500 MG tablet Take 1,000 mg by mouth every 6 (six) hours as needed. For pain   atorvastatin (LIPITOR) 40 MG tablet Take 1 tablet (40 mg total) by mouth daily at 6 PM.   clopidogrel (PLAVIX) 75 MG tablet Take 1 tablet (75 mg total) by mouth daily with breakfast.   levothyroxine (SYNTHROID) 100 MCG tablet Take 1 tablet (100 mcg total) by mouth daily before breakfast.   lisinopril-hydrochlorothiazide (ZESTORETIC) 10-12.5 MG tablet Take 1 tablet by mouth daily.   Vitamin D, Ergocalciferol, (DRISDOL) 1.25 MG (50000 UNIT) CAPS capsule Take 1 capsule by mouth once a week   No facility-administered encounter medications on file as of 12/24/2023.    Allergies (verified) Patient has no known allergies.   History: Past Medical History:  Diagnosis Date   Accidental fall 12/19/2013   "tripped over pallet in store; briefly  lost consciousness" (12/20/2013)   Anemia    Arthritis    "knees, fingers" (12/19/2013)   Closed fracture of facial bones (HCC) 12/19/2013   Concussion with brief LOC 12/19/2013   Head injury, closed, with concussion 12/19/2013   Hyperlipidemia    Hypothyroidism    Stroke (HCC) 12/2011   residual "left sided weakness" (12/19/2013)   Past Surgical History:  Procedure Laterality Date   NO PAST SURGERIES     History  reviewed. No pertinent family history. Social History   Socioeconomic History   Marital status: Widowed    Spouse name: Not on file   Number of children: 4   Years of education: Not on file   Highest education level: 11th grade  Occupational History   Not on file  Tobacco Use   Smoking status: Former    Current packs/day: 0.50    Average packs/day: 0.5 packs/day for 30.0 years (15.0 ttl pk-yrs)    Types: Cigarettes   Smokeless tobacco: Never   Tobacco comments:    12/19/2013 "quit smoking in ~ 1994"  Substance and Sexual Activity   Alcohol use: Yes    Comment: 12/19/2013 "drank a little when I was young"   Drug use: No   Sexual activity: Yes  Other Topics Concern   Not on file  Social History Narrative   Lives alone   4 children: [name]       Enjoys: church, relaxing, read, watch tv      Diet: eats all foods groups   Caffeine: 1-2 cups coffee   Water: 6-8 cups daily      Wears seat belt-does not drive    Smoke detectors    No weapons   Social Drivers of Corporate investment banker Strain: Low Risk  (12/24/2023)   Overall Financial Resource Strain (CARDIA)    Difficulty of Paying Living Expenses: Not hard at all  Food Insecurity: No Food Insecurity (12/24/2023)   Hunger Vital Sign    Worried About Running Out of Food in the Last Year: Never true    Ran Out of Food in the Last Year: Never true  Transportation Needs: No Transportation Needs (12/24/2023)   PRAPARE - Administrator, Civil Service (Medical): No    Lack of Transportation (Non-Medical): No  Physical Activity: Sufficiently Active (12/24/2023)   Exercise Vital Sign    Days of Exercise per Week: 7 days    Minutes of Exercise per Session: 30 min  Stress: No Stress Concern Present (12/24/2023)   [employer] of Occupational Health - Occupational Stress Questionnaire    Feeling of Stress : Not at all  Social Connections: Moderately Integrated (12/24/2023)   Social Connection  and Isolation Panel [NHANES]    Frequency of Communication with Friends and Family: More than three times a week    Frequency of Social Gatherings with Friends and Family: More than three times a week    Attends Religious Services: More than 4 times per year    Active Member of [employer] or Organizations: Yes    Attends Banker Meetings: More than 4 times per year    Marital Status: Widowed    Tobacco Counseling Counseling given: Yes Tobacco comments: 12/19/2013 "quit smoking in ~ 1994"    Clinical Intake:  Pre-visit preparation completed: Yes  Pain : No/denies pain Pain Score: 0-No pain     BMI - recorded: 30.67 Nutritional Status: BMI > 30  Obese Nutritional Risks: None Diabetes: No  How often do you need to have someone help you when you read instructions, pamphlets, or other written materials from your doctor or pharmacy?: 1 - Never  Interpreter Needed?: No  Information entered by :: Abby Elford Evilsizer, CMA   Activities of Daily Living     12/24/2023    2:12 PM  In your present state of health, do you have any difficulty performing the following activities:  Hearing? 0  Vision? 0  Difficulty concentrating or making decisions? 0  Walking or climbing stairs? 0  Dressing or bathing? 0  Doing errands, shopping? 0  Preparing Food and eating ? N  Using the Toilet? N  In the past six months, have you accidently leaked urine? N  Do you have problems with loss of bowel control? N  Managing your Medications? N  Managing your Finances? N  Housekeeping or managing your Housekeeping? N    Patient Care Team: Anabel Halon, MD as PCP - General (Internal Medicine)  Indicate any recent Medical Services you may have received from other than [facility] providers in the past year (date may be approximate).     Assessment:   This is a routine wellness examination for [facility].  Hearing/Vision screen Hearing Screening - Comments:: Patient denies any hearing difficulties.    Vision Screening - Comments:: Patients daughter has scheduled her an appt with a new provider but she can't recall their name.    Goals Addressed             This Visit's Progress    Patient Stated       Go to the beach with my children.        Depression Screen     12/24/2023    2:16 PM 10/30/2023    9:37 AM 07/03/2023    8:36 AM 03/01/2023   10:34 AM 08/28/2022   11:10 AM 08/24/2022   10:49 AM 02/21/2022    9:52 AM  PHQ 2/9 Scores  PHQ - 2 Score 0 0 0 0 0 0 0  PHQ- 9 Score 0 0         Fall Risk     12/24/2023    2:11 PM 10/30/2023    9:37 AM 07/03/2023    8:36 AM 03/01/2023   10:34 AM 08/28/2022   11:10 AM  Fall Risk   Falls in the past year? 0 0 0 0 0  Number falls in past yr: 0 0 0 0 0  Injury with Fall? 0 0 0 0 0  Risk for fall due to : No Fall Risks No Fall Risks     Follow up Falls prevention discussed;Falls evaluation completed Falls evaluation completed       MEDICARE RISK AT HOME:  Medicare Risk at Home Any stairs in or around the home?: No If so, are there any without handrails?: No Home free of loose throw rugs in walkways, pet beds, electrical cords, etc?: Yes Adequate lighting in your home to reduce risk of falls?: Yes Life alert?: No Use of a cane, walker or w/c?: No Grab bars in the bathroom?: Yes Shower chair or bench in shower?: Yes Elevated toilet seat or a handicapped toilet?: Yes  TIMED UP AND GO:  Was the test performed?  No  Cognitive Function: 6CIT completed        12/24/2023    1:59 PM 08/28/2022   11:10 AM 08/10/2021    2:35 PM 08/10/2020    2:15 PM  6CIT Screen  What Year? 0  points 0 points 0 points 0 points  What month? 0 points 0 points 0 points 0 points  What time? 0 points 0 points 0 points 0 points  Count back from 20 0 points 0 points 0 points 4 points  Months in reverse 0 points 0 points 4 points 4 points  Repeat phrase 0 points 0 points 2 points 0 points  Total Score 0 points 0 points 6 points 8 points     Immunizations Immunization History  Administered Date(s) Administered   Fluad Quad(high Dose 65+) 06/23/2020, 08/19/2021, 08/24/2022   Fluad Trivalent(High Dose 65+) 07/03/2023   Influenza-Unspecified 07/16/2013   Moderna Covid-19 Vaccine Bivalent Booster 38yrs & up 10/25/2021   Moderna SARS-COV2 Booster Vaccination 06/25/2022   Moderna Sars-Covid-2 Vaccination 12/10/2019, 01/07/2020, 09/18/2020   PNEUMOCOCCAL CONJUGATE-20 02/21/2022   Pneumococcal Polysaccharide-23 12/25/2011   Zoster Recombinant(Shingrix) 03/02/2023, 05/14/2023    Screening Tests Health Maintenance  Topic Date Due   DTaP/Tdap/Td (1 - Tdap) Never done   DEXA SCAN  05/03/2019   COVID-19 Vaccine (6 - 2024-25 season) 06/17/2023   Diabetic kidney evaluation - Urine ACR  02/29/2024   FOOT EXAM  02/29/2024   HEMOGLOBIN A1C  04/28/2024   Diabetic kidney evaluation - eGFR measurement  07/02/2024   OPHTHALMOLOGY EXAM  07/15/2024   Medicare Annual Wellness (AWV)  12/23/2024   Pneumonia Vaccine 66+ Years old  Completed   INFLUENZA VACCINE  Completed   Hepatitis C Screening  Completed   Zoster Vaccines- Shingrix  Completed   HPV VACCINES  Aged Out    Health Maintenance  Health Maintenance Due  Topic Date Due   DTaP/Tdap/Td (1 - Tdap) Never done   DEXA SCAN  05/03/2019   COVID-19 Vaccine (6 - 2024-25 season) 06/17/2023   Health Maintenance Items Addressed: Declined referral for mammogram and bone density  Additional Screening:  Vision Screening: Recommended annual ophthalmology exams for early detection of glaucoma and other disorders of the eye.  Dental Screening: Recommended annual dental exams for proper oral hygiene  Community Resource Referral / Chronic Care Management: CRR required this visit?  No   CCM required this visit?  No     Plan:     I have personally reviewed and noted the following in the patient's chart:   Medical and social history Use of alcohol, tobacco or illicit drugs   Current medications and supplements including opioid prescriptions. Patient is not currently taking opioid prescriptions. Functional ability and status Nutritional status Physical activity Advanced directives List of other physicians Hospitalizations, surgeries, and ER visits in previous 12 months Vitals Screenings to include cognitive, depression, and falls Referrals and appointments  In addition, I have reviewed and discussed with patient certain preventive protocols, quality metrics, and best practice recommendations. A written personalized care plan for preventive services as well as general preventive health recommendations were provided to patient.     Alexis Ross, CMA   12/24/2023   After Visit Summary: (Mail) Due to this being a telephonic visit, the after visit summary with patients personalized plan was offered to patient via mail   Notes: Please refer to Routing Comments.

## 2023-12-24 NOTE — Patient Instructions (Signed)
 Alexis Ross , Thank you for taking time to come for your Medicare Wellness Visit. I appreciate your ongoing commitment to your health goals. Please review the following plan we discussed and let me know if I can assist you in the future.   Referrals/Orders/Follow-Ups/Clinician Recommendations:  Next Medicare Annual Wellness Visit:   December 24, 2024 at 12:30 pm telephone visit.   This is a list of the screening recommended for you and due dates:  Health Maintenance  Topic Date Due   DTaP/Tdap/Td vaccine (1 - Tdap) Never done   DEXA scan (bone density measurement)  05/03/2019   COVID-19 Vaccine (6 - 2024-25 season) 06/17/2023   Yearly kidney health urinalysis for diabetes  02/29/2024   Complete foot exam   02/29/2024   Hemoglobin A1C  04/28/2024   Yearly kidney function blood test for diabetes  07/02/2024   Eye exam for diabetics  07/15/2024   Medicare Annual Wellness Visit  12/23/2024   Pneumonia Vaccine  Completed   Flu Shot  Completed   Hepatitis C Screening  Completed   Zoster (Shingles) Vaccine  Completed   HPV Vaccine  Aged Out    Advanced directives: (ACP Link)Information on Advanced Care Planning can be found at [facility] [place] Advance Health Care Directives Advance Health Care Directives. [email or web address]   Next Medicare Annual Wellness Visit scheduled for next year: yes  Understanding Your Risk for Falls Millions of people have serious injuries from falls each year. It is important to understand your risk of falling. Talk with your health care provider about your risk and what you can do to lower it. If you do have a serious fall, make sure to tell your provider. Falling once raises your risk of falling again. How can falls affect me? Serious injuries from falls are common. These include: Broken bones, such as hip fractures. Head injuries, such as traumatic brain injuries (TBI) or concussions. A fear of falling can cause you to avoid activities and stay at home.  This can make your muscles weaker and raise your risk for a fall. What can increase my risk? There are a number of risk factors that increase your risk for falling. The more risk factors you have, the higher your risk of falling. Serious injuries from a fall happen most often to people who are older than 80 years old. Teenagers and young adults ages 77-29 are also at higher risk. Common risk factors include: Weakness in the lower body. Being generally weak or confused due to long-term (chronic) illness. Dizziness or balance problems. Poor vision. Medicines that cause dizziness or drowsiness. These may include: Medicines for your blood pressure, heart, anxiety, insomnia, or swelling (edema). Pain medicines. Muscle relaxants. Other risk factors include: Drinking alcohol. Having had a fall in the past. Having foot pain or wearing improper footwear. Working at a dangerous job. Having any of the following in your home: Tripping hazards, such as floor clutter or loose rugs. Poor lighting. Pets. Having dementia or memory loss. What actions can I take to lower my risk of falling?     Physical activity Stay physically fit. Do strength and balance exercises. Consider taking a regular class to build strength and balance. Yoga and tai chi are good options. Vision Have your eyes checked every year and your prescription for glasses or contacts updated as needed. Shoes and walking aids Wear non-skid shoes. Wear shoes that have rubber soles and low heels. Do not wear high heels. Do not walk around the house  in socks or slippers. Use a cane or walker as told by your provider. Home safety Attach secure railings on both sides of your stairs. Install grab bars for your bathtub, shower, and toilet. Use a non-skid mat in your bathtub or shower. Attach bath mats securely with double-sided, non-slip rug tape. Use good lighting in all rooms. Keep a flashlight near your bed. Make sure there is a clear  path from your bed to the bathroom. Use night-lights. Do not use throw rugs. Make sure all carpeting is taped or tacked down securely. Remove all clutter from walkways and stairways, including extension cords. Repair uneven or broken steps and floors. Avoid walking on icy or slippery surfaces. Walk on the grass instead of on icy or slick sidewalks. Use ice melter to get rid of ice on walkways in the winter. Use a cordless phone. Questions to ask your health care provider Can you help me check my risk for a fall? Do any of my medicines make me more likely to fall? Should I take a vitamin D supplement? What exercises can I do to improve my strength and balance? Should I make an appointment to have my vision checked? Do I need a bone density test to check for weak bones (osteoporosis)? Would it help to use a cane or a walker? Where to find more information Centers for Disease Control and Prevention, STEADI: TonerPromos.no Community-Based Fall Prevention Programs: TonerPromos.no [employer] on Aging: BaseRingTones.pl Contact a health care provider if: You fall at home. You are afraid of falling at home. You feel weak, drowsy, or dizzy. This information is not intended to replace advice given to you by your health care provider. Make sure you discuss any questions you have with your health care provider. Document Revised: 06/05/2022 Document Reviewed: 06/05/2022 Elsevier Patient Education  2024 [employer].

## 2024-02-28 DIAGNOSIS — E782 Mixed hyperlipidemia: Secondary | ICD-10-CM | POA: Diagnosis not present

## 2024-02-28 DIAGNOSIS — E559 Vitamin D deficiency, unspecified: Secondary | ICD-10-CM | POA: Diagnosis not present

## 2024-02-28 DIAGNOSIS — E1169 Type 2 diabetes mellitus with other specified complication: Secondary | ICD-10-CM | POA: Diagnosis not present

## 2024-02-28 DIAGNOSIS — E039 Hypothyroidism, unspecified: Secondary | ICD-10-CM | POA: Diagnosis not present

## 2024-02-28 DIAGNOSIS — I10 Essential (primary) hypertension: Secondary | ICD-10-CM | POA: Diagnosis not present

## 2024-03-01 LAB — CBC WITH DIFFERENTIAL/PLATELET
Basophils Absolute: 0 10*3/uL (ref 0.0–0.2)
Basos: 1 %
EOS (ABSOLUTE): 0.1 10*3/uL (ref 0.0–0.4)
Eos: 1 %
Hematocrit: 40.6 % (ref 34.0–46.6)
Hemoglobin: 12.8 g/dL (ref 11.1–15.9)
Immature Grans (Abs): 0 10*3/uL (ref 0.0–0.1)
Immature Granulocytes: 0 %
Lymphocytes Absolute: 2.6 10*3/uL (ref 0.7–3.1)
Lymphs: 35 %
MCH: 28.6 pg (ref 26.6–33.0)
MCHC: 31.5 g/dL (ref 31.5–35.7)
MCV: 91 fL (ref 79–97)
Monocytes Absolute: 0.7 10*3/uL (ref 0.1–0.9)
Monocytes: 9 %
Neutrophils Absolute: 4 10*3/uL (ref 1.4–7.0)
Neutrophils: 54 %
Platelets: 289 10*3/uL (ref 150–450)
RBC: 4.47 x10E6/uL (ref 3.77–5.28)
RDW: 12.6 % (ref 11.7–15.4)
WBC: 7.4 10*3/uL (ref 3.4–10.8)

## 2024-03-01 LAB — MICROALBUMIN / CREATININE URINE RATIO
Creatinine, Urine: 312.6 mg/dL
Microalb/Creat Ratio: 6 mg/g{creat} (ref 0–29)
Microalbumin, Urine: 19.2 ug/mL

## 2024-03-01 LAB — VITAMIN D 25 HYDROXY (VIT D DEFICIENCY, FRACTURES): Vit D, 25-Hydroxy: 20.5 ng/mL — ABNORMAL LOW (ref 30.0–100.0)

## 2024-03-01 LAB — CMP14+EGFR
ALT: 12 IU/L (ref 0–32)
AST: 19 IU/L (ref 0–40)
Albumin: 4.6 g/dL (ref 3.8–4.8)
Alkaline Phosphatase: 90 IU/L (ref 44–121)
BUN/Creatinine Ratio: 13 (ref 12–28)
BUN: 12 mg/dL (ref 8–27)
Bilirubin Total: 0.5 mg/dL (ref 0.0–1.2)
CO2: 24 mmol/L (ref 20–29)
Calcium: 10.5 mg/dL — ABNORMAL HIGH (ref 8.7–10.3)
Chloride: 101 mmol/L (ref 96–106)
Creatinine, Ser: 0.89 mg/dL (ref 0.57–1.00)
Globulin, Total: 2.4 g/dL (ref 1.5–4.5)
Glucose: 94 mg/dL (ref 70–99)
Potassium: 4.4 mmol/L (ref 3.5–5.2)
Sodium: 139 mmol/L (ref 134–144)
Total Protein: 7 g/dL (ref 6.0–8.5)
eGFR: 65 mL/min/{1.73_m2} (ref >59)

## 2024-03-01 LAB — TSH+FREE T4
Free T4: 1.13 ng/dL (ref 0.82–1.77)
TSH: 0.359 u[IU]/mL — ABNORMAL LOW (ref 0.450–4.500)

## 2024-03-01 LAB — LIPID PANEL
Chol/HDL Ratio: 3 ratio (ref 0.0–4.4)
Cholesterol, Total: 187 mg/dL (ref 100–199)
HDL: 62 mg/dL (ref >39)
LDL Chol Calc (NIH): 108 mg/dL — ABNORMAL HIGH (ref 0–99)
Triglycerides: 92 mg/dL (ref 0–149)
VLDL Cholesterol Cal: 17 mg/dL (ref 5–40)

## 2024-03-01 LAB — HEMOGLOBIN A1C
Est. average glucose Bld gHb Est-mCnc: 140 mg/dL
Hgb A1c MFr Bld: 6.5 % — ABNORMAL HIGH (ref 4.8–5.6)

## 2024-03-03 ENCOUNTER — Encounter: Payer: Self-pay | Admitting: Internal Medicine

## 2024-03-03 ENCOUNTER — Ambulatory Visit (INDEPENDENT_AMBULATORY_CARE_PROVIDER_SITE_OTHER): Payer: Medicare Other | Admitting: Internal Medicine

## 2024-03-03 VITALS — BP 134/66 | HR 76 | Ht 66.0 in | Wt 193.2 lb

## 2024-03-03 DIAGNOSIS — Z8673 Personal history of transient ischemic attack (TIA), and cerebral infarction without residual deficits: Secondary | ICD-10-CM

## 2024-03-03 DIAGNOSIS — E782 Mixed hyperlipidemia: Secondary | ICD-10-CM | POA: Diagnosis not present

## 2024-03-03 DIAGNOSIS — E039 Hypothyroidism, unspecified: Secondary | ICD-10-CM | POA: Diagnosis not present

## 2024-03-03 DIAGNOSIS — E1169 Type 2 diabetes mellitus with other specified complication: Secondary | ICD-10-CM

## 2024-03-03 DIAGNOSIS — I10 Essential (primary) hypertension: Secondary | ICD-10-CM

## 2024-03-03 DIAGNOSIS — Z0001 Encounter for general adult medical examination with abnormal findings: Secondary | ICD-10-CM | POA: Diagnosis not present

## 2024-03-03 DIAGNOSIS — M8580 Other specified disorders of bone density and structure, unspecified site: Secondary | ICD-10-CM | POA: Insufficient documentation

## 2024-03-03 NOTE — Assessment & Plan Note (Addendum)
 BP Readings from Last 1 Encounters:  03/03/24 134/66   Overall well-controlled with Lisinopril -HCTZ Home BP readings reviewed ~ 130s/60s Counseled for compliance with the medications Advised DASH diet and moderate exercise/walking, at least 150 mins/week

## 2024-03-03 NOTE — Assessment & Plan Note (Signed)
Physical exam as documented. Fasting blood tests today. Advised to get Tdap vaccines at local pharmacy.

## 2024-03-03 NOTE — Assessment & Plan Note (Signed)
No focal neurologic deficits On Plavix and statin 

## 2024-03-03 NOTE — Assessment & Plan Note (Addendum)
 On Atorvastatin  40 mg QD LDL not at goal, needs to follow low cholesterol diet - will keep same dose of Lipitor for now considering her age

## 2024-03-03 NOTE — Assessment & Plan Note (Addendum)
 Lab Results  Component Value Date   TSH 0.359 (L) 02/28/2024   Free T4 wnl On Levothyroxine  100 mcg once daily, will keep same dose for now Check TSH and free T4

## 2024-03-03 NOTE — Assessment & Plan Note (Signed)
 Advised to start vitamin D  2000 IU QD

## 2024-03-03 NOTE — Progress Notes (Signed)
 Established Patient Office Visit  Subjective:  Patient ID: Alexis Ross, female    DOB: 08-Sep-1946  Age: 78 y.o. MRN: [id number]  CC:  Chief Complaint  Patient presents with   Annual Exam    Cpe today.     HPI Alexis Ross is a 78 y.o. female with past medical history of HTN, CVA and HLD who presents for annual physical.  HTN: Her BP was wnl today. She has brought BP readings from home, which ranges around 130s/60s. She denies any headache, dizziness, chest pain, dyspnea or palpitations.   Hypothyroidism: TSH was borderline low, with normal free T4 level. She takes levothyroxine  100 mcg QD.  Denies any constipation, diarrhea, recent weight or appetite change or recent skin or nail changes.  CVA: Takes Plavix  and statin. She has stopped taking Aspirin  as advised. Denies any focal numbness or weakness.  Type II DM with HLD: Diet controlled. Her last HbA1C is 6.5 now. She has cut down sweet intake.  She denies any polyuria or polyphagia currently.  Neck mass: She had noticed a neck mass on the posterior side in 09/24, which was hot initially and has been regressing in size. It appears smaller than prior today. Denies any recent URTI symptoms.  Denies any local rash.  Past Medical History:  Diagnosis Date   Accidental fall 12/19/2013   "tripped over pallet in store; briefly lost consciousness" (12/20/2013)   Anemia    Arthritis    "knees, fingers" (12/19/2013)   Closed fracture of facial bones (HCC) 12/19/2013   Concussion with brief LOC 12/19/2013   Head injury, closed, with concussion 12/19/2013   Hyperlipidemia    Hypothyroidism    Stroke (HCC) 12/2011   residual "left sided weakness" (12/19/2013)    Past Surgical History:  Procedure Laterality Date   NO PAST SURGERIES      History reviewed. No pertinent family history.  Social History   Socioeconomic History   Marital status: Widowed    Spouse name: Not on file   Number of children: 4   Years of education: Not on  file   Highest education level: 11th grade  Occupational History   Not on file  Tobacco Use   Smoking status: Former    Current packs/day: 0.50    Average packs/day: 0.5 packs/day for 30.0 years (15.0 ttl pk-yrs)    Types: Cigarettes   Smokeless tobacco: Never   Tobacco comments:    12/19/2013 "quit smoking in ~ 1994"  Substance and Sexual Activity   Alcohol use: Yes    Comment: 12/19/2013 "drank a little when I was young"   Drug use: No   Sexual activity: Yes  Other Topics Concern   Not on file  Social History Narrative   Lives alone   4 children: Annmarie Basket       Enjoys: church, relaxing, read, watch tv      Diet: eats all foods groups   Caffeine: 1-2 cups coffee   Water: 6-8 cups daily      Wears seat belt-does not drive    Smoke detectors    No weapons   Social Drivers of Corporate investment banker Strain: Low Risk  (12/24/2023)   Overall Financial Resource Strain (CARDIA)    Difficulty of Paying Living Expenses: Not hard at all  Food Insecurity: No Food Insecurity (12/24/2023)   Hunger Vital Sign    Worried About Running Out of Food in the Last Year: Never true  Ran Out of Food in the Last Year: Never true  Transportation Needs: No Transportation Needs (12/24/2023)   PRAPARE - Administrator, Civil Service (Medical): No    Lack of Transportation (Non-Medical): No  Physical Activity: Sufficiently Active (12/24/2023)   Exercise Vital Sign    Days of Exercise per Week: 7 days    Minutes of Exercise per Session: 30 min  Stress: No Stress Concern Present (12/24/2023)   [employer] of Occupational Health - Occupational Stress Questionnaire    Feeling of Stress : Not at all  Social Connections: Moderately Integrated (12/24/2023)   Social Connection and Isolation Panel [NHANES]    Frequency of Communication with Friends and Family: More than three times a week    Frequency of Social Gatherings with Friends and Family: More than three  times a week    Attends Religious Services: More than 4 times per year    Active Member of [employer] or Organizations: Yes    Attends Banker Meetings: More than 4 times per year    Marital Status: Widowed  Intimate Partner Violence: Not At Risk (12/24/2023)   Humiliation, Afraid, Rape, and Kick questionnaire    Fear of Current or Ex-Partner: No    Emotionally Abused: No    Physically Abused: No    Sexually Abused: No    Outpatient Medications Prior to Visit  Medication Sig Dispense Refill   acetaminophen  (TYLENOL ) 500 MG tablet Take 1,000 mg by mouth every 6 (six) hours as needed. For pain     atorvastatin  (LIPITOR) 40 MG tablet Take 1 tablet (40 mg total) by mouth daily at 6 PM. 90 tablet 3   clopidogrel  (PLAVIX ) 75 MG tablet Take 1 tablet (75 mg total) by mouth daily with breakfast. 90 tablet 3   levothyroxine  (SYNTHROID ) 100 MCG tablet Take 1 tablet (100 mcg total) by mouth daily before breakfast. 90 tablet 1   lisinopril -hydrochlorothiazide  (ZESTORETIC ) 10-12.5 MG tablet Take 1 tablet by mouth daily. 90 tablet 3   Vitamin D , Ergocalciferol , (DRISDOL ) 1.25 MG (50000 UNIT) CAPS capsule Take 1 capsule by mouth once a week 12 capsule 1   No facility-administered medications prior to visit.    No Known Allergies  ROS Review of Systems  Constitutional:  Negative for chills and fever.  HENT:  Negative for congestion, sinus pressure, sinus pain and sore throat.   Eyes:  Negative for pain and discharge.  Respiratory:  Negative for cough and shortness of breath.   Cardiovascular:  Negative for chest pain and palpitations.  Gastrointestinal:  Negative for abdominal pain, diarrhea, nausea and vomiting.  Endocrine: Negative for polydipsia and polyuria.  Genitourinary:  Negative for dysuria and hematuria.  Musculoskeletal:  Negative for neck pain and neck stiffness.  Skin:  Negative for rash.  Neurological:  Negative for dizziness and weakness.  Psychiatric/Behavioral:   Negative for agitation and behavioral problems.       Objective:    Physical Exam Vitals reviewed.  Constitutional:      General: She is not in acute distress.    Appearance: She is not diaphoretic.  HENT:     Head: Normocephalic and atraumatic.     Nose: Nose normal. No congestion.     Mouth/Throat:     Mouth: Mucous membranes are moist.     Pharynx: No posterior oropharyngeal erythema.  Eyes:     General: No scleral icterus.    Extraocular Movements: Extraocular movements intact.  Neck:     Comments:  Neck mass over right posterior side, about 1.5 cm in diameter, nontender - likely lipoma Cardiovascular:     Rate and Rhythm: Normal rate and regular rhythm.     Heart sounds: Normal heart sounds. No murmur heard. Pulmonary:     Breath sounds: Normal breath sounds. No wheezing or rales.  Abdominal:     Palpations: Abdomen is soft.     Tenderness: There is no abdominal tenderness.  Musculoskeletal:     Cervical back: Neck supple. No tenderness.     Right lower leg: No edema.     Left lower leg: No edema.  Skin:    General: Skin is warm.     Findings: No rash.  Neurological:     General: No focal deficit present.     Mental Status: She is alert and oriented to person, place, and time.     Cranial Nerves: No cranial nerve deficit.     Sensory: No sensory deficit.     Motor: No weakness.  Psychiatric:        Mood and Affect: Mood normal.        Behavior: Behavior normal.     BP 134/66 (BP Location: Left Arm)   Pulse 76   Ht 5\' 6"  (1.676 m)   Wt 193 lb 3.2 oz (87.6 kg)   SpO2 95%   BMI 31.18 kg/m  Wt Readings from Last 3 Encounters:  03/03/24 193 lb 3.2 oz (87.6 kg)  12/24/23 190 lb (86.2 kg)  10/30/23 190 lb 6.4 oz (86.4 kg)    Lab Results  Component Value Date   TSH 0.359 (L) 02/28/2024   Lab Results  Component Value Date   WBC 7.4 02/28/2024   HGB 12.8 02/28/2024   HCT 40.6 02/28/2024   MCV 91 02/28/2024   PLT 289 02/28/2024   Lab Results   Component Value Date   NA 139 02/28/2024   K 4.4 02/28/2024   CO2 24 02/28/2024   GLUCOSE 94 02/28/2024   BUN 12 02/28/2024   CREATININE 0.89 02/28/2024   BILITOT 0.5 02/28/2024   ALKPHOS 90 02/28/2024   AST 19 02/28/2024   ALT 12 02/28/2024   PROT 7.0 02/28/2024   ALBUMIN 4.6 02/28/2024   CALCIUM  10.5 (H) 02/28/2024   ANIONGAP 8 01/03/2016   EGFR 65 02/28/2024   Lab Results  Component Value Date   CHOL 187 02/28/2024   Lab Results  Component Value Date   HDL 62 02/28/2024   Lab Results  Component Value Date   LDLCALC 108 (H) 02/28/2024   Lab Results  Component Value Date   TRIG 92 02/28/2024   Lab Results  Component Value Date   CHOLHDL 3.0 02/28/2024   Lab Results  Component Value Date   HGBA1C 6.5 (H) 02/28/2024      Assessment & Plan:   Problem List Items Addressed This Visit       Cardiovascular and Mediastinum   Essential hypertension   BP Readings from Last 1 Encounters:  03/03/24 134/66   Overall well-controlled with Lisinopril -HCTZ Home BP readings reviewed ~ 130s/60s Counseled for compliance with the medications Advised DASH diet and moderate exercise/walking, at least 150 mins/week        Endocrine   Hypothyroidism   Lab Results  Component Value Date   TSH 0.359 (L) 02/28/2024   Free T4 wnl On Levothyroxine  100 mcg once daily, will keep same dose for now Check TSH and free T4      Relevant Orders  TSH + free T4   Type 2 Diabetes mellitus with other specified complication   Lab Results  Component Value Date   HGBA1C 6.5 (H) 02/28/2024   Diet controlled Associated with HTN and HLD Diet modification was advised, plan to start Metformin if HbA1C gets near or above 7 (due to her age) Advised to follow diabetic diet On ACEi and statin F/u CMP and lipid panel Diabetic eye exam: Advised to follow up with Ophthalmology for diabetic eye exam      Relevant Orders   Basic Metabolic Panel (BMET)   Hemoglobin A1c      Musculoskeletal and Integument   Osteopenia   Advised to start vitamin D  2000 IU QD      Relevant Orders   DG Bone Density     Other   Hyperlipidemia   On Atorvastatin  40 mg QD LDL not at goal, needs to follow low cholesterol diet - will keep same dose of Lipitor for now considering her age      History of CVA (cerebrovascular accident)   No focal neurologic deficits On Plavix  and statin      Encounter for general adult medical examination with abnormal findings - Primary   Physical exam as documented. Fasting blood tests today. Advised to get Tdap vaccines at local pharmacy.          No orders of the defined types were placed in this encounter.   Follow-up: Return in about 4 months (around 07/04/2024) for DM and hypothyroidism.    Meldon Sport, MD

## 2024-03-03 NOTE — Patient Instructions (Addendum)
 Please schedule DEXA scan.  Please start taking Vitamin D  2000 IU once daily.  Please continue to take medications as prescribed.  Please continue to follow low carb diet and perform moderate exercise/walking as tolerated.  Please get fasting blood tests done before the next visit.

## 2024-03-03 NOTE — Assessment & Plan Note (Signed)
 Lab Results  Component Value Date   HGBA1C 6.5 (H) 02/28/2024   Diet controlled Associated with HTN and HLD Diet modification was advised, plan to start Metformin if HbA1C gets near or above 7 (due to her age) Advised to follow diabetic diet On ACEi and statin F/u CMP and lipid panel Diabetic eye exam: Advised to follow up with Ophthalmology for diabetic eye exam

## 2024-04-28 ENCOUNTER — Encounter (HOSPITAL_COMMUNITY): Payer: Self-pay

## 2024-04-28 ENCOUNTER — Emergency Department (HOSPITAL_COMMUNITY)

## 2024-04-28 ENCOUNTER — Emergency Department (HOSPITAL_COMMUNITY)
Admission: EM | Admit: 2024-04-28 | Discharge: 2024-04-28 | Disposition: A | Discharge status: Home / Self Care | Attending: Emergency Medicine | Admitting: Emergency Medicine

## 2024-04-28 ENCOUNTER — Other Ambulatory Visit: Payer: Self-pay

## 2024-04-28 DIAGNOSIS — E119 Type 2 diabetes mellitus without complications: Secondary | ICD-10-CM | POA: Insufficient documentation

## 2024-04-28 DIAGNOSIS — Z8673 Personal history of transient ischemic attack (TIA), and cerebral infarction without residual deficits: Secondary | ICD-10-CM | POA: Diagnosis not present

## 2024-04-28 DIAGNOSIS — E039 Hypothyroidism, unspecified: Secondary | ICD-10-CM | POA: Diagnosis not present

## 2024-04-28 DIAGNOSIS — I10 Essential (primary) hypertension: Secondary | ICD-10-CM | POA: Diagnosis not present

## 2024-04-28 DIAGNOSIS — R55 Syncope and collapse: Secondary | ICD-10-CM | POA: Insufficient documentation

## 2024-04-28 DIAGNOSIS — I672 Cerebral atherosclerosis: Secondary | ICD-10-CM | POA: Diagnosis not present

## 2024-04-28 LAB — CBC WITH DIFFERENTIAL/PLATELET
Abs Immature Granulocytes: 0.03 K/uL (ref 0.00–0.07)
Basophils Absolute: 0 K/uL (ref 0.0–0.1)
Basophils Relative: 0 %
Eosinophils Absolute: 0.1 K/uL (ref 0.0–0.5)
Eosinophils Relative: 1 %
HCT: 37.4 % (ref 36.0–46.0)
Hemoglobin: 11.9 g/dL — ABNORMAL LOW (ref 12.0–15.0)
Immature Granulocytes: 0 %
Lymphocytes Relative: 27 %
Lymphs Abs: 2.2 K/uL (ref 0.7–4.0)
MCH: 28.5 pg (ref 26.0–34.0)
MCHC: 31.8 g/dL (ref 30.0–36.0)
MCV: 89.7 fL (ref 80.0–100.0)
Monocytes Absolute: 0.9 K/uL (ref 0.1–1.0)
Monocytes Relative: 11 %
Neutro Abs: 4.8 K/uL (ref 1.7–7.7)
Neutrophils Relative %: 61 %
Platelets: 245 K/uL (ref 150–400)
RBC: 4.17 MIL/uL (ref 3.87–5.11)
RDW: 14 % (ref 11.5–15.5)
WBC: 8 K/uL (ref 4.0–10.5)
nRBC: 0 % (ref 0.0–0.2)

## 2024-04-28 LAB — COMPREHENSIVE METABOLIC PANEL WITH GFR
ALT: 13 U/L (ref 0–44)
AST: 23 U/L (ref 15–41)
Albumin: 3.8 g/dL (ref 3.5–5.0)
Alkaline Phosphatase: 81 U/L (ref 38–126)
Anion gap: 14 (ref 5–15)
BUN: 11 mg/dL (ref 8–23)
CO2: 21 mmol/L — ABNORMAL LOW (ref 22–32)
Calcium: 9.7 mg/dL (ref 8.9–10.3)
Chloride: 102 mmol/L (ref 98–111)
Creatinine, Ser: 0.83 mg/dL (ref 0.44–1.00)
GFR, Estimated: >60 mL/min (ref >60)
Glucose, Bld: 103 mg/dL — ABNORMAL HIGH (ref 70–99)
Potassium: 3.6 mmol/L (ref 3.5–5.1)
Sodium: 137 mmol/L (ref 135–145)
Total Bilirubin: 0.6 mg/dL (ref 0.0–1.2)
Total Protein: 7 g/dL (ref 6.5–8.1)

## 2024-04-28 LAB — CBG MONITORING, ED: Glucose-Capillary: 99 mg/dL (ref 70–99)

## 2024-04-28 LAB — URINALYSIS, ROUTINE W REFLEX MICROSCOPIC
Bilirubin Urine: NEGATIVE
Glucose, UA: NEGATIVE mg/dL
Hgb urine dipstick: NEGATIVE
Ketones, ur: NEGATIVE mg/dL
Leukocytes,Ua: NEGATIVE
Nitrite: NEGATIVE
Protein, ur: NEGATIVE mg/dL
Specific Gravity, Urine: 1.011 (ref 1.005–1.030)
pH: 6 (ref 5.0–8.0)

## 2024-04-28 LAB — D-DIMER, QUANTITATIVE: D-Dimer, Quant: 0.59 ug{FEU}/mL — ABNORMAL HIGH (ref 0.00–0.50)

## 2024-04-28 LAB — TROPONIN I (HIGH SENSITIVITY): Troponin I (High Sensitivity): 5 ng/L (ref <18)

## 2024-04-28 NOTE — ED Notes (Signed)
 Orthostatic Vitals taken, pt tolerated well, denies weakness/ dizziness when lying and sitting. When standing pt initially did well but began getting weak and wanted to sit down. Vitals Charted and labeled.

## 2024-04-28 NOTE — ED Provider Notes (Signed)
 [place] EM[facility] DEPARTMENT AT [facility] Provider Note   CSN: [id number] Arrival date & time: 04/28/24  1453     Patient presents with: Loss of Consciousness  HPI Alexis Ross is a 78 y.o. female with history of hypertension, hyperlipidemia, stroke, hypothyroidism, type 2 diabetes presenting for syncope.  This occurred about an hour and a half ago.  She states she was sitting down in her kitchen watching some TV she tried to stand up and then all she remembers is waking up on the floor.  She denies any preceding chest pain or palpitations or shortness of breath.  She states that just before the incident she started to choke on some water.  She denies any recent nausea vomiting diarrhea, and abdominal pain.  Denies tongue biting or urinary incontinence.     Loss of Consciousness      Prior to Admission medications   Medication Sig Start Date End Date Taking? Authorizing Provider  acetaminophen  (TYLENOL ) 500 MG tablet Take 1,000 mg by mouth every 6 (six) hours as needed. For pain    [provider]  atorvastatin  (LIPITOR) 40 MG tablet Take 1 tablet (40 mg total) by mouth daily at 6 PM. 10/30/23   Tobie Suzzane POUR, MD  clopidogrel  (PLAVIX ) 75 MG tablet Take 1 tablet (75 mg total) by mouth daily with breakfast. 10/30/23   Tobie Suzzane POUR, MD  levothyroxine  (SYNTHROID ) 100 MCG tablet Take 1 tablet (100 mcg total) by mouth daily before breakfast. 10/30/23   Tobie Suzzane POUR, MD  lisinopril -hydrochlorothiazide  (ZESTORETIC ) 10-12.5 MG tablet Take 1 tablet by mouth daily. 10/30/23   Tobie Suzzane POUR, MD  Vitamin D , Ergocalciferol , (DRISDOL ) 1.25 MG (50000 UNIT) CAPS capsule Take 1 capsule by mouth once a week 10/26/22   Tobie Suzzane POUR, MD    Allergies: Patient has no known allergies.    Review of Systems  Cardiovascular:  Positive for syncope.    Updated Vital Signs BP (!) 163/65   Pulse 89   Temp 98.1 F (36.7 C)   Resp 20   Ht 5' 6 (1.676 m)   Wt 87.6 kg    SpO2 98%   BMI 31.17 kg/m   Physical Exam Vitals and nursing note reviewed.  HENT:     Head: Normocephalic and atraumatic. No raccoon eyes or Battle's sign.     Mouth/Throat:     Mouth: Mucous membranes are moist.  Eyes:     General:        Right eye: No discharge.        Left eye: No discharge.     Conjunctiva/sclera: Conjunctivae normal.  Neck:     Comments: No tenderness with palpation of the midline of the posterior neck Cardiovascular:     Rate and Rhythm: Normal rate and regular rhythm.     Pulses: Normal pulses.     Heart sounds: Normal heart sounds.  Pulmonary:     Effort: Pulmonary effort is normal.     Breath sounds: Normal breath sounds.  Abdominal:     General: Abdomen is flat.     Palpations: Abdomen is soft.  Skin:    General: Skin is warm and dry.  Neurological:     General: No focal deficit present.     Comments: GCS 15. Speech is goal oriented. No deficits appreciated to CN III-XII; symmetric eyebrow raise, no facial drooping, tongue midline. Patient has equal grip strength bilaterally with 5/5 strength against resistance in all major muscle groups bilaterally.  Sensation to light touch intact. Patient moves extremities without ataxia. Normal finger-nose-finger. Patient ambulatory with steady gait.   Psychiatric:        Mood and Affect: Mood normal.     (all labs ordered are listed, but only abnormal results are displayed) Labs Reviewed  COMPREHENSIVE METABOLIC PANEL WITH GFR - Abnormal; Notable for the following components:      Result Value   CO2 21 (*)    Glucose, Bld 103 (*)    All other components within normal limits  URINALYSIS, ROUTINE W REFLEX MICROSCOPIC - Abnormal; Notable for the following components:   Color, Urine STRAW (*)    All other components within normal limits  D-DIMER, QUANTITATIVE - Abnormal; Notable for the following components:   D-Dimer, Quant 0.59 (*)    All other components within normal limits  CBC WITH  DIFFERENTIAL/PLATELET - Abnormal; Notable for the following components:   Hemoglobin 11.9 (*)    All other components within normal limits  CBC WITH DIFFERENTIAL/PLATELET  CBG MONITORING, ED  CBG MONITORING, ED  TROPONIN I (HIGH SENSITIVITY)    EKG: EKG Interpretation Date/Time:  Monday April 28 2024 15:16:01 EDT Ventricular Rate:  96 PR Interval:  146 QRS Duration:  100 QT Interval:  348 QTC Calculation: 440 R Axis:   7  Text Interpretation: Sinus rhythm Minimal ST elevation, anterior leads since last tracing no significant change Confirmed by Cleotilde Rogue ([id number]) on 04/28/2024 3:19:30 PM  Radiology: CT Head Wo Contrast Result Date: 04/28/2024 CLINICAL DATA:  Syncope/presyncope, cerebrovascular cause suspected EXAM: CT HEAD WITHOUT CONTRAST TECHNIQUE: Contiguous axial images were obtained from the base of the skull through the vertex without intravenous contrast. RADIATION DOSE REDUCTION: This exam was performed according to the departmental dose-optimization program which includes automated exposure control, adjustment of the mA and/or kV according to patient size and/or use of iterative reconstruction technique. COMPARISON:  CT head 12/19/2013 FINDINGS: Brain: No evidence of large-territorial acute infarction. No parenchymal hemorrhage. No mass lesion. No extra-axial collection. No mass effect or midline shift. No hydrocephalus. Basilar cisterns are patent. Vascular: No hyperdense vessel. Atherosclerotic calcifications are present within the cavernous internal carotid and vertebral arteries. Skull: No acute fracture or focal lesion. Sinuses/Orbits: Paranasal sinuses and mastoid air cells are clear. The orbits are unremarkable. Other: None. IMPRESSION: No acute intracranial abnormality. Electronically Signed   By: Morgane  Naveau M.D.   On: 04/28/2024 15:58   DG Chest Portable 1 View Result Date: 04/28/2024 CLINICAL DATA:  Syncope. EXAM: PORTABLE CHEST 1 VIEW COMPARISON:  December 19, 2013.  FINDINGS: The heart size and mediastinal contours are within normal limits. Both lungs are clear. The visualized skeletal structures are unremarkable. IMPRESSION: No active disease. Electronically Signed   By: Lynwood Landy Raddle M.D.   On: 04/28/2024 15:44     Procedures   Medications Ordered in the ED - No data to display                                  Medical Decision Making Amount and/or Complexity of Data Reviewed Labs: ordered. Radiology: ordered.   Initial Impression and Ddx 78 year old well-appearing female presenting for syncope.  Exam was unremarkable.  DDx includes stroke, PE, ACS, vasovagal syncope, electrolyte derangement, seizure, other. Patient PMH that increases complexity of ED encounter:  history of hypertension, hyperlipidemia, stroke, hypothyroidism, type 2 diabetes   Interpretation of Diagnostics - I independent reviewed and interpreted the labs  as followed: no acute findings  - I independently visualized the following imaging with scope of interpretation limited to determining acute life threatening conditions related to emergency care: CT head, which revealed no acute abnormality, CXR was also non acute  - I personally reviewed and interpreted EKG which revealed sinus rhythm  Patient Reassessment and Ultimate Disposition/Management Overall suspect this was likely vasovagal syncope given that she had what sounds like a choking event with water and then syncopized shortly after.  Consider other causes versus such as cardiogenic but unlikely given reassuring EKG and normal troponin and patient was without associated symptoms.  Seizure is also unlikely given lack of postictal phase and no urinary incontinence or tongue biting.  On reassessment patient was able to ambulate to and from the bathroom.  Advised follow-up with cardiology and her PCP.  Discussed return precautions.  Discharged in good condition.  Patient management required discussion with the following  services or consulting groups:  None  Complexity of Problems Addressed Acute complicated illness or Injury  Additional Data Reviewed and Analyzed Further history obtained from: Further history from spouse/family member, Past medical history and medications listed in the EMR, and Prior ED visit notes  Patient Encounter Risk Assessment Consideration of hospitalization      Final diagnoses:  Syncope, unspecified syncope type    ED Discharge Orders          Ordered    Ambulatory referral to Cardiology       Comments: If you have not heard from the Cardiology office within the next 72 hours please call [phone].   04/28/24 1753               Lang Norleen POUR, PA-C 04/28/24 1754    Patt Alm Macho, MD 04/28/24 2351

## 2024-04-28 NOTE — ED Notes (Signed)
 Patient transported to CT

## 2024-04-28 NOTE — Discharge Instructions (Addendum)
 Evaluation today was overall reassuring.  Please follow-up with cardiology for your passing out.  If if you pass out again, start have chest pain, new visual disturbance, weakness or numbness in your extremities or any other concerning symptom please return to the ED for further evaluation.

## 2024-04-28 NOTE — ED Triage Notes (Signed)
 Pt arrived via POV c/o syncopal episode at home. Pt reports she was sitting down watching my stories and I got up to fetch something to drink from the kitchen. Pt then reports waking up on the floor, soaking wet. Pt denies Hx of seizures. Pt does endorse headache.

## 2024-04-28 NOTE — ED Notes (Signed)
 Patient ambulated with one person, appeared off balance but easily recovered. Complained about foggy vision.

## 2024-05-05 DIAGNOSIS — H25813 Combined forms of age-related cataract, bilateral: Secondary | ICD-10-CM | POA: Diagnosis not present

## 2024-05-23 ENCOUNTER — Other Ambulatory Visit: Payer: Self-pay | Admitting: Internal Medicine

## 2024-05-23 DIAGNOSIS — E039 Hypothyroidism, unspecified: Secondary | ICD-10-CM

## 2024-06-03 DIAGNOSIS — H2513 Age-related nuclear cataract, bilateral: Secondary | ICD-10-CM | POA: Diagnosis not present

## 2024-06-03 DIAGNOSIS — H16223 Keratoconjunctivitis sicca, not specified as Sjogren's, bilateral: Secondary | ICD-10-CM | POA: Diagnosis not present

## 2024-06-03 DIAGNOSIS — H01004 Unspecified blepharitis left upper eyelid: Secondary | ICD-10-CM | POA: Diagnosis not present

## 2024-06-03 DIAGNOSIS — H01001 Unspecified blepharitis right upper eyelid: Secondary | ICD-10-CM | POA: Diagnosis not present

## 2024-06-18 DIAGNOSIS — H2512 Age-related nuclear cataract, left eye: Secondary | ICD-10-CM | POA: Diagnosis not present

## 2024-06-18 NOTE — H&P (Signed)
 Surgical History & Physical  Patient Name: Alexis Ross  DOB: Jul 04, 1946  Surgery: Cataract extraction with intraocular lens implant phacoemulsification; Left Eye Surgeon: Marsa Cleverly MD Surgery Date: 06/27/2024 Pre-Op Date: 06/03/2024  HPI: A 12 Yr. old female patient 1.  The patient is here for a cataract evaluation Ref: Dr. Darroll. Pt. complains of difficulty when reading/viewing blackboard, which began 1 years ago. Both eyes are affected. The episode is gradual. Symptoms occur when the patient is inside and outside. Distance vision is worse than near. The complaint is associated with blurry vision. This This is negatively affecting the patient's quality of life and the patient is unable to function adequately in life with the current level of vision. HPI was performed by Marsa Cleverly .  Medical History:  High Blood Pressure Hypercholesterolemia Thyroid  Problems  Review of Systems Endocrine Hyperthyroidism  Social Never smoked   Medication Prednisolone-moxiflox-bromfen,  Clopidogrel , Atorvastatin , Lisinopril -hydrochlorothiazide , Levothyroxine   Sx/Procedures None  Drug Allergies  NKDA  History & Physical: Heent: cataracts NECK: supple without bruits LUNGS: lungs clear to auscultation CV: regular rate and rhythm Abdomen: soft and non-tender  Impression & Plan: Assessment: 1.  CATARACT NUCLEAR SCLEROSIS AGE RELATED; Both Eyes (H25.13) 2.  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  Cataracts are visually significant and account for the patient's complaints. Discussed all risks, benefits, procedures and recovery, including infection, loss of vision and eye, need for glasses after surgery or additional procedures. Patient understands changing glasses will not improve vision. Patient indicated understanding of procedure.  All questions answered. Patient desires to have surgery, recommend phacoemulsification with intraocular lens. Patient to have preliminary testing necessary (Argos/IOL Master, Mac OCT, TOPO) Educational materials provided:Cataract.  Plan: - Proceed with cataract surgery OS, followed by OD - Plan for best distance target with DIB00 - No DM, no fuchs, no prior eye surgery - moderate dilation in clinic today - Somewhat shallow AC - Dextenza if available  2.  Dry eye. Mild signs of dry eye at this time. Can use artificial tears QID OU PRN and warm compresses once daily as needed.  3.  Blepharitis with telangiectasias and blocked glands. Will use warm compresses once daily long term. Lid scrubs one to two times weekly.  4.  Per Dr. Darroll

## 2024-06-25 ENCOUNTER — Encounter (HOSPITAL_COMMUNITY)
Admission: RE | Admit: 2024-06-25 | Discharge: 2024-06-25 | Disposition: A | Discharge status: Home / Self Care | Source: Ambulatory Visit | Attending: Optometry | Admitting: Optometry

## 2024-06-25 ENCOUNTER — Encounter (HOSPITAL_COMMUNITY): Payer: Self-pay

## 2024-06-27 ENCOUNTER — Ambulatory Visit (HOSPITAL_BASED_OUTPATIENT_CLINIC_OR_DEPARTMENT_OTHER): Admitting: Certified Registered Nurse Anesthetist

## 2024-06-27 ENCOUNTER — Encounter (HOSPITAL_COMMUNITY): Payer: Self-pay | Admitting: Optometry

## 2024-06-27 ENCOUNTER — Encounter (HOSPITAL_COMMUNITY)
Admission: RE | Disposition: A | Discharge status: Home / Self Care | Payer: Self-pay | Source: Home / Self Care | Attending: Optometry

## 2024-06-27 ENCOUNTER — Encounter (HOSPITAL_COMMUNITY): Admitting: Certified Registered Nurse Anesthetist

## 2024-06-27 ENCOUNTER — Other Ambulatory Visit: Payer: Self-pay

## 2024-06-27 ENCOUNTER — Ambulatory Visit (HOSPITAL_COMMUNITY)
Admission: RE | Admit: 2024-06-27 | Discharge: 2024-06-27 | Disposition: A | Discharge status: Home / Self Care | Attending: Optometry | Admitting: Optometry

## 2024-06-27 DIAGNOSIS — Z8673 Personal history of transient ischemic attack (TIA), and cerebral infarction without residual deficits: Secondary | ICD-10-CM | POA: Diagnosis not present

## 2024-06-27 DIAGNOSIS — I10 Essential (primary) hypertension: Secondary | ICD-10-CM | POA: Diagnosis not present

## 2024-06-27 DIAGNOSIS — M1990 Unspecified osteoarthritis, unspecified site: Secondary | ICD-10-CM | POA: Diagnosis not present

## 2024-06-27 DIAGNOSIS — H01004 Unspecified blepharitis left upper eyelid: Secondary | ICD-10-CM | POA: Diagnosis not present

## 2024-06-27 DIAGNOSIS — E1136 Type 2 diabetes mellitus with diabetic cataract: Secondary | ICD-10-CM | POA: Insufficient documentation

## 2024-06-27 DIAGNOSIS — E039 Hypothyroidism, unspecified: Secondary | ICD-10-CM

## 2024-06-27 DIAGNOSIS — H2513 Age-related nuclear cataract, bilateral: Secondary | ICD-10-CM | POA: Insufficient documentation

## 2024-06-27 DIAGNOSIS — Z87891 Personal history of nicotine dependence: Secondary | ICD-10-CM | POA: Insufficient documentation

## 2024-06-27 DIAGNOSIS — M3501 Sicca syndrome with keratoconjunctivitis: Secondary | ICD-10-CM | POA: Diagnosis not present

## 2024-06-27 DIAGNOSIS — H2512 Age-related nuclear cataract, left eye: Secondary | ICD-10-CM | POA: Diagnosis not present

## 2024-06-27 DIAGNOSIS — H5712 Ocular pain, left eye: Secondary | ICD-10-CM | POA: Diagnosis not present

## 2024-06-27 DIAGNOSIS — H01001 Unspecified blepharitis right upper eyelid: Secondary | ICD-10-CM | POA: Diagnosis not present

## 2024-06-27 DIAGNOSIS — H5213 Myopia, bilateral: Secondary | ICD-10-CM | POA: Diagnosis not present

## 2024-06-27 HISTORY — PX: INSERTION, STENT, DRUG-ELUTING, LACRIMAL CANALICULUS: SHX7453

## 2024-06-27 HISTORY — PX: CATARACT EXTRACTION W/PHACO: SHX586

## 2024-06-27 SURGERY — PHACOEMULSIFICATION, CATARACT, WITH IOL INSERTION
Anesthesia: Monitor Anesthesia Care | Site: Eye | Laterality: Left

## 2024-06-27 MED ORDER — LIDOCAINE HCL (PF) 1 % IJ SOLN
INTRAMUSCULAR | Status: DC | PRN
  Administered 2024-06-27: 1 mL

## 2024-06-27 MED ORDER — STERILE WATER FOR IRRIGATION IR SOLN
Status: DC | PRN
  Administered 2024-06-27: 10 mL

## 2024-06-27 MED ORDER — TETRACAINE HCL 0.5 % OP SOLN
1.0000 [drp] | OPHTHALMIC | Status: AC | PRN
  Administered 2024-06-27 (×3): 1 [drp] via OPHTHALMIC

## 2024-06-27 MED ORDER — FENTANYL CITRATE (PF) 100 MCG/2ML IJ SOLN
INTRAMUSCULAR | Status: AC
  Filled 2024-06-27: qty 2

## 2024-06-27 MED ORDER — PHENYLEPHRINE HCL 2.5 % OP SOLN
1.0000 [drp] | OPHTHALMIC | Status: AC | PRN
  Administered 2024-06-27 (×3): 1 [drp] via OPHTHALMIC

## 2024-06-27 MED ORDER — POVIDONE-IODINE 5 % OP SOLN
OPHTHALMIC | Status: DC | PRN
  Administered 2024-06-27: 1 via OPHTHALMIC

## 2024-06-27 MED ORDER — MOXIFLOXACIN HCL 5 MG/ML IO SOLN
INTRAOCULAR | Status: DC | PRN
  Administered 2024-06-27: .3 mL via INTRACAMERAL

## 2024-06-27 MED ORDER — LIDOCAINE HCL 3.5 % OP GEL
1.0000 | Freq: Once | OPHTHALMIC | Status: AC
  Administered 2024-06-27: 1 via OPHTHALMIC

## 2024-06-27 MED ORDER — PHENYLEPHRINE-KETOROLAC 1-0.3 % IO SOLN
INTRAOCULAR | Status: DC | PRN
  Administered 2024-06-27: 500 mL via OPHTHALMIC

## 2024-06-27 MED ORDER — BSS IO SOLN
INTRAOCULAR | Status: DC | PRN
  Administered 2024-06-27: 15 mL via INTRAOCULAR

## 2024-06-27 MED ORDER — TROPICAMIDE 1 % OP SOLN
1.0000 [drp] | OPHTHALMIC | Status: AC | PRN
  Administered 2024-06-27 (×3): 1 [drp] via OPHTHALMIC

## 2024-06-27 MED ORDER — SIGHTPATH DOSE#1 NA HYALUR & NA CHOND-NA HYALUR IO KIT
PACK | INTRAOCULAR | Status: DC | PRN
  Administered 2024-06-27: 1 via OPHTHALMIC

## 2024-06-27 MED ORDER — DEXAMETHASONE 0.4 MG OP INST
VAGINAL_INSERT | OPHTHALMIC | Status: DC | PRN
  Administered 2024-06-27: .4 mg via OPHTHALMIC

## 2024-06-27 MED ORDER — DEXAMETHASONE 0.4 MG OP INST
VAGINAL_INSERT | OPHTHALMIC | Status: AC
  Filled 2024-06-27: qty 1

## 2024-06-27 SURGICAL SUPPLY — 12 items
CLOTH BEACON ORANGE TIMEOUT ST (SAFETY) ×1 IMPLANT
DRSG TEGADERM 4X4.75 (GAUZE/BANDAGES/DRESSINGS) ×1 IMPLANT
EYE SHIELD UNIVERSAL CLEAR (GAUZE/BANDAGES/DRESSINGS) IMPLANT
FEE CATARACT SUITE SIGHTPATH (MISCELLANEOUS) ×1 IMPLANT
GLOVE BIOGEL PI IND STRL 7.0 (GLOVE) ×2 IMPLANT
LENS IOL TECNIS EYHANCE 22.0 (Intraocular Lens) IMPLANT
NDL HYPO 18GX1.5 BLUNT FILL (NEEDLE) ×1 IMPLANT
NEEDLE HYPO 18GX1.5 BLUNT FILL (NEEDLE) ×1 IMPLANT
PAD ARMBOARD POSITIONER FOAM (MISCELLANEOUS) ×1 IMPLANT
SYR TB 1ML LL NO SAFETY (SYRINGE) ×1 IMPLANT
TAPE SURG TRANSPORE 1 IN (GAUZE/BANDAGES/DRESSINGS) IMPLANT
WATER STERILE IRR 250ML POUR (IV SOLUTION) ×1 IMPLANT

## 2024-06-27 NOTE — Discharge Instructions (Signed)
 Please discharge patient when stable, will follow up today with Dr. Ilsa Iha at the [facility] office immediately following discharge.  Leave shield in place until visit.  All paperwork with discharge instructions will be given at the office.  [facility] Address:  [address]  [place], [place] [address]  Dr. Chaya Jan Phone: [phone]

## 2024-06-27 NOTE — Interval H&P Note (Signed)
 History and Physical Interval Note:  06/27/2024 9:43 AM  The H and P was reviewed and updated. The patient was examined.  No changes were found after exam.  The surgical eye was marked.  Alexis Ross

## 2024-06-27 NOTE — Op Note (Signed)
 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 Item Serial No. Manufacturer Lot No. LRB No. Used Action  LENS IOL TECNIS EYHANCE 22.0 - [id number] Intraocular Lens LENS IOL TECNIS EYHANCE 22.0 [id number] [employer]IGHT[employer]  Left 1 Implanted    Indications:  Visually significant age-related cataract, Left Eye  Procedure:  The patient was seen and identified in the pre-operative area. The operative eye was identified and dilated.  The operative eye was marked.  Topical anesthesia was administered to the operative eye.     The patient was then to the operative suite and placed in the supine position.  A timeout was performed confirming the patient, procedure to be performed, and all other relevant information.   The patient's face was prepped and draped in the usual fashion for intra-ocular surgery.  A lid speculum was placed into the operative eye and the surgical microscope moved into place and focused.  An inferotemporal paracentesis was created using a 20 gauge paracentesis blade.  BSS mixed with Omidria , followed by 1% lidocaine  was injected into the anterior chamber.  Viscoelastic was injected into the anterior chamber.  A temporal clear-corneal main wound incision was created using a 2.63mm microkeratome.  A continuous curvilinear capsulorrhexis was initiated using an irrigating cystitome and  completed using capsulorrhexis forceps.  Hydrodissection and hydrodeliniation were performed.  Viscoelastic was injected into the anterior chamber.  A phacoemulsification handpiece and a chopper as a second instrument were used to remove the nucleus and epinucleus. The irrigation/aspiration handpiece was used to remove any remaining cortical material.   The capsular bag was reinflated with viscoelastic, checked, and found to be intact.  The intraocular lens was inserted into the capsular bag.  The irrigation/aspiration handpiece was used to remove any remaining viscoelastic.  The clear corneal wound and paracentesis wounds were then hydrated and checked with Weck-Cels to be watertight. Moxifloxacin  was instilled into the anterior chamber.  The lid-speculum and drape were removed. The lower punctum was dilated, and the dextenza  implant was inserted into it. The patient's face was cleaned with a wet and dry 4x4.  A clear shield was taped over the eye. The patient was taken to the post-operative care unit in good condition, having tolerated the procedure well.  Post-Op Instructions: The patient will follow up at [facility] for a same day post-operative evaluation and will receive all other orders and instructions.

## 2024-06-27 NOTE — Anesthesia Postprocedure Evaluation (Signed)
 Anesthesia Post Note  Patient: Alexis Ross  Procedure(s) Performed: PHACOEMULSIFICATION, CATARACT, WITH IOL INSERTION (Left: Eye) INSERTION, STENT, DRUG-ELUTING, LACRIMAL CANALICULUS (Left: Eye)  Patient location during evaluation: PACU Anesthesia Type: MAC Level of consciousness: awake and alert Pain management: pain level controlled Vital Signs Assessment: post-procedure vital signs reviewed and stable Respiratory status: spontaneous breathing, nonlabored ventilation, respiratory function stable and patient connected to nasal cannula oxygen Cardiovascular status: stable and blood pressure returned to baseline Postop Assessment: no apparent nausea or vomiting Anesthetic complications: no   No notable events documented.   Last Vitals:  Vitals:   06/27/24 0926 06/27/24 1045  BP: 134/69 (!) 143/70  Pulse: 70 62  Resp: 14 17  Temp: 36.6 C 36.6 C  SpO2: 100% 100%    Last Pain:  Vitals:   06/27/24 1045  TempSrc: Oral  PainSc: 0-No pain                 Andrea Limes

## 2024-06-27 NOTE — Transfer of Care (Signed)
 Immediate Anesthesia Transfer of Care Note  Patient: Alexis Ross  Procedure(s) Performed: PHACOEMULSIFICATION, CATARACT, WITH IOL INSERTION (Left: Eye) INSERTION, STENT, DRUG-ELUTING, LACRIMAL CANALICULUS (Left: Eye)  Patient Location: Short Stay  Anesthesia Type:MAC  Level of Consciousness: awake, alert , and oriented  Airway & Oxygen Therapy: Patient Spontanous Breathing  Post-op Assessment: Report given to RN, Post -op Vital signs reviewed and stable, Patient moving all extremities X 4, and Patient able to stick tongue midline  Post vital signs: Reviewed and stable  Last Vitals:  Vitals Value Taken Time  BP 143/70 06/27/24 10:45  Temp 36.6 C 06/27/24 10:45  Pulse 62 06/27/24 10:45  Resp 17 06/27/24 10:45  SpO2 100 % 06/27/24 10:45    Last Pain:  Vitals:   06/27/24 1045  TempSrc: Oral  PainSc: 0-No pain         Complications: No notable events documented.

## 2024-06-27 NOTE — Anesthesia Preprocedure Evaluation (Addendum)
 Anesthesia Evaluation  Patient identified by MRN, date of birth, ID band Patient awake    Reviewed: Allergy & Precautions, H&P , NPO status , Patient's Chart, lab work & pertinent test results  Airway Mallampati: II  TM Distance: >3 FB Neck ROM: Full    Dental  (+) Upper Dentures   Pulmonary former smoker   Pulmonary exam normal breath sounds clear to auscultation       Cardiovascular hypertension, Normal cardiovascular exam Rhythm:Regular Rate:Normal     Neuro/Psych CVA  negative psych ROS   GI/Hepatic negative GI ROS, Neg liver ROS,,,  Endo/Other  diabetesHypothyroidism    Renal/GU negative Renal ROS  negative genitourinary   Musculoskeletal  (+) Arthritis ,    Abdominal   Peds negative pediatric ROS (+)  Hematology  (+) Blood dyscrasia, anemia   Anesthesia Other Findings   Reproductive/Obstetrics negative OB ROS                              Anesthesia Physical Anesthesia Plan  ASA: 2  Anesthesia Plan: MAC   Post-op Pain Management:    Induction:   PONV Risk Score and Plan:   Airway Management Planned: Nasal Cannula  Additional Equipment:   Intra-op Plan:   Post-operative Plan:   Informed Consent: I have reviewed the patients History and Physical, chart, labs and discussed the procedure including the risks, benefits and alternatives for the proposed anesthesia with the patient or authorized representative who has indicated his/her understanding and acceptance.     Dental advisory given  Plan Discussed with: CRNA  Anesthesia Plan Comments:          Anesthesia Quick Evaluation

## 2024-06-30 ENCOUNTER — Encounter (HOSPITAL_COMMUNITY): Payer: Self-pay | Admitting: Optometry

## 2024-06-30 DIAGNOSIS — E1169 Type 2 diabetes mellitus with other specified complication: Secondary | ICD-10-CM | POA: Diagnosis not present

## 2024-07-01 DIAGNOSIS — H2511 Age-related nuclear cataract, right eye: Secondary | ICD-10-CM | POA: Diagnosis not present

## 2024-07-01 LAB — BASIC METABOLIC PANEL WITH GFR
BUN/Creatinine Ratio: 13 (ref 12–28)
BUN: 12 mg/dL (ref 8–27)
CO2: 24 mmol/L (ref 20–29)
Calcium: 10.1 mg/dL (ref 8.7–10.3)
Chloride: 103 mmol/L (ref 96–106)
Creatinine, Ser: 0.92 mg/dL (ref 0.57–1.00)
Glucose: 86 mg/dL (ref 70–99)
Potassium: 4.7 mmol/L (ref 3.5–5.2)
Sodium: 140 mmol/L (ref 134–144)
eGFR: 63 mL/min/1.73 (ref >59)

## 2024-07-01 LAB — HEMOGLOBIN A1C
Est. average glucose Bld gHb Est-mCnc: 140 mg/dL
Hgb A1c MFr Bld: 6.5 % — ABNORMAL HIGH (ref 4.8–5.6)

## 2024-07-01 LAB — TSH+FREE T4
Free T4: 1.17 ng/dL (ref 0.82–1.77)
TSH: 0.475 u[IU]/mL (ref 0.450–4.500)

## 2024-07-04 ENCOUNTER — Ambulatory Visit: Admitting: Internal Medicine

## 2024-07-04 ENCOUNTER — Encounter: Payer: Self-pay | Admitting: Internal Medicine

## 2024-07-04 VITALS — BP 129/71 | HR 64 | Ht 66.0 in | Wt 190.2 lb

## 2024-07-04 DIAGNOSIS — Z23 Encounter for immunization: Secondary | ICD-10-CM

## 2024-07-04 DIAGNOSIS — E039 Hypothyroidism, unspecified: Secondary | ICD-10-CM | POA: Diagnosis not present

## 2024-07-04 DIAGNOSIS — I10 Essential (primary) hypertension: Secondary | ICD-10-CM | POA: Diagnosis not present

## 2024-07-04 DIAGNOSIS — E782 Mixed hyperlipidemia: Secondary | ICD-10-CM

## 2024-07-04 DIAGNOSIS — E1169 Type 2 diabetes mellitus with other specified complication: Secondary | ICD-10-CM | POA: Diagnosis not present

## 2024-07-04 DIAGNOSIS — Z8673 Personal history of transient ischemic attack (TIA), and cerebral infarction without residual deficits: Secondary | ICD-10-CM

## 2024-07-04 MED ORDER — SPIKEVAX 50 MCG/0.5ML IM SUSY: 0.5000 mL | PREFILLED_SYRINGE | Freq: Once | INTRAMUSCULAR | 0 refills | Status: AC

## 2024-07-04 NOTE — Assessment & Plan Note (Signed)
No focal neurologic deficits On Plavix and statin 

## 2024-07-04 NOTE — Assessment & Plan Note (Signed)
 BP Readings from Last 1 Encounters:  07/04/24 129/71   Overall well-controlled with Lisinopril -HCTZ Home BP readings reviewed ~ 130s/60s Counseled for compliance with the medications Advised DASH diet and moderate exercise/walking, at least 150 mins/week

## 2024-07-04 NOTE — Assessment & Plan Note (Signed)
 Lab Results  Component Value Date   HGBA1C 6.5 (H) 06/30/2024   Diet controlled Associated with HTN and HLD Diet modification was advised, plan to start Metformin if HbA1C gets near or above 7 (due to her age) Advised to follow diabetic diet On ACEi and statin F/u CMP and lipid panel Diabetic eye exam: Advised to follow up with Ophthalmology for diabetic eye exam

## 2024-07-04 NOTE — Assessment & Plan Note (Signed)
 On Atorvastatin  40 mg QD LDL not at goal, needs to follow low cholesterol diet - will keep same dose of Lipitor for now considering her age

## 2024-07-04 NOTE — Assessment & Plan Note (Signed)
 Lab Results  Component Value Date   TSH 0.475 06/30/2024   Free T4 wnl On Levothyroxine  100 mcg once daily, will keep same dose for now Check TSH and free T4

## 2024-07-04 NOTE — Progress Notes (Signed)
 Established Patient Office Visit  Subjective:  Patient ID: Alexis Ross, female    DOB: January 30, 1946  Age: 78 y.o. MRN: [id number]  CC:  Chief Complaint  Patient presents with   Diabetes    4 month f/u    Hypothyroidism    4 month f/u    HPI Alexis Ross is a 78 y.o. female with past medical history of HTN, CVA and HLD who presents for annual physical.  HTN: Her BP was wnl today. She has brought BP readings from home, which ranges around 130s/60s. She denies any headache, dizziness, chest pain, dyspnea or palpitations.  Hypothyroidism: TSH and free T4 level are wnl now. She takes levothyroxine  100 mcg QD.  Denies any constipation, diarrhea, recent weight or appetite change or recent skin or nail changes.  CVA: Takes Plavix  and statin. Denies any focal numbness or weakness.  Type II DM with HLD: Diet controlled. Her last HbA1C is 6.5 now. She has cut down sweet intake.  She denies any polyuria or polyphagia currently.  Neck mass: She had noticed a neck mass on the posterior side in 09/24, which was hot initially and has been regressing in size. It appears smaller now than prior. Denies any recent URTI symptoms.  Denies any local rash.  Past Medical History:  Diagnosis Date   Accidental fall 12/19/2013   tripped over pallet in store; briefly lost consciousness (12/20/2013)   Anemia    Arthritis    knees, fingers (12/19/2013)   Closed fracture of facial bones (HCC) 12/19/2013   Concussion with brief LOC 12/19/2013   Head injury, closed, with concussion 12/19/2013   Hyperlipidemia    Hypothyroidism    Stroke (HCC) 12/2011   residual left sided weakness (12/19/2013)    Past Surgical History:  Procedure Laterality Date   CATARACT EXTRACTION W/PHACO Left 06/27/2024   Procedure: PHACOEMULSIFICATION, CATARACT, WITH IOL INSERTION;  Surgeon: Juli Blunt, MD;  Location: [facility];  Service: Ophthalmology;  Laterality: Left;  CDE: 5.17   INSERTION, STENT, DRUG-ELUTING,  LACRIMAL CANALICULUS Left 06/27/2024   Procedure: INSERTION, STENT, DRUG-ELUTING, LACRIMAL CANALICULUS;  Surgeon: Juli Blunt, MD;  Location: [facility];  Service: Ophthalmology;  Laterality: Left;   NO PAST SURGERIES      History reviewed. No pertinent family history.  Social History   Socioeconomic History   Marital status: Widowed    Spouse name: Not on file   Number of children: 4   Years of education: Not on file   Highest education level: 11th grade  Occupational History   Not on file  Tobacco Use   Smoking status: Former    Current packs/day: 0.50    Average packs/day: 0.5 packs/day for 30.0 years (15.0 ttl pk-yrs)    Types: Cigarettes   Smokeless tobacco: Never   Tobacco comments:    12/19/2013 quit smoking in ~ 1994  Vaping Use   Vaping status: Never Used  Substance and Sexual Activity   Alcohol use: Yes    Comment: 12/19/2013 drank a little when I was young   Drug use: No   Sexual activity: Yes  Other Topics Concern   Not on file  Social History Narrative   Lives alone   4 children: Dickey Orie Jackee [place]       Enjoys: church, relaxing, read, watch tv      Diet: eats all foods groups   Caffeine: 1-2 cups coffee   Water : 6-8 cups daily      Wears seat belt-does not  drive    Smoke detectors    No weapons   Social Drivers of Health   Financial Resource Strain: Low Risk  (12/24/2023)   Overall Financial Resource Strain (CARDIA)    Difficulty of Paying Living Expenses: Not hard at all  Food Insecurity: No Food Insecurity (12/24/2023)   Hunger Vital Sign    Worried About Running Out of Food in the Last Year: Never true    Ran Out of Food in the Last Year: Never true  Transportation Needs: No Transportation Needs (12/24/2023)   PRAPARE - Administrator, Civil Service (Medical): No    Lack of Transportation (Non-Medical): No  Physical Activity: Sufficiently Active (12/24/2023)   Exercise Vital Sign    Days of Exercise per Week: 7 days     Minutes of Exercise per Session: 30 min  Stress: No Stress Concern Present (12/24/2023)   [employer] of Occupational Health - Occupational Stress Questionnaire    Feeling of Stress : Not at all  Social Connections: Moderately Integrated (12/24/2023)   Social Connection and Isolation Panel    Frequency of Communication with Friends and Family: More than three times a week    Frequency of Social Gatherings with Friends and Family: More than three times a week    Attends Religious Services: More than 4 times per year    Active Member of [employer] or Organizations: Yes    Attends Banker Meetings: More than 4 times per year    Marital Status: Widowed  Intimate Partner Violence: Not At Risk (12/24/2023)   Humiliation, Afraid, Rape, and Kick questionnaire    Fear of Current or Ex-Partner: No    Emotionally Abused: No    Physically Abused: No    Sexually Abused: No    Outpatient Medications Prior to Visit  Medication Sig Dispense Refill   acetaminophen  (TYLENOL ) 500 MG tablet Take 1,000 mg by mouth every 6 (six) hours as needed. For pain     atorvastatin  (LIPITOR) 40 MG tablet Take 1 tablet (40 mg total) by mouth daily at 6 PM. 90 tablet 3   clopidogrel  (PLAVIX ) 75 MG tablet Take 1 tablet (75 mg total) by mouth daily with breakfast. 90 tablet 3   levothyroxine  (SYNTHROID ) 100 MCG tablet TAKE 1 TABLET BY MOUTH ONCE DAILY BEFORE BREAKFAST 90 tablet 0   lisinopril -hydrochlorothiazide  (ZESTORETIC ) 10-12.5 MG tablet Take 1 tablet by mouth daily. 90 tablet 3   Vitamin D , Ergocalciferol , (DRISDOL ) 1.25 MG (50000 UNIT) CAPS capsule Take 1 capsule by mouth once a week 12 capsule 1   No facility-administered medications prior to visit.    No Known Allergies  ROS Review of Systems  Constitutional:  Negative for chills and fever.  HENT:  Negative for congestion, sinus pressure, sinus pain and sore throat.   Eyes:  Negative for pain and discharge.  Respiratory:  Negative for cough and  shortness of breath.   Cardiovascular:  Negative for chest pain and palpitations.  Gastrointestinal:  Negative for abdominal pain, diarrhea, nausea and vomiting.  Endocrine: Negative for polydipsia and polyuria.  Genitourinary:  Negative for dysuria and hematuria.  Musculoskeletal:  Negative for neck pain and neck stiffness.  Skin:  Negative for rash.  Neurological:  Negative for dizziness and weakness.  Psychiatric/Behavioral:  Negative for agitation and behavioral problems.       Objective:    Physical Exam Vitals reviewed.  Constitutional:      General: She is not in acute distress.  Appearance: She is not diaphoretic.  HENT:     Head: Normocephalic and atraumatic.     Nose: Nose normal. No congestion.     Mouth/Throat:     Mouth: Mucous membranes are moist.     Pharynx: No posterior oropharyngeal erythema.  Eyes:     General: No scleral icterus.    Extraocular Movements: Extraocular movements intact.  Neck:     Comments: Neck mass over right posterior side, about 1.5 cm in diameter, nontender - likely lipoma Cardiovascular:     Rate and Rhythm: Normal rate and regular rhythm.     Heart sounds: Normal heart sounds. No murmur heard. Pulmonary:     Breath sounds: Normal breath sounds. No wheezing or rales.  Abdominal:     Palpations: Abdomen is soft.     Tenderness: There is no abdominal tenderness.  Musculoskeletal:     Cervical back: Neck supple. No tenderness.     Right lower leg: No edema.     Left lower leg: No edema.  Skin:    General: Skin is warm.     Findings: No rash.  Neurological:     General: No focal deficit present.     Mental Status: She is alert and oriented to person, place, and time.     Cranial Nerves: No cranial nerve deficit.     Sensory: No sensory deficit.     Motor: No weakness.  Psychiatric:        Mood and Affect: Mood normal.        Behavior: Behavior normal.     BP 129/71   Pulse 64   Ht 5' 6 (1.676 m)   Wt 190 lb 3.2 oz  (86.3 kg)   SpO2 99%   BMI 30.70 kg/m  Wt Readings from Last 3 Encounters:  07/04/24 190 lb 3.2 oz (86.3 kg)  06/27/24 190 lb (86.2 kg)  06/25/24 190 lb 0.6 oz (86.2 kg)    Lab Results  Component Value Date   TSH 0.475 06/30/2024   Lab Results  Component Value Date   WBC 8.0 04/28/2024   HGB 11.9 (L) 04/28/2024   HCT 37.4 04/28/2024   MCV 89.7 04/28/2024   PLT 245 04/28/2024   Lab Results  Component Value Date   NA 140 06/30/2024   K 4.7 06/30/2024   CO2 24 06/30/2024   GLUCOSE 86 06/30/2024   BUN 12 06/30/2024   CREATININE 0.92 06/30/2024   BILITOT 0.6 04/28/2024   ALKPHOS 81 04/28/2024   AST 23 04/28/2024   ALT 13 04/28/2024   PROT 7.0 04/28/2024   ALBUMIN 3.8 04/28/2024   CALCIUM  10.1 06/30/2024   ANIONGAP 14 04/28/2024   EGFR 63 06/30/2024   Lab Results  Component Value Date   CHOL 187 02/28/2024   Lab Results  Component Value Date   HDL 62 02/28/2024   Lab Results  Component Value Date   LDLCALC 108 (H) 02/28/2024   Lab Results  Component Value Date   TRIG 92 02/28/2024   Lab Results  Component Value Date   CHOLHDL 3.0 02/28/2024   Lab Results  Component Value Date   HGBA1C 6.5 (H) 06/30/2024      Assessment & Plan:   Problem List Items Addressed This Visit       Cardiovascular and Mediastinum   Essential hypertension   BP Readings from Last 1 Encounters:  07/04/24 129/71   Overall well-controlled with Lisinopril -HCTZ Home BP readings reviewed ~ 130s/60s Counseled for compliance with  the medications Advised DASH diet and moderate exercise/walking, at least 150 mins/week        Endocrine   Hypothyroidism   Lab Results  Component Value Date   TSH 0.475 06/30/2024   Free T4 wnl On Levothyroxine  100 mcg once daily, will keep same dose for now Check TSH and free T4      Type 2 Diabetes mellitus with other specified complication - Primary   Lab Results  Component Value Date   HGBA1C 6.5 (H) 06/30/2024   Diet  controlled Associated with HTN and HLD Diet modification was advised, plan to start Metformin if HbA1C gets near or above 7 (due to her age) Advised to follow diabetic diet On ACEi and statin F/u CMP and lipid panel Diabetic eye exam: Advised to follow up with Ophthalmology for diabetic eye exam        Other   Hyperlipidemia   On Atorvastatin  40 mg QD LDL not at goal, needs to follow low cholesterol diet - will keep same dose of Lipitor for now considering her age      History of CVA (cerebrovascular accident)   No focal neurologic deficits On Plavix  and statin      Other Visit Diagnoses       Encounter for immunization       Relevant Orders   Flu vaccine HIGH DOSE PF(Fluzone Trivalent) (Completed)     Need for COVID-19 vaccine       Relevant Medications   COVID-19 mRNA vaccine (SPIKEVAX ) syringe          Meds ordered this encounter  Medications   COVID-19 mRNA vaccine (SPIKEVAX ) syringe    Sig: Inject 0.5 mLs into the muscle once for 1 dose. Okay to administer Pfizer vaccine if Moderna is not available.    Dispense:  0.5 mL    Refill:  0    Follow-up: Return in about 4 months (around 11/03/2024) for DM and hypothyroidism.    Suzzane MARLA Blanch, MD

## 2024-07-04 NOTE — Patient Instructions (Addendum)
 Please schedule Bone Density appt.  Please continue to take medications as prescribed.  Please continue to follow low carb diet and perform moderate exercise/walking as tolerated.

## 2024-07-07 ENCOUNTER — Encounter (HOSPITAL_COMMUNITY): Payer: Self-pay

## 2024-07-07 ENCOUNTER — Encounter (HOSPITAL_COMMUNITY)
Admission: RE | Admit: 2024-07-07 | Discharge: 2024-07-07 | Disposition: A | Discharge status: Home / Self Care | Source: Ambulatory Visit | Attending: Optometry | Admitting: Optometry

## 2024-07-09 NOTE — H&P (Signed)
 Surgical History & Physical  Patient Name: Alexis Ross  DOB: 1946/07/18  Surgery: Cataract extraction with intraocular lens implant phacoemulsification; Right Eye Surgeon: Marsa Cleverly MD Surgery Date: 07/11/2024 Pre-Op Date: 07/01/2024  HPI: A 8 Yr. old female patient present for 4 day post op OS. Patient is doing well. Using Combo TID OS. Difficulties reading, watching TV, and reading road signs. This is negatively affecting the patient's quality of life and the patient is unable to function adequately in life with the current level of vision. Patient would like to proceed with cataract sx OD. HPI was performed by Marsa Cleverly .  Medical History:  High Blood Pressure Hypercholesterolemia Thyroid  Problems  Review of Systems Endocrine Hyperthyroidism  Social Never smoked   Medication Prednisolone-moxiflox-bromfen,  Clopidogrel , Atorvastatin , Lisinopril -hydrochlorothiazide , Levothyroxine   Sx/Procedures Phaco c IOL OS - no dextenza   Drug Allergies  NKDA  History & Physical: Heent: cataract NECK: supple without bruits LUNGS: lungs clear to auscultation CV: regular rate and rhythm Abdomen: soft and non-tender  Impression & Plan: Assessment: 1.  CATARACT NUCLEAR SCLEROSIS AGE RELATED; , Right Eye (H25.11) 2.  CATARACT EXTRACTION STATUS; Left Eye (Z98.42) 3.  INTRAOCULAR LENS IOL (Z96.1)  Plan: 1.  Cataracts are visually significant and account for the patient's complaints. Discussed all risks, benefits, procedures and recovery, including infection, loss of vision and eye, need for glasses after surgery or additional procedures. Patient understands changing glasses will not improve vision. Patient indicated understanding of procedure. All questions answered. Patient desires to have surgery, recommend phacoemulsification with intraocular lens. Patient to have preliminary testing necessary ([employer]/IOL Master, Mac OCT, TOPO) Educational materials  provided:Cataract.  Plan: - Proceed with cataract surgery OD when ready - Plan for best distance target with DIB00 - No DM, no fuchs, no prior eye surgery - moderate dilation in clinic today - Somewhat shallow AC - No Dextenza  OD  2.  CE/PCIOL OS 06/27/24 POD4 exam Doing well. All post-op precautions discussed and instructions reviewed. Written instructions given.  3.  Continue observaion

## 2024-07-11 ENCOUNTER — Encounter (HOSPITAL_COMMUNITY)
Admission: RE | Disposition: A | Discharge status: Home / Self Care | Payer: Self-pay | Source: Home / Self Care | Attending: Optometry

## 2024-07-11 ENCOUNTER — Ambulatory Visit (HOSPITAL_COMMUNITY): Admitting: Anesthesiology

## 2024-07-11 ENCOUNTER — Ambulatory Visit (HOSPITAL_COMMUNITY)
Admission: RE | Admit: 2024-07-11 | Discharge: 2024-07-11 | Disposition: A | Discharge status: Home / Self Care | Attending: Optometry | Admitting: Optometry

## 2024-07-11 ENCOUNTER — Encounter (HOSPITAL_COMMUNITY): Payer: Self-pay | Admitting: Optometry

## 2024-07-11 ENCOUNTER — Other Ambulatory Visit: Payer: Self-pay

## 2024-07-11 DIAGNOSIS — E039 Hypothyroidism, unspecified: Secondary | ICD-10-CM

## 2024-07-11 DIAGNOSIS — Z87891 Personal history of nicotine dependence: Secondary | ICD-10-CM | POA: Diagnosis not present

## 2024-07-11 DIAGNOSIS — I10 Essential (primary) hypertension: Secondary | ICD-10-CM | POA: Insufficient documentation

## 2024-07-11 DIAGNOSIS — H5711 Ocular pain, right eye: Secondary | ICD-10-CM | POA: Insufficient documentation

## 2024-07-11 DIAGNOSIS — E119 Type 2 diabetes mellitus without complications: Secondary | ICD-10-CM | POA: Insufficient documentation

## 2024-07-11 DIAGNOSIS — Z79899 Other long term (current) drug therapy: Secondary | ICD-10-CM | POA: Insufficient documentation

## 2024-07-11 DIAGNOSIS — M1990 Unspecified osteoarthritis, unspecified site: Secondary | ICD-10-CM | POA: Insufficient documentation

## 2024-07-11 DIAGNOSIS — Z79890 Hormone replacement therapy (postmenopausal): Secondary | ICD-10-CM | POA: Insufficient documentation

## 2024-07-11 DIAGNOSIS — D649 Anemia, unspecified: Secondary | ICD-10-CM | POA: Diagnosis not present

## 2024-07-11 DIAGNOSIS — H2511 Age-related nuclear cataract, right eye: Secondary | ICD-10-CM

## 2024-07-11 DIAGNOSIS — E1136 Type 2 diabetes mellitus with diabetic cataract: Secondary | ICD-10-CM | POA: Diagnosis not present

## 2024-07-11 HISTORY — PX: CATARACT EXTRACTION W/PHACO: SHX586

## 2024-07-11 SURGERY — PHACOEMULSIFICATION, CATARACT, WITH IOL INSERTION
Anesthesia: Monitor Anesthesia Care | Site: Eye | Laterality: Right

## 2024-07-11 MED ORDER — MIDAZOLAM HCL 2 MG/2ML IJ SOLN
INTRAMUSCULAR | Status: DC | PRN
  Administered 2024-07-11: 1 mg via INTRAVENOUS

## 2024-07-11 MED ORDER — MOXIFLOXACIN HCL 5 MG/ML IO SOLN
INTRAOCULAR | Status: DC | PRN
  Administered 2024-07-11: .3 mL via INTRACAMERAL

## 2024-07-11 MED ORDER — STERILE WATER FOR IRRIGATION IR SOLN
Status: DC | PRN
  Administered 2024-07-11: 1000 mL

## 2024-07-11 MED ORDER — TETRACAINE HCL 0.5 % OP SOLN
1.0000 [drp] | OPHTHALMIC | Status: AC
  Administered 2024-07-11 (×3): 1 [drp] via OPHTHALMIC

## 2024-07-11 MED ORDER — SODIUM CHLORIDE 0.9% FLUSH
INTRAVENOUS | Status: DC | PRN
  Administered 2024-07-11: 3 mL via INTRAVENOUS

## 2024-07-11 MED ORDER — PHENYLEPHRINE HCL 2.5 % OP SOLN
1.0000 [drp] | OPHTHALMIC | Status: AC
  Administered 2024-07-11 (×3): 1 [drp] via OPHTHALMIC

## 2024-07-11 MED ORDER — BSS IO SOLN
INTRAOCULAR | Status: DC | PRN
  Administered 2024-07-11: 15 mL via INTRAOCULAR

## 2024-07-11 MED ORDER — MIDAZOLAM HCL 2 MG/2ML IJ SOLN
INTRAMUSCULAR | Status: AC
  Filled 2024-07-11: qty 2

## 2024-07-11 MED ORDER — SIGHTPATH DOSE#1 NA HYALUR & NA CHOND-NA HYALUR IO KIT
PACK | INTRAOCULAR | Status: DC | PRN
  Administered 2024-07-11: 1 via OPHTHALMIC

## 2024-07-11 MED ORDER — LACTATED RINGERS IV SOLN
INTRAVENOUS | Status: DC
Start: 2024-07-11 — End: 2024-07-11

## 2024-07-11 MED ORDER — PHENYLEPHRINE-KETOROLAC 1-0.3 % IO SOLN
INTRAOCULAR | Status: DC | PRN
  Administered 2024-07-11: 500 mL via OPHTHALMIC

## 2024-07-11 MED ORDER — DEXAMETHASONE 0.4 MG OP INST
VAGINAL_INSERT | OPHTHALMIC | Status: AC
  Filled 2024-07-11: qty 1

## 2024-07-11 MED ORDER — TROPICAMIDE 1 % OP SOLN
1.0000 [drp] | OPHTHALMIC | Status: AC
  Administered 2024-07-11 (×3): 1 [drp] via OPHTHALMIC

## 2024-07-11 MED ORDER — LIDOCAINE HCL (PF) 1 % IJ SOLN
INTRAMUSCULAR | Status: DC | PRN
  Administered 2024-07-11: 1 mL

## 2024-07-11 MED ORDER — LIDOCAINE HCL 3.5 % OP GEL
1.0000 | Freq: Once | OPHTHALMIC | Status: AC
  Administered 2024-07-11: 1 via OPHTHALMIC

## 2024-07-11 MED ORDER — POVIDONE-IODINE 5 % OP SOLN
OPHTHALMIC | Status: DC | PRN
  Administered 2024-07-11: 1 via OPHTHALMIC

## 2024-07-11 SURGICAL SUPPLY — 12 items
CLOTH BEACON ORANGE TIMEOUT ST (SAFETY) ×2 IMPLANT
DRSG TEGADERM 4X4.75 (GAUZE/BANDAGES/DRESSINGS) ×2 IMPLANT
EYE SHIELD UNIVERSAL CLEAR (GAUZE/BANDAGES/DRESSINGS) ×1 IMPLANT
FEE CATARACT SUITE SIGHTPATH (MISCELLANEOUS) ×2 IMPLANT
GLOVE BIOGEL PI IND STRL 7.0 (GLOVE) ×4 IMPLANT
LENS IOL TECNIS EYHANCE 22.5 (Intraocular Lens) ×1 IMPLANT
NDL HYPO 18GX1.5 BLUNT FILL (NEEDLE) ×1 IMPLANT
NEEDLE HYPO 18GX1.5 BLUNT FILL (NEEDLE) ×2 IMPLANT
PAD ARMBOARD POSITIONER FOAM (MISCELLANEOUS) ×2 IMPLANT
SYR TB 1ML LL NO SAFETY (SYRINGE) ×2 IMPLANT
TAPE SURG TRANSPORE 1 IN (GAUZE/BANDAGES/DRESSINGS) ×1 IMPLANT
WATER STERILE IRR 250ML POUR (IV SOLUTION) ×2 IMPLANT

## 2024-07-11 NOTE — Op Note (Signed)
 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 Item Serial No. Manufacturer Lot No. LRB No. Used Action  LENS IOL TECNIS EYHANCE 22.5 - [id number] Intraocular Lens LENS IOL TECNIS EYHANCE 22.5 [id number] [employer]  Right 1 Implanted    Indications:  Visually significant age-related cataract, Right Eye  Procedure:  The patient was seen and identified in the pre-operative area. The operative eye was identified and dilated.  The operative eye was marked.  Topical anesthesia was administered to the operative eye.     The patient was then to the operative suite and placed in the supine position.  A timeout was performed confirming the patient, procedure to be performed, and all other relevant information.   The patient's face was prepped and draped in the usual fashion for intra-ocular surgery.  A lid speculum was placed into the operative eye and the surgical microscope moved into place and focused.  A superotemporal paracentesis was created using a 20 gauge paracentesis blade.  BSS mixed with O[place] , followed by 1% lidocaine  was injected into the anterior chamber.  Viscoelastic was injected into the anterior chamber.  A temporal clear-corneal main wound incision was created using a 2.62mm microkeratome.  A continuous curvilinear capsulorrhexis was initiated using an irrigating cystitome and completed using capsulorrhexis forceps.  Hydrodissection and hydrodeliniation were performed.  Viscoelastic  was injected into the anterior chamber.  A phacoemulsification handpiece and a chopper as a second instrument were used to remove the nucleus and epinucleus. The irrigation/aspiration handpiece was used to remove any remaining cortical material.   The capsular bag was reinflated with viscoelastic, checked, and found to be intact.  The intraocular lens was inserted into the capsular bag.  The irrigation/aspiration handpiece was used to remove any remaining viscoelastic.  The clear corneal wound and paracentesis wounds were then hydrated and checked with Weck-Cels to be watertight. Moxifloxacin  was instilled into the anterior chamber.  The lid-speculum and drape were removed. A clear shield was taped over the eye. The patient was taken to the post-operative care unit in good condition, having tolerated the procedure well.  Post-Op Instructions: The patient will follow up at [facility] for a same day post-operative evaluation and will receive all other orders and instructions.

## 2024-07-11 NOTE — Anesthesia Postprocedure Evaluation (Signed)
 Anesthesia Post Note  Patient: Alexis Ross  Procedure(s) Performed: PHACOEMULSIFICATION, CATARACT, WITH IOL INSERTION (Right: Eye)  Patient location during evaluation: Phase II Anesthesia Type: MAC Level of consciousness: awake Pain management: pain level controlled Vital Signs Assessment: post-procedure vital signs reviewed and stable Respiratory status: spontaneous breathing and respiratory function stable Cardiovascular status: blood pressure returned to baseline and stable Postop Assessment: no headache and no apparent nausea or vomiting Anesthetic complications: no Comments: Late entry   No notable events documented.   Last Vitals:  Vitals:   07/11/24 0750 07/11/24 0853  BP: 93/65 136/69  Pulse: 60 (!) 57  Resp: 15 19  Temp: 36.6 C 36.5 C  SpO2: 100% 99%    Last Pain:  Vitals:   07/11/24 0853  TempSrc: Oral  PainSc: 0-No pain                 Yvonna JINNY Bosworth

## 2024-07-11 NOTE — Interval H&P Note (Signed)
 History and Physical Interval Note:  07/11/2024 8:03 AM  The H and P was reviewed and updated. The patient was examined.  No changes were found after exam.  The surgical eye was marked.  Malika Demario

## 2024-07-11 NOTE — Interval H&P Note (Signed)
 History and Physical Interval Note:  07/11/2024 7:56 AM  The H and P was reviewed and updated. The patient was examined.  No changes were found after exam.  The surgical eye was marked.  Alexis Ross

## 2024-07-11 NOTE — Anesthesia Preprocedure Evaluation (Signed)
 Anesthesia Evaluation  Patient identified by MRN, date of birth, ID band Patient awake    Reviewed: Allergy & Precautions, H&P , NPO status , Patient's Chart, lab work & pertinent test results  Airway Mallampati: II  TM Distance: >3 FB Neck ROM: Full    Dental  (+) Upper Dentures   Pulmonary former smoker   Pulmonary exam normal breath sounds clear to auscultation       Cardiovascular hypertension, Normal cardiovascular exam Rhythm:Regular Rate:Normal     Neuro/Psych CVA  negative psych ROS   GI/Hepatic negative GI ROS, Neg liver ROS,,,  Endo/Other  diabetesHypothyroidism    Renal/GU negative Renal ROS  negative genitourinary   Musculoskeletal  (+) Arthritis ,    Abdominal   Peds negative pediatric ROS (+)  Hematology  (+) Blood dyscrasia, anemia   Anesthesia Other Findings   Reproductive/Obstetrics negative OB ROS                              Anesthesia Physical Anesthesia Plan  ASA: 2  Anesthesia Plan: MAC   Post-op Pain Management:    Induction:   PONV Risk Score and Plan:   Airway Management Planned: Nasal Cannula  Additional Equipment:   Intra-op Plan:   Post-operative Plan:   Informed Consent: I have reviewed the patients History and Physical, chart, labs and discussed the procedure including the risks, benefits and alternatives for the proposed anesthesia with the patient or authorized representative who has indicated his/her understanding and acceptance.     Dental advisory given  Plan Discussed with: CRNA  Anesthesia Plan Comments:          Anesthesia Quick Evaluation

## 2024-07-11 NOTE — Discharge Instructions (Addendum)
 Please discharge patient when stable, will follow up today with Dr. Ilsa Iha at the [facility] office immediately following discharge.  Leave shield in place until visit.  All paperwork with discharge instructions will be given at the office.  [facility] Address:  [address]  [place], [place] [address]  Dr. Chaya Jan Phone: [phone]

## 2024-07-11 NOTE — Transfer of Care (Signed)
 Immediate Anesthesia Transfer of Care Note  Patient: Rollene DELENA Blush  Procedure(s) Performed: PHACOEMULSIFICATION, CATARACT, WITH IOL INSERTION (Right: Eye)  Patient Location: Short Stay  Anesthesia Type:MAC  Level of Consciousness: awake, alert , oriented, and patient cooperative  Airway & Oxygen Therapy: Patient Spontanous Breathing  Post-op Assessment: Report given to RN, Post -op Vital signs reviewed and stable, and Patient moving all extremities X 4  Post vital signs: Reviewed and stable  Last Vitals:  Vitals Value Taken Time  BP 136/69 07/11/24 08:53  Temp 36.5 C 07/11/24 08:53  Pulse 57 07/11/24 08:53  Resp 19 07/11/24 08:53  SpO2 99 % 07/11/24 08:53    Last Pain:  Vitals:   07/11/24 0853  TempSrc: Oral  PainSc: 0-No pain      Patients Stated Pain Goal: 4 (07/11/24 0750)  Complications: No notable events documented.

## 2024-08-12 ENCOUNTER — Ambulatory Visit: Payer: Self-pay | Admitting: Internal Medicine

## 2024-08-12 ENCOUNTER — Ambulatory Visit (HOSPITAL_COMMUNITY)
Admission: RE | Admit: 2024-08-12 | Discharge: 2024-08-12 | Disposition: A | Discharge status: Home / Self Care | Source: Ambulatory Visit | Attending: Internal Medicine | Admitting: Internal Medicine

## 2024-08-12 DIAGNOSIS — Z780 Asymptomatic menopausal state: Secondary | ICD-10-CM | POA: Insufficient documentation

## 2024-08-12 DIAGNOSIS — M8580 Other specified disorders of bone density and structure, unspecified site: Secondary | ICD-10-CM | POA: Diagnosis present

## 2024-08-19 ENCOUNTER — Other Ambulatory Visit: Payer: Self-pay | Admitting: Internal Medicine

## 2024-08-19 DIAGNOSIS — E039 Hypothyroidism, unspecified: Secondary | ICD-10-CM

## 2024-08-20 ENCOUNTER — Ambulatory Visit: Admitting: Cardiology

## 2024-08-21 ENCOUNTER — Ambulatory Visit: Admitting: Cardiology

## 2024-10-23 ENCOUNTER — Other Ambulatory Visit: Payer: Self-pay | Admitting: Internal Medicine

## 2024-10-23 DIAGNOSIS — I10 Essential (primary) hypertension: Secondary | ICD-10-CM

## 2024-11-04 ENCOUNTER — Ambulatory Visit: Admitting: Internal Medicine

## 2024-11-15 ENCOUNTER — Other Ambulatory Visit: Payer: Self-pay | Admitting: Internal Medicine

## 2024-11-15 DIAGNOSIS — E039 Hypothyroidism, unspecified: Secondary | ICD-10-CM

## 2024-12-17 ENCOUNTER — Ambulatory Visit: Admitting: Internal Medicine

## 2024-12-24 ENCOUNTER — Ambulatory Visit
